# Patient Record
Sex: Male | Born: 1935 | Race: White | Hispanic: No | Marital: Married | Smoking: Never smoker
Health system: Southern US, Community
[De-identification: ages and names within clinical notes are randomized; demographics above are authoritative.]

## PROBLEM LIST (undated history)

## (undated) DIAGNOSIS — J929 Pleural plaque without asbestos: Secondary | ICD-10-CM

## (undated) DIAGNOSIS — E538 Deficiency of other specified B group vitamins: Secondary | ICD-10-CM

## (undated) DIAGNOSIS — M1990 Unspecified osteoarthritis, unspecified site: Secondary | ICD-10-CM

## (undated) DIAGNOSIS — G20A1 Parkinson's disease without dyskinesia, without mention of fluctuations: Secondary | ICD-10-CM

## (undated) DIAGNOSIS — G2581 Restless legs syndrome: Secondary | ICD-10-CM

## (undated) DIAGNOSIS — E785 Hyperlipidemia, unspecified: Secondary | ICD-10-CM

## (undated) DIAGNOSIS — J849 Interstitial pulmonary disease, unspecified: Secondary | ICD-10-CM

## (undated) DIAGNOSIS — K219 Gastro-esophageal reflux disease without esophagitis: Secondary | ICD-10-CM

## (undated) DIAGNOSIS — G20 Parkinson's disease: Secondary | ICD-10-CM

## (undated) DIAGNOSIS — R4189 Other symptoms and signs involving cognitive functions and awareness: Secondary | ICD-10-CM

## (undated) DIAGNOSIS — R413 Other amnesia: Secondary | ICD-10-CM

## (undated) DIAGNOSIS — F513 Sleepwalking [somnambulism]: Principal | ICD-10-CM

## (undated) HISTORY — DX: Parkinson's disease without dyskinesia, without mention of fluctuations: G20.A1

## (undated) HISTORY — PX: COLONOSCOPY: SHX174

## (undated) HISTORY — DX: Deficiency of other specified B group vitamins: E53.8

## (undated) HISTORY — DX: Pleural plaque without asbestos: J92.9

## (undated) HISTORY — DX: Unspecified osteoarthritis, unspecified site: M19.90

## (undated) HISTORY — DX: Hyperlipidemia, unspecified: E78.5

## (undated) HISTORY — DX: Gastro-esophageal reflux disease without esophagitis: K21.9

## (undated) HISTORY — DX: Sleepwalking (somnambulism): F51.3

## (undated) HISTORY — DX: Other amnesia: R41.3

## (undated) HISTORY — DX: Parkinson's disease: G20

## (undated) HISTORY — DX: Other symptoms and signs involving cognitive functions and awareness: R41.89

## (undated) HISTORY — DX: Interstitial pulmonary disease, unspecified: J84.9

## (undated) HISTORY — DX: Restless legs syndrome: G25.81

---

## 1960-10-02 HISTORY — PX: LUNG DECORTICATION: SHX454

## 1999-01-14 ENCOUNTER — Emergency Department (HOSPITAL_COMMUNITY): Admission: EM | Admit: 1999-01-14 | Discharge: 1999-01-14 | Payer: Self-pay | Admitting: Endocrinology

## 2001-10-16 ENCOUNTER — Encounter (INDEPENDENT_AMBULATORY_CARE_PROVIDER_SITE_OTHER): Payer: Self-pay

## 2001-10-16 ENCOUNTER — Ambulatory Visit (HOSPITAL_COMMUNITY): Admission: RE | Admit: 2001-10-16 | Discharge: 2001-10-16 | Payer: Self-pay | Admitting: Gastroenterology

## 2004-11-25 ENCOUNTER — Encounter (INDEPENDENT_AMBULATORY_CARE_PROVIDER_SITE_OTHER): Payer: Self-pay | Admitting: *Deleted

## 2004-11-25 ENCOUNTER — Ambulatory Visit (HOSPITAL_COMMUNITY): Admission: RE | Admit: 2004-11-25 | Discharge: 2004-11-25 | Payer: Self-pay | Admitting: Gastroenterology

## 2006-01-31 ENCOUNTER — Encounter: Payer: Self-pay | Admitting: Endocrinology

## 2006-01-31 ENCOUNTER — Encounter: Payer: Self-pay | Admitting: Emergency Medicine

## 2006-02-26 ENCOUNTER — Emergency Department (HOSPITAL_COMMUNITY): Admission: EM | Admit: 2006-02-26 | Discharge: 2006-02-26 | Payer: Self-pay | Admitting: *Deleted

## 2007-01-07 ENCOUNTER — Ambulatory Visit: Payer: Self-pay | Admitting: Vascular Surgery

## 2008-03-02 HISTORY — PX: CATARACT EXTRACTION: SUR2

## 2008-06-17 ENCOUNTER — Encounter: Payer: Self-pay | Admitting: Cardiovascular Disease

## 2009-05-13 ENCOUNTER — Inpatient Hospital Stay (HOSPITAL_COMMUNITY): Admission: AD | Admit: 2009-05-13 | Discharge: 2009-05-26 | Payer: Self-pay | Admitting: Internal Medicine

## 2009-05-13 ENCOUNTER — Ambulatory Visit: Payer: Self-pay | Admitting: Emergency Medicine

## 2009-05-18 ENCOUNTER — Encounter: Payer: Self-pay | Admitting: Cardiovascular Disease

## 2009-05-18 ENCOUNTER — Encounter: Payer: Self-pay | Admitting: Pulmonary Disease

## 2009-06-01 DIAGNOSIS — J309 Allergic rhinitis, unspecified: Secondary | ICD-10-CM | POA: Insufficient documentation

## 2009-06-01 DIAGNOSIS — K219 Gastro-esophageal reflux disease without esophagitis: Secondary | ICD-10-CM

## 2009-06-01 DIAGNOSIS — E785 Hyperlipidemia, unspecified: Secondary | ICD-10-CM

## 2009-06-01 DIAGNOSIS — M1990 Unspecified osteoarthritis, unspecified site: Secondary | ICD-10-CM | POA: Insufficient documentation

## 2009-06-01 DIAGNOSIS — J8410 Pulmonary fibrosis, unspecified: Secondary | ICD-10-CM

## 2009-06-01 DIAGNOSIS — G2581 Restless legs syndrome: Secondary | ICD-10-CM

## 2009-06-02 ENCOUNTER — Ambulatory Visit: Payer: Self-pay | Admitting: Pulmonary Disease

## 2009-06-02 DIAGNOSIS — J8409 Other alveolar and parieto-alveolar conditions: Secondary | ICD-10-CM

## 2009-06-18 ENCOUNTER — Telehealth: Payer: Self-pay | Admitting: Pulmonary Disease

## 2009-06-21 ENCOUNTER — Telehealth: Payer: Self-pay | Admitting: Pulmonary Disease

## 2009-06-30 ENCOUNTER — Ambulatory Visit: Payer: Self-pay | Admitting: Pulmonary Disease

## 2009-06-30 DIAGNOSIS — R059 Cough, unspecified: Secondary | ICD-10-CM | POA: Insufficient documentation

## 2009-06-30 DIAGNOSIS — R05 Cough: Secondary | ICD-10-CM

## 2009-07-02 ENCOUNTER — Encounter: Payer: Self-pay | Admitting: Pulmonary Disease

## 2009-07-21 ENCOUNTER — Telehealth: Payer: Self-pay | Admitting: Pulmonary Disease

## 2009-07-26 ENCOUNTER — Telehealth: Payer: Self-pay | Admitting: Pulmonary Disease

## 2009-08-30 ENCOUNTER — Ambulatory Visit: Payer: Self-pay | Admitting: Pulmonary Disease

## 2009-08-30 DIAGNOSIS — G4700 Insomnia, unspecified: Secondary | ICD-10-CM

## 2009-11-02 ENCOUNTER — Telehealth (INDEPENDENT_AMBULATORY_CARE_PROVIDER_SITE_OTHER): Payer: Self-pay | Admitting: *Deleted

## 2009-11-03 ENCOUNTER — Telehealth (INDEPENDENT_AMBULATORY_CARE_PROVIDER_SITE_OTHER): Payer: Self-pay | Admitting: *Deleted

## 2009-11-29 ENCOUNTER — Ambulatory Visit: Payer: Self-pay | Admitting: Pulmonary Disease

## 2010-05-30 ENCOUNTER — Ambulatory Visit: Payer: Self-pay | Admitting: Pulmonary Disease

## 2010-07-04 ENCOUNTER — Telehealth (INDEPENDENT_AMBULATORY_CARE_PROVIDER_SITE_OTHER): Payer: Self-pay | Admitting: *Deleted

## 2010-11-03 NOTE — Progress Notes (Signed)
Summary: congestion moving to chest  Phone Note Call from Patient Call back at Home Phone [phone]   Caller: Patient Call For: clance Reason for Call: Talk to Nurse Summary of Call: cold moving to chest, cough with light yellow plegm.  head congestion. Initial call taken by: Eugene Gavia,  November 03, 2009 11:32 AM  Follow-up for Phone Call        see phone note from 11/02/09. Pt states today his cough is productive with  phlegm that is yellow/green. He is using Chlorpheniramine and sinus rinses since yesterday. I advised pt to give thsi more time, but he wanted to get [facility] recs. Will forward to [facility]. Please advise. Carron Curie CMA  November 03, 2009 11:45 AM   Additional Follow-up for Phone Call Additional follow up Details #1::        If he feels that congestion is worsening, and mucus quantity increasing, would call in omnicef 300mg  2 tabs each am for 7 days.  no fills Additional Follow-up by: Barbaraann Share MD,  November 03, 2009 4:57 PM    Additional Follow-up for Phone Call Additional follow up Details #2::    called spoke with patient, who stated that he actually feels better than he did this morning: his congestion has lessened and mucus is now a lighter shade of yellow.  i advised patient of KC's recs and asked him if he would like me to call in the abx to have on hold in case he worsens.  pt stated that he would.  i reitterated to patient to take the abx if his congestion worsens or the amount of mucus increases.  pt verbalized his understanding.  gave verbal order to pharmacist for [facility] with a request to hold it for patient in case he needs it. Follow-up by: Boone Master CNA,  November 03, 2009 5:16 PM  New/Updated Medications: CEFDINIR 300 MG CAPS (CEFDINIR) 2 capsules by mouth  every morning x7 days Prescriptions: CEFDINIR 300 MG CAPS (CEFDINIR) 2 capsules by mouth  every morning x7 days  #14 x 0   Entered by:   Boone Master CNA   Authorized by:   Barbaraann Share MD   Signed by:   Boone Master CNA on 11/03/2009   Method used:   Telephoned to ...       [facility]. [phone]* (retail)       [phone]       [place], [place]  [address]       Ph: [phone]       Fax: [phone]   RxID:   [id number]

## 2010-11-03 NOTE — Progress Notes (Signed)
Summary: chest and head congestion  Phone Note Call from Patient   Caller: Patient Call For: clance Summary of Call: pt have chest and head congestion taking mucinex want to know if there is anythingelse he should be doing Initial call taken by: Rickard Patience,  November 02, 2009 9:00 AM  Follow-up for Phone Call        called and spoke with pt. pt states he started getting a "cold" on Sunday.  Pt states he is taking Mucinex and Alkaseltzer Plus but is coughing up large amounts of clear sputum.  Pt believes this sputum is coming from sinus drainage and not chest.  Pt denied fever or increased sob.  I offered pt an appt today with [facility] at noon.  Pt delined stating he wanted to get [facility] opinion first before making an appt.  Will forward to [facility] to address. Arman Filter LPN  November 02, 2009 9:09 AM   Additional Follow-up for Phone Call Additional follow up Details #1::        PER KC,  can do sinus rinses two times a day and can continue Alkaseltzer Plus because it has an antihistamine (chlorpheniramine) in it or can stop Alkaseltzer plus and just get Chlorpheniramine 8mg  tabs at bedtime OTC.  Pt states he will stop the Alkaseltzer plus and just get Chlorpheniramine 8mg  OTC and will also do sinus rinses.  Pt was instructed to call our office back if symptoms do not improve over the next few days,  symptoms worsen, or if sputum turns green/yellow.  Pt verbalized understanding and denied any questions. Marland KitchenArman Filter LPN  November 02, 2009 9:32 AM

## 2010-11-03 NOTE — Assessment & Plan Note (Signed)
Summary: rov for pulmonary infiltrates   Primary Provider/Referring Provider:  [name]  CC:  Pt is here for a 4 month f/u appt.  Pt states he tried increasing Requip to 1 1/2 tabs but states he went back to 1 tab daily and denied any restless leg symptoms.  Pt states he finished Prednisone taper.  Pt states occ sob with exertion.  Pt states coughing up clear sputum first thing in the am.   .  History of Present Illness: the pt comes in today for f/u of his aveolar pneumopathy superimposed over known chronic lung disease.  He has been tapered off prednisone, and feels that he is maintaining a stable baseline.  He has minimal cough, and although his exertional tolerance is not back to the level before acute event, it is getting there.  He denies any cough or congestion of signficance at this time.  Current Medications (verified): 1)  Aspirin 81 Mg Tbec (Aspirin) .Marland Kitchen.. 1 Once Daily 2)  Tylenol 325 Mg Tabs (Acetaminophen) .... As Directed As Needed 3)  Requip 1 Mg Tabs (Ropinirole Hcl) .Marland Kitchen.. 1 At Bedtime 4)  Vitamin D (Ergocalciferol) 50000 Unit Caps (Ergocalciferol) .Marland Kitchen.. 1 By Mouth Every Week 5)  Flonase 50 Mcg/act Susp (Fluticasone Propionate) .... 2 Sprays Each Nostril At Bedtime  Allergies (verified): 1)  ! Cipro 2)  ! Sulfa 3)  ! * Tussionex  Review of Systems      See HPI  Vital Signs:  Patient profile:   77 year old male Height:      66 inches Weight:      180.50 pounds BMI:     29.24 O2 Sat:      95 % on Room air Temp:     97.6 degrees F oral Pulse rate:   65 / minute BP sitting:   110 / 70  (left arm) Cuff size:   regular  Vitals Entered By: Arman Filter LPN (November 29, 2009 9:51 AM)  O2 Flow:  Room air CC: Pt is here for a 4 month f/u appt.  Pt states he tried increasing Requip to 1 1/2 tabs but states he went back to 1 tab daily and denied any restless leg symptoms.  Pt states he finished Prednisone taper.  Pt states occ sob with exertion.  Pt states coughing  up clear sputum first thing in the am.    Comments Medications reviewed with patient Arman Filter LPN  November 29, 2009 9:51 AM    Physical Exam  General:  ow male in nad Lungs:  left lung clear, right with scattered crackles (chronic) Heart:  rrr Extremities:  no significant edema.   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  His exertional tolerance is not quite back to baseline, but nearing.  He is not coughing, and is off prednisone.  At this point, I have asked him to work on weight loss, and some type of exercise program.  Will see him back in 6mos.  Other Orders: Est. Patient Level II (84132)  Patient Instructions: 1)  followup with me in 6mos.

## 2010-11-03 NOTE — Assessment & Plan Note (Signed)
Summary: rov for inflammatory lung disease   Primary Wesley Pierce/Referring Wesley Pierce:  Wesley Pierce  CC:  Pt is here for a 6 month f/u appt.  Pt states breathing has improved since last visit. Pt states he is able to "hold out a note longer when he is singing."  Pt states mostly non-productive cough but will occ cough up small amounts of clear sputum.  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  He has been treated with a slow prednisone taper, but has been off the medication for some time now.  He feels that he is getting stronger, and clearly has seen an improvement in his endurance.  He has minimal cough, and when it occurs he produces scant clear mucus.  He is happy with his recent progress.  Current Medications (verified): 1)  Aspirin 81 Mg Tbec (Aspirin) .Wesley Pierce.. 1 Once Daily 2)  Tylenol 325 Mg Tabs (Acetaminophen) .... As Directed As Needed 3)  Requip 1 Mg Tabs (Ropinirole Hcl) .Wesley Pierce.. 1 At Bedtime 4)  Vitamin D (Ergocalciferol) 50000 Unit Caps (Ergocalciferol) .Wesley Pierce.. 1 By Mouth Every Week 5)  Flonase 50 Mcg/act Susp (Fluticasone Propionate) .... 2 Sprays Each Nostril At Bedtime 6)  Otc Stool Softner .... Take 1 Tablet By Mouth Once A Day  Allergies (verified): 1)  ! Cipro 2)  ! Sulfa 3)  ! * Tussionex  Review of Systems       The patient complains of productive cough, non-productive cough, and nasal congestion/difficulty breathing through nose.  The patient denies shortness of breath with activity, shortness of breath at rest, coughing up blood, chest pain, irregular heartbeats, acid heartburn, indigestion, loss of appetite, weight change, abdominal pain, difficulty swallowing, sore throat, tooth/dental problems, headaches, sneezing, itching, ear ache, anxiety, depression, hand/feet swelling, joint stiffness or pain, rash, change in color of mucus, and fever.    Vital  Signs:  Patient profile:   77 year old male Height:      66 inches Weight:      178 pounds BMI:     28.83 O2 Sat:      97 % on Room air Temp:     97.5 degrees F oral Pulse rate:   68 / minute BP sitting:   120 / 80  (right arm) Cuff size:   regular  Vitals Entered By: Arman Filter LPN (May 30, 2010 9:31 AM)  O2 Flow:  Room air CC: Pt is here for a 6 month f/u appt.  Pt states breathing has improved since last visit. Pt states he is able to "hold out a note longer when he is singing."  Pt states mostly non-productive cough but will occ cough up small amounts of clear sputum.  Pt c/o nasal congestion. Comments Medications reviewed with patient Arman Filter LPN  May 30, 2010 9:31 AM    Physical Exam  General:  ow male in nad Lungs:  faint right basilar crackles, otherwise clear. Heart:  rrr, 2/6 sem Extremities:  no edema or cyanosis Neurologic:  alert and oriented, moves all 4.   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  Hopefully, this was a one time event, and will not recur.  I have asked him to continue to be active, and to work on some type of conditioning program as much as he can.  He is to followup with me one more time in a year to check on his progress, but understands he is to call if worsening symptoms.  Medications Added to Medication List This Visit: 1)  Otc Stool Softner  .... Take 1 tablet by mouth once a day  Other Orders: Est. Patient Level III ([address])  Patient Instructions: 1)  followup with me in one year. 2)  stay as active as possible.

## 2010-11-03 NOTE — Progress Notes (Signed)
Summary: increased SOB and cough---rx for [facility]  Phone Note Call from Patient Call back at Home Phone [phone]   Caller: Patient Call For: clance Summary of Call: pt is having a little bit of sob and wanted you to know he also got his pnemonia shot last week Initial call taken by: Lacinda Axon,  July 04, 2010 8:55 AM  Follow-up for Phone Call        Called and spoke with pt.  He c/o increased SOB x 1 wk- esp with singing and wih some heavy exertion.  He states that he has also had more cough over the past 2 wks- produces pale yellow sputum every am and then clear small amounts of sputum throughout the day.  He states that sometimes when he is talking alot he "gets two times a day globs of muceus that fills up my mouth".  No openings in [facility] sched until 07/13/10.  Pls advise thanks! Follow-up by: Vernie Murders,  July 04, 2010 9:17 AM  Additional Follow-up for Phone Call Additional follow up Details #1::        lets treat this as a developing bronchitis would call in omnicef 300mg   2 each am for 5 days Additional Follow-up by: Barbaraann Share MD,  July 04, 2010 5:42 PM    Additional Follow-up for Phone Call Additional follow up Details #2::    called and spoke with pt.  pt aware rx sent to pharmacy. Aundra Millet Reynolds LPN  July 04, 2010 5:46 PM   New/Updated Medications: CEFDINIR 300 MG CAPS (CEFDINIR) take 2 tabs by mouth each morning x 5 days Prescriptions: CEFDINIR 300 MG CAPS (CEFDINIR) take 2 tabs by mouth each morning x 5 days  #10 x 0   Entered by:   Arman Filter LPN   Authorized by:   Barbaraann Share MD   Signed by:   Arman Filter LPN on 09/81/1914   Method used:   Electronically to        [facility]. [phone]* (retail)       [phone]       [place], [place]  [address]       Ph: [phone]       Fax: [phone]   RxID:   [id number]

## 2010-12-18 IMAGING — CR DG CHEST 2V
2 series · 2 of 2 positions shown · non-contrast
Comparison: 05/14/2009 and 05/17/2009

CLINICAL DATA: Shortness of breath.

CHEST - 2 VIEW

[w chest pa]
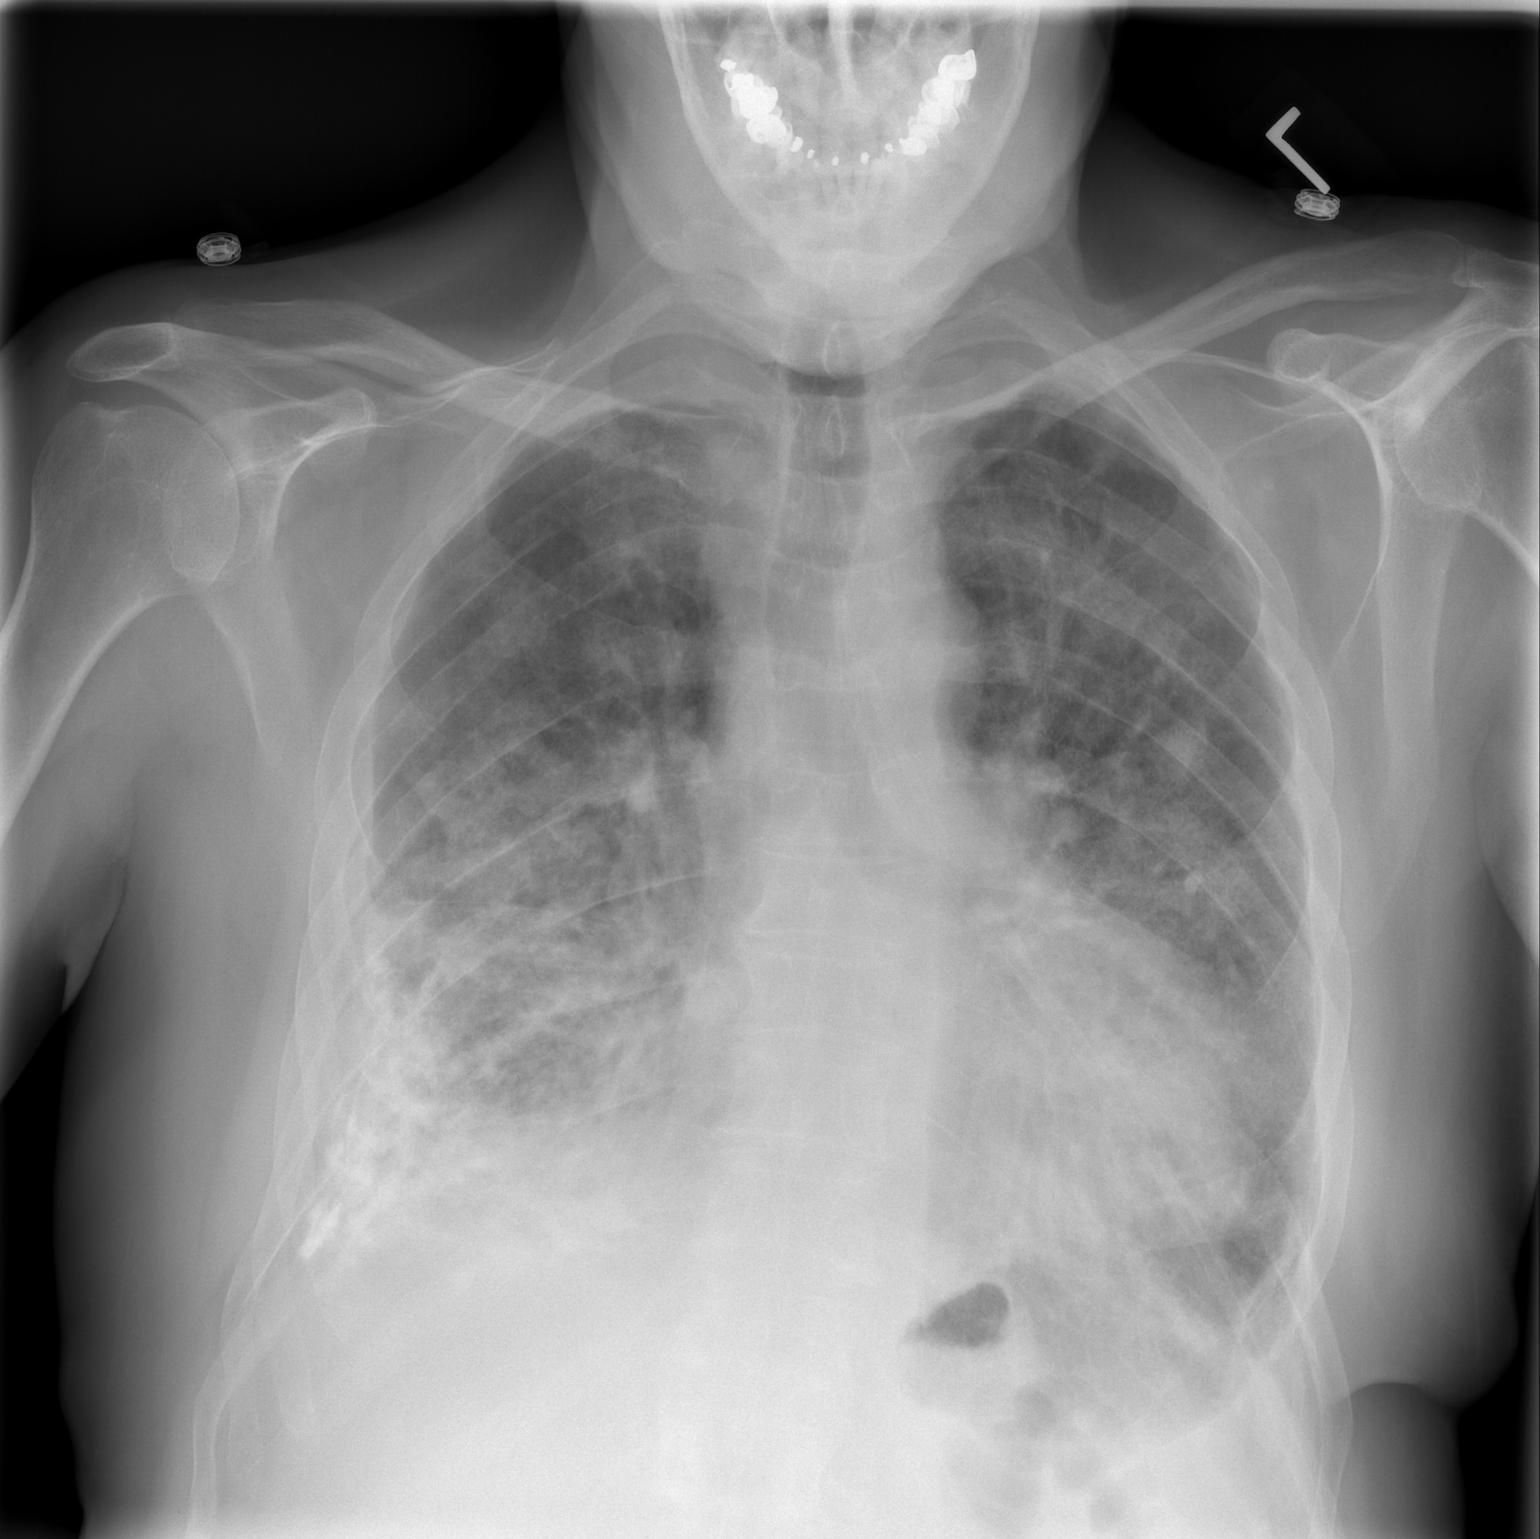

[w chest lat]
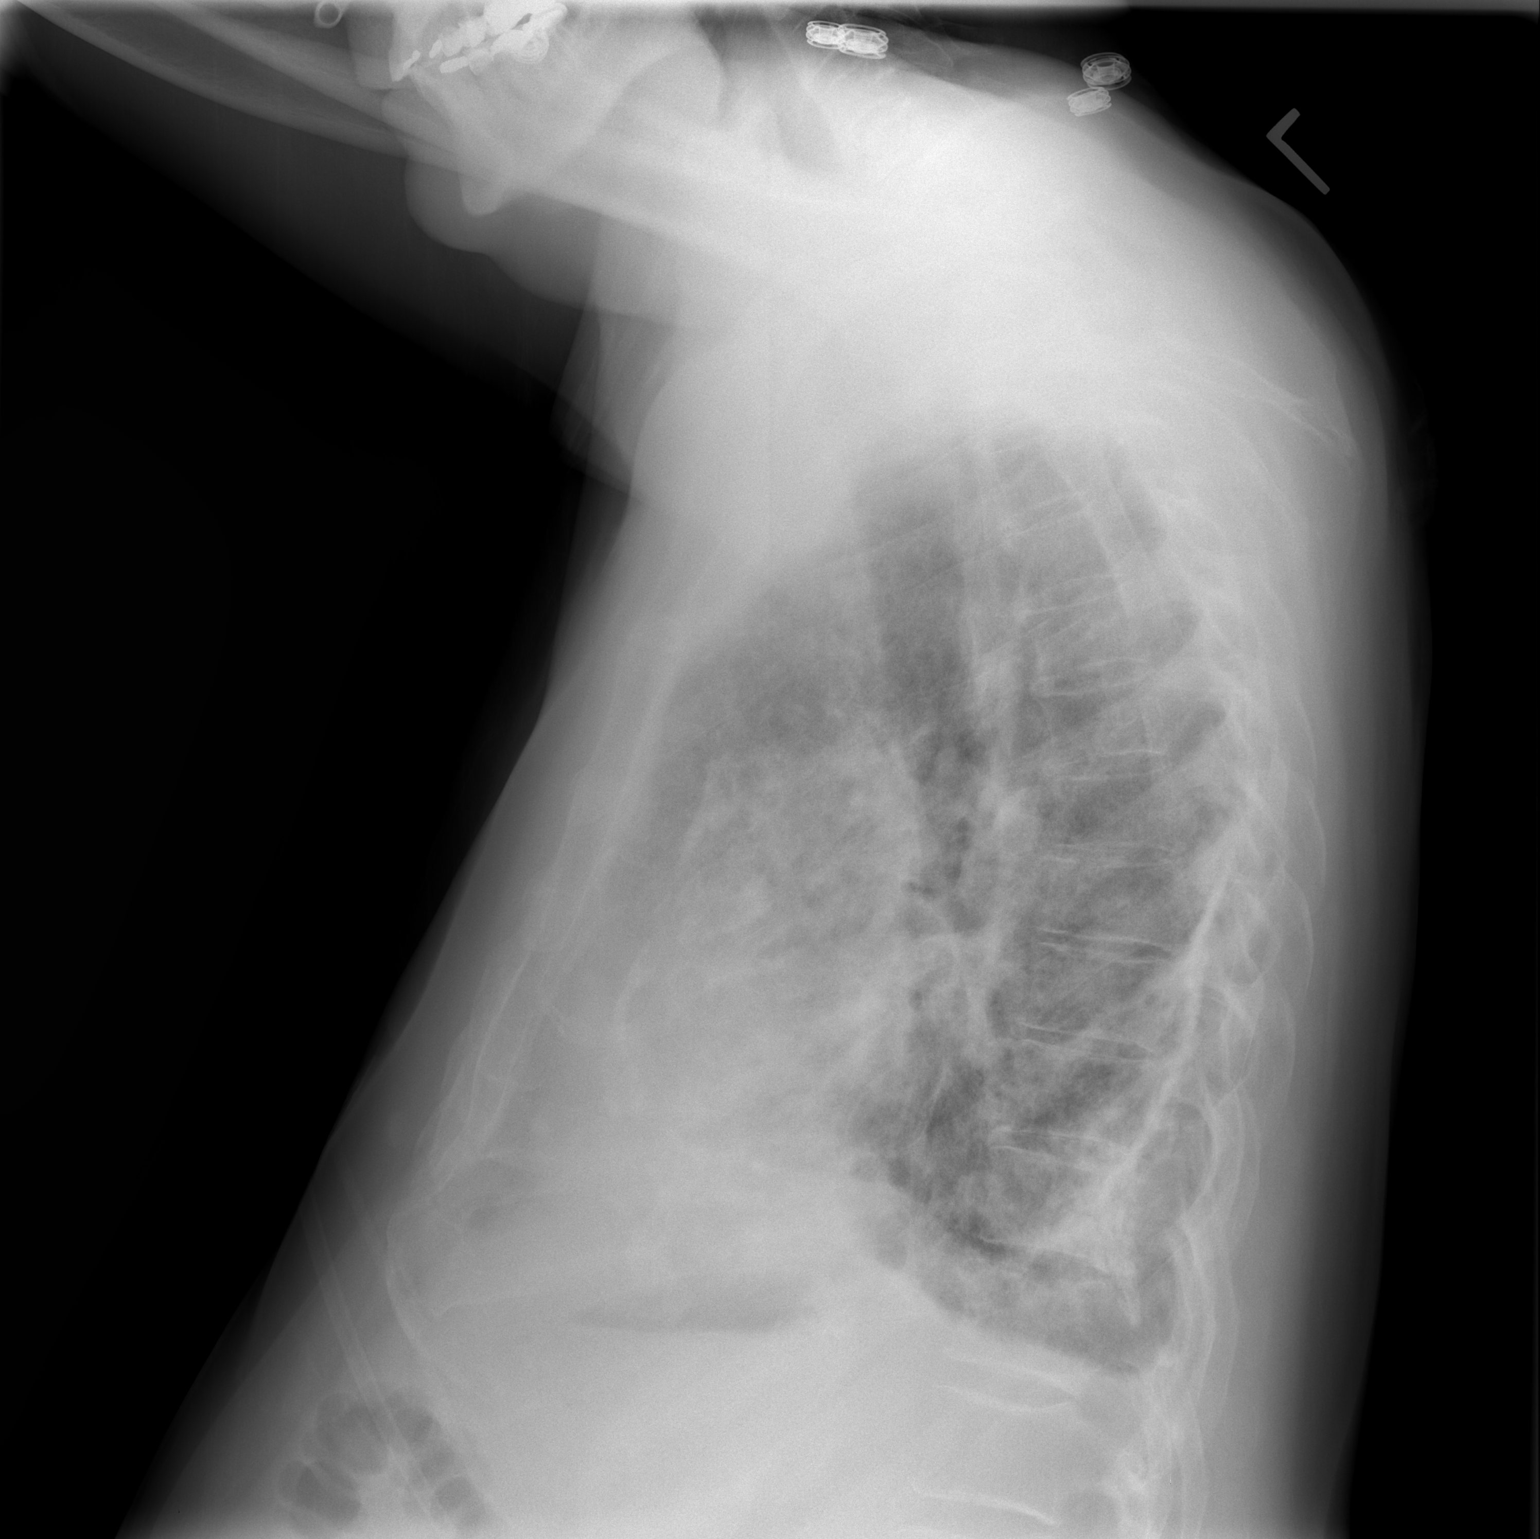

[2 of 2 positions shown; findings below may reference images not displayed]

FINDINGS: The heart is mildly enlarged but stable.  The mediastinal
and hilar contours are stable.  Persistent diffuse nodular airspace
process along with probable chronic underlying changes of pulmonary
fibrosis and dense pleural calcification and thickening on the
right side which may be the sequela of previous empyema or trauma.
Overall, slight improved upper lung zone aeration bilaterally.
IMPRESSION: 1.  Chronic underlying pulmonary fibrosis and extensive right-sided
pleural calcifications and thickening.
2.  Diffuse nodular airspace process which may be acute on chronic
change.  Chest CT may be helpful for further evaluation.

## 2010-12-20 IMAGING — CR DG CHEST 1V PORT
1 series · 1 of 1 positions shown · non-contrast
Comparison: Two-view chest x-ray [DATE]/[id number], 816, 4747, and
05/14/2009.

CLINICAL DATA: Follow up pulmonary edema superimposed upon chronic
lung changes.  Cough.  Shortness of breath.

PORTABLE CHEST - 1 VIEW [DATE]/[id number] hours:

[view not recorded]
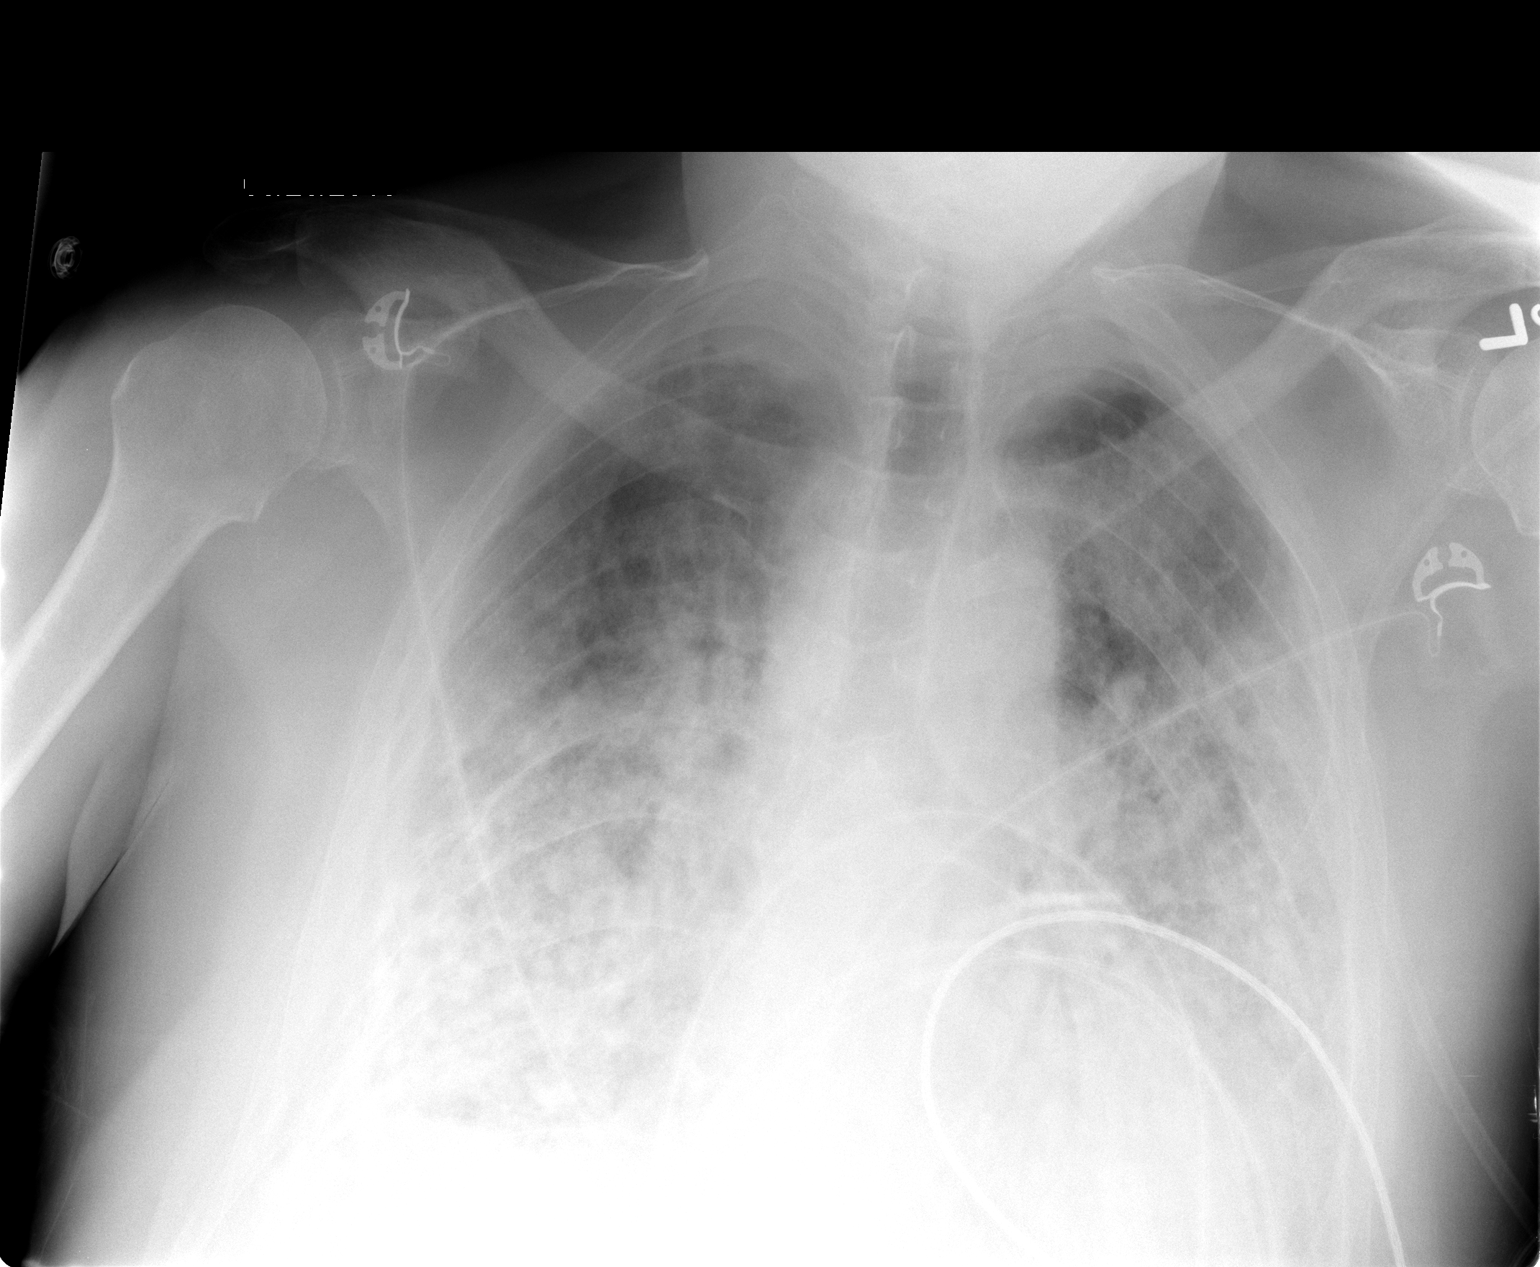

[1 of 1 positions shown; findings below may reference images not displayed]

FINDINGS: Heart enlarged but stable.  Worsening airspace opacities
throughout both lungs, superimposed upon the baseline chronic
interstitial lung disease.  Calcified right fibrothorax again
noted.
IMPRESSION: Worsening airspace pulmonary edema throughout both lungs.

## 2011-01-07 LAB — BASIC METABOLIC PANEL
BUN: 12 mg/dL (ref 6–23)
CO2: 29 mEq/L (ref 19–32)
Calcium: 8.3 mg/dL — ABNORMAL LOW (ref 8.4–10.5)
Calcium: 8.6 mg/dL (ref 8.4–10.5)
Chloride: 101 mEq/L (ref 96–112)
Chloride: 104 mEq/L (ref 96–112)
Creatinine, Ser: 0.6 mg/dL (ref 0.4–1.5)
Creatinine, Ser: 0.73 mg/dL (ref 0.4–1.5)
GFR calc Af Amer: >60 mL/min (ref >60)
GFR calc Af Amer: >60 mL/min (ref >60)
GFR calc Af Amer: >60 mL/min (ref >60)
GFR calc non Af Amer: >60 mL/min (ref >60)
GFR calc non Af Amer: >60 mL/min (ref >60)
Potassium: 3.8 mEq/L (ref 3.5–5.1)
Sodium: 133 mEq/L — ABNORMAL LOW (ref 135–145)
Sodium: 138 mEq/L (ref 135–145)

## 2011-01-07 LAB — SEDIMENTATION RATE
Sed Rate: 119 mm/hr — ABNORMAL HIGH (ref 0–16)
Sed Rate: 83 mm/hr — ABNORMAL HIGH (ref 0–16)

## 2011-01-07 LAB — BLOOD GAS, ARTERIAL
Acid-Base Excess: 5.5 mmol/L — ABNORMAL HIGH (ref 0.0–2.0)
Drawn by: 308601
Drawn by: 319961
O2 Content: 4 L/min
O2 Content: 6 L/min
Patient temperature: 98.6
pCO2 arterial: 35.8 mmHg (ref 35.0–45.0)
pCO2 arterial: 37.2 mmHg (ref 35.0–45.0)
pH, Arterial: 7.474 — ABNORMAL HIGH (ref 7.350–7.450)
pH, Arterial: 7.498 — ABNORMAL HIGH (ref 7.350–7.450)
pO2, Arterial: 50.3 mmHg — ABNORMAL LOW (ref 80.0–100.0)

## 2011-01-07 LAB — CBC
HCT: 35.3 % — ABNORMAL LOW (ref 39.0–52.0)
HCT: 36 % — ABNORMAL LOW (ref 39.0–52.0)
Hemoglobin: 10.9 g/dL — ABNORMAL LOW (ref 13.0–17.0)
MCHC: 33.6 g/dL (ref 30.0–36.0)
MCHC: 34.1 g/dL (ref 30.0–36.0)
MCHC: 34.2 g/dL (ref 30.0–36.0)
MCV: 89.6 fL (ref 78.0–100.0)
Platelets: 410 10*3/uL — ABNORMAL HIGH (ref 150–400)
RBC: 3.54 MIL/uL — ABNORMAL LOW (ref 4.22–5.81)
RBC: 3.94 MIL/uL — ABNORMAL LOW (ref 4.22–5.81)
RDW: 13.6 % (ref 11.5–15.5)
RDW: 13.7 % (ref 11.5–15.5)
WBC: 17.5 10*3/uL — ABNORMAL HIGH (ref 4.0–10.5)

## 2011-01-07 LAB — CLOSTRIDIUM DIFFICILE EIA: C difficile Toxins A+B, EIA: NEGATIVE

## 2011-01-07 LAB — CARDIAC PANEL(CRET KIN+CKTOT+MB+TROPI)
CK, MB: 1.9 ng/mL (ref 0.3–4.0)
Relative Index: INVALID (ref 0.0–2.5)
Total CK: 49 U/L (ref 7–232)

## 2011-01-07 LAB — MPO/PR-3 (ANCA) ANTIBODIES

## 2011-01-07 LAB — HYPERSENSITIVITY PNUEMONITIS PROFILE
A pullulans: NOT DETECTED
Thermoactinomyces vulgaris, IgG: NOT DETECTED

## 2011-01-07 LAB — DIFFERENTIAL
Basophils Absolute: 0 10*3/uL (ref 0.0–0.1)
Basophils Relative: 0 % (ref 0–1)
Eosinophils Absolute: 0.3 10*3/uL (ref 0.0–0.7)
Eosinophils Relative: 2 % (ref 0–5)
Lymphocytes Relative: 6 % — ABNORMAL LOW (ref 12–46)

## 2011-01-07 LAB — HIV ANTIBODY (ROUTINE TESTING W REFLEX): HIV: NONREACTIVE

## 2011-01-07 LAB — BRAIN NATRIURETIC PEPTIDE: Pro B Natriuretic peptide (BNP): 123 pg/mL — ABNORMAL HIGH (ref 0.0–100.0)

## 2011-01-07 LAB — IGE: IgE (Immunoglobulin E), Serum: 7.9 IU/mL (ref 0.0–180.0)

## 2011-01-07 LAB — ANTI-SCLERODERMA ANTIBODY: Scleroderma (Scl-70) (ENA) Antibody, IgG: <0.2 AI (ref <1.0)

## 2011-01-08 LAB — CLOSTRIDIUM DIFFICILE EIA

## 2011-01-08 LAB — CBC
Hemoglobin: 13.1 g/dL (ref 13.0–17.0)
RBC: 4.38 MIL/uL (ref 4.22–5.81)
WBC: 14 10*3/uL — ABNORMAL HIGH (ref 4.0–10.5)

## 2011-01-08 LAB — URINALYSIS, ROUTINE W REFLEX MICROSCOPIC
Hgb urine dipstick: NEGATIVE
Leukocytes, UA: NEGATIVE
Protein, ur: 30 mg/dL — AB
Specific Gravity, Urine: 1.017 (ref 1.005–1.030)
Urobilinogen, UA: 1 mg/dL (ref 0.0–1.0)

## 2011-01-08 LAB — COMPREHENSIVE METABOLIC PANEL
ALT: 20 U/L (ref 0–53)
AST: 34 U/L (ref 0–37)
Alkaline Phosphatase: 67 U/L (ref 39–117)
CO2: 25 mEq/L (ref 19–32)
GFR calc Af Amer: >60 mL/min (ref >60)
GFR calc non Af Amer: >60 mL/min (ref >60)
Glucose, Bld: 123 mg/dL — ABNORMAL HIGH (ref 70–99)
Potassium: 4.2 mEq/L (ref 3.5–5.1)
Sodium: 126 mEq/L — ABNORMAL LOW (ref 135–145)

## 2011-01-08 LAB — CULTURE, BLOOD (ROUTINE X 2)
Culture: NO GROWTH
Culture: NO GROWTH

## 2011-01-08 LAB — LEGIONELLA PNEUMOPHILA ANTIBODIES: Legionella pneumo, IgG Type 1-6: <1:128 {titer}

## 2011-01-08 LAB — CARDIAC PANEL(CRET KIN+CKTOT+MB+TROPI)
CK, MB: 2.2 ng/mL (ref 0.3–4.0)
Total CK: 120 U/L (ref 7–232)

## 2011-01-08 LAB — APTT: aPTT: 27 seconds (ref 24–37)

## 2011-01-08 LAB — URINE MICROSCOPIC-ADD ON

## 2011-02-14 NOTE — H&P (Signed)
[name], [name] NO.:  [id number]   MEDICAL RECORD NO.:  [id number]          PATIENT TYPE:  INP   LOCATION:  1342                         FACILITY[facility]  [facility]   PHYSICIAN:  [name], M.D.  DATE OF BIRTH:  1931-03-14   DATE OF ADMISSION:  05/13/2009  DATE OF DISCHARGE:                              HISTORY & PHYSICAL   CHIEF COMPLAINT:  Cough, shortness of breath and weakness.   HISTORY OF PRESENT ILLNESS:  [name] is a very pleasant 77 year old  gentleman well-known to myself with chronic pleural and pulmonary  parenchymal disease, hyperlipidemia, allergies, presenting at this time  with a 2- to 45-month picture of failure to thrive most notable, several  days of fever, cough and congestion.  He was seen earlier this year in  January 2010 doing quite well, his chronic pulmonary disease under  excellent control.  He has remained fairly active but noteworthy, he has  had chronic pulmonary disease as he had a lung decortication back in  1962.  He first presented with the current illness April 20, 2009  complaining of shortness of breath, tightness in the chest cough and  phlegm.  He was having no fevers at that time and his chest radiograph,  which is chronically abnormal, had not changed.  He was treated  empirically with Levaquin 500 mg daily x7 days and a steroid Dosepak for  12 days.  He did quite well from a pulmonary standpoint but on April 27, 2009 did have some difficulty with belching and reflux treated  empirically with Aciphex.  He also saw Dr. Elease Hashimoto that day to rule out  cardiac etiology and he was cleared.  He re-presents on May 10, 2009  with continued fatigue, what appears to be a reoccurrence of his  pulmonary symptoms, now with fever, chills and failure to thrive-type  symptoms.  The cough has returned as has the dyspnea upon exertion.  O2  saturation was 93%, CT scan was ordered and he was put empirically on  Levaquin 750 daily as well  as continue proton pump inhibitor.  Subsequent CT scan that has been sent to the hospital conducted at [facility]  Imaging did reveal extensive course interstitial opacities, right lung  base pleural-based nodular lesions, course peripheral calcifications and  multifocal nonspecific regions of ground glass opacities as well,  predominately in the upper lobes.  It certainly remains unclear, given  his clinical picture, as to whether this is an inflammatory interstitial  process, underlying malignancy or occult atypical pneumonia.  While  nontoxic, he does have continued fevers, weakness, poor p.o. intake and  has clearly failed yet another course of antibiotics.  He is admitted  for IV therapies, bronchodilators and further management.   ALLERGIES:  No known drug allergies.   MEDICATIONS:  1. Baby aspirin daily.  2. Tylenol p.r.n.  3. Requip 1 mg q.h.s.  4. Vitamin D 50,000 units 1 time weekly.  5. Flonase 2 sprays each naris q.h.s.  6. Ambien 5 p.o. q.h.s. p.r.n. insomnia.  7. Flovent inhaler 110 two inhalations b.i.d.  8. Aciphex 20 mg daily.  9.  Ventolin inhaler 2 puffs q.i.d.   MEDICAL ILLNESSES:  Include allergies/allergic rhinitis, osteoarthritis,  reactive airway disease, restless leg syndrome, hyperlipidemia.   SURGICAL ILLNESSES:  Include cataracts June 2009, lung decortication  1962, colonoscopy 2003 and 2006.   FAMILY HISTORY:  Noncontributory.   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  Does not smoke or drink.  He has 2 children and 3 grandchildren.   PHYSICAL EXAMINATION:  VITAL SIGNS:  Temperature is currently 98.5, per  wife he has been up to 100.5, blood pressure 140/70, pulse 90,  respiratory rate 18, O2 saturation is 90% resting.  GENERAL:  The patient is pale, weak no distress.  HEENT:  Good facial symmetry.  Anicteric, nontoxic.  LUNGS:  Clear with diminished breath sounds in the right.   No wheezing,  rales or rhonchi.  CARDIOVASCULAR:  Distant heart sounds, regular rate and rhythm, no  murmur, gallop, rub or heave.  BREASTS:  Negative.  ABDOMEN:  Soft, nontender.  No hepatosplenomegaly.  EXTREMITIES:  Without cyanosis, clubbing or edema.  Intact distal  pulses.  NEUROLOGICALLY:  Nonfocal.   ASSESSMENT:  1. Chronic parenchymal and pleural disease/reactive airway disease now      with superimposed failure to thrive and fevers.  Considerations      include resistant atypical pneumonia versus inflammatory      interstitial process versus malignancy, clearly not managed well as      an outpatient at this point.  2. Allergies.  3. Hyperlipidemia.  4. Osteoarthritis.   PLAN:  The patient will be admitted empirically for antibiotics to  include Zosyn and Zithromax.  He will be continued on bronchodilators,  hydrated, pulmonary consult for possible bronchoscopy.  PPD has been  placed as well.           ______________________________  [name], M.D.     RA/MEDQ  D:  05/13/2009  T:  05/13/2009  Job:  [id number]   cc:   [facility]

## 2011-02-14 NOTE — Discharge Summary (Signed)
Wesley Pierce, MACPHERSON NO.:  [id number]   MEDICAL RECORD NO.:  [id number]          PATIENT TYPE:  INP   LOCATION:  1311                         FACILITY:  [facility]   PHYSICIAN:  [name], M.D.  DATE OF BIRTH:  [birth year]   DATE OF ADMISSION:  05/13/2009  DATE OF DISCHARGE:  05/26/2009                               DISCHARGE SUMMARY   DIAGNOSES AT THE TIME OF DISCHARGE:  1. Acute respiratory failure.  2. ILD with left pleural decortication.  3. Restless legs syndrome.  4. Gastroesophageal reflux disease.  5. Allergies.  6. Hyperlipidemia.  7. Osteoarthritis.   HISTORY OF PRESENT ILLNESS:  Wesley Pierce is a 77 year old gentleman  with chronic pulmonary disease, largely resulting from decortication  years prior and elements of obstructive pulmonary disease,  hyperlipidemia, presenting at this time with progressive dyspnea, cough  and weakness.  He had had a fairly extensive several-week course leading  up to the ultimate hospitalization with interventions to include  antibiotics for empiric treatment for underlying pneumonia as well as a  trial of steroids.  While he initially improved, he once again declined  resulting in the admission on August 12.  Also in the interim he had  seen Dr. Elease Hashimoto from cardiology and had a cardiac evaluation with the  feeling that there was no underlying cardiac disease contributing.  In  fact, he had had a stress Cardiolite test performed less than 1 year  prior with normal left ventricular systolic function and no evidence of  ischemia.  He was admitted at this time for IV antibiotics and  ultimately decisions with regards to tissue versus empiric steroids.  For details, see the dictated summary on the chart   DATA:  Initial chest x-ray revealed severe parenchymal lung disease,  partially chronic, partially acute, suspicious for pneumonia or  pulmonary edema.  On August 23 he had a follow-up, which revealed  improved  interstitial disease but moderate airspace opacities  persisting.  Noteworthy, he had had a CT scan of the chest done as an  outpatient.  Echocardiogram was done and this revealed normal wall  thickness, systolic function vigorous, EF 65-70%, grade 1 diastolic  dysfunction but no concerns.  C. difficile during the hospitalization  was done x2, negative.  A sedimentation rate at one point was 110.  It  was 45 prior to discharge.  BNP was 285.  Ultimately following some  empiric doses of Lasix, BNP dropped to about 123.  Chemistry:  Sodium  133, potassium 3.2, chloride 101, CO2 27, glucose 110, creatinine is  0.6.  CBC:  Hemoglobin 10.9, hematocrit 31.5, platelet count 428,000,  white blood cell count 9.6.   HOSPITAL COURSE:  The patient was initially admitted to the floor,  placed empirically on Zosyn and IV Zithromax as well as continuation of  Flovent inhaler and albuterol nebulizers.  Pulmonary was consulted.  The  patient failed to improve and ultimately declined, resulting in transfer  to the step-down unit for monitoring and the initiation of increasing  oxygen up to rebreather at times.  In conjunction with Dr. Sherene Sires of  pulmonary, we continued  to weigh the benefit of possible VATS or  bronchoscopy versus empiric steroids, and it was felt that the patient  had a high risk of ventilatory support and poor outcome with  intervention and that empiric steroids were probably best attempted  given the potential for reversibility and finding anything with more  aggressive interventions.  As such, he was started empirically on Solu-  Medrol 80 q.12 h.  He did receive approximately 2 doses of IV Lasix.  Dr. Elease Hashimoto did see him in consultation and felt as though nothing with  regards to cardiac status was underlying this.  He did have a brief run  of atrial fibrillation treated with Cardizem and digoxin.  Ultimately he  was transitioned to Xopenex and oral steroids with excellent results.   Chest x-ray gradually cleared.  He is down to a 2-liter nasal cannula,  ambulating with minimal dyspnea.  I did have an extensive discussion  with the patient with regards to empiric steroids and lack of full  understanding of underlying pathophysiology.  Pulmonary did draw fairly  a extensive rheumatologic battery prior to discharge, which will return  as an outpatient.  The patient is ambulating, comfortable, no dyspnea,  on oxygen.  He is clinically much improved.  He did finish a full 14-day  course of antibiotics.  He is currently on steroids and doing well and  will follow up as an outpatient.   DISCHARGE MEDICATIONS:  1. Requip 1 mg q.h.s.  2. Protonix 40 mg p.o. b.i.d.  3. Aspirin 81 mg daily.  4. Xopenex inhaler 2 puffs q.i.d.  5. Prednisone 40 mg b.i.d.  6. Oxygen 2 liters nasal cannula.  7. Mucinex 600 twice daily.  8. Tylenol 650 q.4 h. p.r.n. pain.   He is due to follow up with Dr. Shelle Iron of pulmonary medicine in  approximately 5 days, September 1, 10:45 a.m., and he will follow up  with Dr. Jacky Kindle in 2-3 weeks.  He will advance diet and activity as  able.  I have cautioned him about strenuous activity, dust and  environmental challenges.           ______________________________  [name], M.D.     RA/MEDQ  D:  05/26/2009  T:  05/26/2009  Job:  [id number]   cc:   Barbaraann Share, MD,FCCP  [address]  [place]  [place] [address]   Charlaine Dalton. Sherene Sires, MD, FCCP  [address]  [place] [address]

## 2011-02-14 NOTE — Consult Note (Signed)
Wesley Pierce NO.:  [id number]   MEDICAL RECORD NO.:  [id number]          PATIENT TYPE:  INP   LOCATION:  1234                         FACILITY:  [facility]   PHYSICIAN:  Vesta Mixer, M.D. DATE OF BIRTH:  Aug 11, 1931   DATE OF CONSULTATION:  05/18/2009  DATE OF DISCHARGE:                                 CONSULTATION   Wesley Pierce is a 77 year old gentleman who is admitted to the  hospital with progressive dyspnea.   Wesley Pierce is a 77 year old gentleman with a history of shortness of  breath several months ago.  He initially noted that he was short of  breath while singing in the choir.  He used to be able to sing quite  well, but noticed he was running out of air before the end of the line.  This has been gradually worsening.  He has seen Dr. Jacky Kindle and has had  several courses of steroids and several courses of antibiotics.  This  really helped only temporarily.  He progressively worsened and was  admitted to the hospital on August 12.   Since that time, he has had progressive dyspnea and has also had some  hypoxemia during the hospitalization.  We were asked to see him today  for cardiac evaluation.   He has had a history of some chest pain and shortness of breath in the  past.  Stress Cardiolite study performed last year revealed normal left  ventricular systolic function.  He had no evidence of ischemia.   CURRENT MEDICATIONS:  Aspirin once a day.  Lovenox twice a day.  Flovent  once a day.  Zosyn.  Zithromax.   ALLERGIES:  He has no known drug allergies.   PAST MEDICAL HISTORY:  1. COPD.  2. Hyperlipidemia.   SOCIAL HISTORY:  The patient is a nonsmoker, nondrinker.  The patient is  a former Music therapist.   FAMILY HISTORY:  Negative for cardiac disease.   REVIEW OF SYSTEMS:  Consistent with failure to thrive.  He has had  dyspnea.  He denies any syncope.  Denies chest pain.  All other systems  were reviewed and are negative.   EXAM:   CONSTITUTIONAL:  He is an elderly gentleman in no acute distress.  VITAL SIGNS:  Temperature is 98.4, blood pressure 119/72 with a heart  rate of 78.  HEENT:  Reveals 2+ carotids.  He has no bruits, JVD, thyromegaly.  Sclerae anicteric.  Mucous membranes are moist.  Neck is supple.  LUNGS:  Exam reveals consolidation of the left upper lung field.  He has  a few basilar rales bilaterally.  HEART:  Regular rate.  S1, S2.  His PMI is nondisplaced.  Chest wall is  nontender.  ABDOMEN:  Good bowel sounds.  There is no hepatosplenomegaly.  He has no  guarding or rebound.  EXTREMITIES:  He has no cyanosis, clubbing, or edema.  There are no  palpable cords.  He has no rash or skin nodules.  NEURO:  Nonfocal.   LABORATORY DATA:  His BNP is 123.  His chest x-ray revealed left upper  lobe pneumonia.  He  has bilateral chronic scarring.  His potassium is  3.2.  Otherwise, his electrolytes are unremarkable.  His white blood  cell count is 9.6, hemoglobin 10.9, hematocrit 31.9.   ASSESSMENT AND PLAN:  Wesley Pierce presents with progressive dyspnea.  This seems to be primarily a pulmonary issue.  His main symptom is  running out of air when he is singing.  He does not have any episodes of  chest pain or syncope.   We performed an echocardiogram today.  This reveals vigorous left  ventricular systolic function.  He does have some impaired left  ventricular diastolic filling, although this is very minor.  He also has  trivial aortic insufficiency and trivial tricuspid regurgitation.   At this point, I would continue with aggressive pulmonary therapy.  There is some discussion about video-assisted lung biopsy.  Dr. Sherene Sires has  also suggested a possible course of steroids.  We will continue to  follow along as needed.      Vesta Mixer, M.D.  Electronically Signed     PJN/MEDQ  D:  05/18/2009  T:  05/18/2009  Job:  [id number]   cc:   [name], M.D.  Fax: [phone]   Charlaine Dalton. Sherene Sires,  MD, FCCP  [address]  [place] [address]

## 2011-02-17 NOTE — Op Note (Signed)
NAME:  Wesley Pierce, Wesley Pierce            ACCOUNT NO.:  [id number]   MEDICAL RECORD NO.:  [id number]          PATIENT TYPE:  AMB   LOCATION:  ENDO                         FACILITY:  [facility]   PHYSICIAN:  John C. Madilyn Fireman, M.D.    DATE OF BIRTH:  [birth year]   DATE OF PROCEDURE:  11/25/2004  DATE OF DISCHARGE:  11/25/2004                                 OPERATIVE REPORT   PROCEDURE:  Colonoscopy with polypectomy.   ENDOSCOPIST:  Everardo All. Madilyn Fireman, M.D.   INDICATIONS FOR PROCEDURE:  History of adenomatous colon polyps, due for  surveillance.   PROCEDURE:  The patient was placed in the left lateral decubitus position  and placed on the pulse monitor with continuous low-flow oxygen delivered by  nasal cannula.  He was sedated with 100 mcg IV fentanyl and 8 mg IV Versed.  The Olympus video colonoscope was inserted into the rectum and advanced to  cecum, confirmed by transillumination of McBurney's point and visualization  of ileocecal valve and appendiceal orifice.  The prep was good.  The cecum  appeared normal with no masses, polyps, diverticula or other mucosal  abnormalities.  In the ascending colon there were seen 2 small 6-mm polyps  that were fulgurated by hot biopsy.  The remainder of the ascending,  transverse, and descending colon appeared normal.  At the junction between  the descending and sigmoid colon, there was an 8-mm sessile polyp that was  removed by snare.  There were a few scattered diverticula, but no polyps  noted in the remainder of the sigmoid and rectum.  Scope was then withdrawn  and the patient returned to the recovery room in stable condition.  He  tolerated the procedure well and there were no immediate complications.   IMPRESSION:  1.  Ascending and transverse colon polyps.  2.  Diverticulosis.   PLAN:  Await histology to determine interval for next colonoscopy.      JCH/MEDQ  D:  12/21/2004  T:  12/22/2004  Job:  [id number]

## 2011-02-17 NOTE — Op Note (Signed)
NAME:  Wesley Pierce, Wesley Pierce            ACCOUNT NO.:  [id number]   MEDICAL RECORD NO.:  [id number]          PATIENT TYPE:  AMB   LOCATION:  ENDO                         FACILITY:  [facility]   PHYSICIAN:  John C. Madilyn Fireman, M.D.    DATE OF BIRTH:  01-26-1931   DATE OF PROCEDURE:  11/25/2004  DATE OF DISCHARGE:                                 OPERATIVE REPORT   PROCEDURE:  Colonoscopy with polypectomy.   INDICATIONS FOR PROCEDURE:  History of adenomatous colon polyps on  colonoscopy 3 years ago.   DESCRIPTION OF PROCEDURE:  The patient was placed in the left lateral  decubitus position then placed on the pulse monitor with continuous low-flow  oxygen delivered by nasal cannula. He was sedated with 100 mcg IV fentanyl  and  8 mg IV Versed. The Olympus video colonoscope was inserted into the  rectum and advanced to cecum, confirmed by transillumination at McBurney's  point visualization at the ileocecal valve and appendiceal orifice. Prep was  good. The cecum appeared normal with no masses, polyps, diverticula or other  mucosal abnormalities. Within the ascending colon, there were seen two small  8 mm sessile polyps that were fulgurated by hot biopsy. The remainder of the  ascending and transverse colon appeared normal. Within the descending colon,  there was a 1-cm sessile polyp that was removed by snare.  In the descending  and sigmoid colon, there were several scattered diverticula. The scope was  then withdrawn and the patient returned to the recovery room in stable  condition. He tolerated the procedure well and there were no immediate  complications.   IMPRESSION:  1.  Ascending and descending colon polyps.  2.  Sigmoid diverticulosis.   PLAN:  Await histology to determine interval for next colonoscopy.      JCH/MEDQ  D:  11/25/2004  T:  11/25/2004  Job:  [id number]   cc:   [name], M.D.  [address]  [place]  [place] [address]  Fax: [phone]

## 2011-02-17 NOTE — Op Note (Signed)
NAME:  BEAU, RAMSBURG            ACCOUNT NO.:  [id number]   MEDICAL RECORD NO.:  [id number]          PATIENT TYPE:  AMB   LOCATION:  ENDO                         FACILITY:  [facility]   PHYSICIAN:  John C. Madilyn Fireman, M.D.    DATE OF BIRTH:  07/08/1931   DATE OF PROCEDURE:  11/25/2004  DATE OF DISCHARGE:                                 OPERATIVE REPORT   PROCEDURE:  Colonoscopy with polypectomy.   INDICATIONS FOR PROCEDURE:  Dictation stopped at this point.      JCH/MEDQ  D:  11/25/2004  T:  11/25/2004  Job:  [id number]   cc:   [name], M.D.  [address]  [place]  [place] [address]  Fax: [phone]

## 2011-02-17 NOTE — Op Note (Signed)
[facility]  Patient:    PRESS, CASALE Visit Number: [id number] MRN: [id number]          Service Type: END Location: ENDO Attending Physician:  Louie Bun Dictated by:   Everardo All Madilyn Fireman, M.D. Admit Date:  10/16/2001   CC:         Richard A. Jacky Kindle, M.D.   Operative Report  PROCEDURE:  Colonoscopy with polypectomy.  INDICATION FOR PROCEDURE:  Screening colonoscopy in a 77 year old patient with no previous colon screening.  DESCRIPTION OF PROCEDURE:  The patient was placed in the left lateral decubitus position and placed on the pulse monitor with continuous low flow oxygen delivered via nasal cannula. He was sedated with 60 mg IV Demerol and 5 mg IV Versed. The Olympus video colonoscope was inserted into the rectum and advanced to the cecum, confirmed by transillumination at McBurneys point and visualization of the ileocecal valve and appendiceal orifice. The prep was excellent. The cecum and ascending colon appeared normal. Within the transverse colon there was seen a 6-mm polyp which was removed by hot biopsy. The remainder of the transverse, descending, and sigmoid appeared normal. Within the rectum, there was seen an 8-mm pedunculated polyp which was removed by snare. The colonoscope was then withdrawn and the patient returned to the recovery room in stable condition. He tolerated the procedure well and there were no immediate complications.  IMPRESSION:  Two colon polyps.  PLAN:  Await histology to determine interval and method for future colon screening. Dictated by:   Everardo All Madilyn Fireman, M.D. Attending Physician:  Louie Bun DD:  10/16/01 TD:  10/17/01 Job: [id number]

## 2011-02-24 ENCOUNTER — Ambulatory Visit (INDEPENDENT_AMBULATORY_CARE_PROVIDER_SITE_OTHER): Payer: Medicare Other | Admitting: Adult Health

## 2011-02-24 ENCOUNTER — Encounter: Payer: Self-pay | Admitting: *Deleted

## 2011-02-24 ENCOUNTER — Ambulatory Visit (INDEPENDENT_AMBULATORY_CARE_PROVIDER_SITE_OTHER)
Admission: RE | Admit: 2011-02-24 | Discharge: 2011-02-24 | Disposition: A | Discharge status: Home / Self Care | Payer: Medicare Other | Source: Ambulatory Visit | Attending: Adult Health | Admitting: Adult Health

## 2011-02-24 VITALS — BP 110/58 | HR 69 | Temp 98.0°F | Ht 66.0 in | Wt 175.6 lb

## 2011-02-24 DIAGNOSIS — J209 Acute bronchitis, unspecified: Secondary | ICD-10-CM

## 2011-02-24 DIAGNOSIS — J8410 Pulmonary fibrosis, unspecified: Secondary | ICD-10-CM

## 2011-02-24 MED ORDER — PREDNISONE 10 MG PO TABS: ORAL_TABLET | ORAL | Status: AC

## 2011-02-24 MED ORDER — CEFDINIR 300 MG PO CAPS: 300.0000 mg | ORAL_CAPSULE | Freq: Two times a day (BID) | ORAL | Status: AC

## 2011-02-24 NOTE — Patient Instructions (Signed)
Omnicef 300mg  Twice daily  For 7 days  Mucinex DM Twice daily  As needed  Cough/congestion Fluids and rest.  Prednisone taper over next week.  Please contact office for sooner follow up if symptoms do not improve or worsen or seek emergency care  follow up Dr. Shelle Iron in 3 months and As needed

## 2011-02-24 NOTE — Progress Notes (Signed)
  Subjective:    Patient ID: Wesley Pierce, male    DOB: [birth year], 77 y.o.   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    02/24/11 Acute OV  Pt presents for an acute OV. Complains of increased SOB w/ exertion, wheezing, dry cough that occasionally produces clear mucus x4-5days. OTC not helping. Cough is getting worse. He has been doing okay until last week. Last seen in office >1 year by Dr. Shelle Iron.  Cough does aggravate him at night.    Review of Systems Constitutional:   No  weight loss, night sweats,  Fevers, chills, fatigue, or  lassitude.  HEENT:   No headaches,  Difficulty swallowing,  Tooth/dental problems, or  Sore throat,                No sneezing, itching, ear ache, nasal congestion, post nasal drip,   CV:  No chest pain,  Orthopnea, PND, swelling in lower extremities, anasarca, dizziness, palpitations, syncope.   GI  No heartburn, indigestion, abdominal pain, nausea, vomiting, diarrhea, change in bowel habits, loss of appetite, bloody stools.   Resp:   No coughing up of blood.  No change in color of mucus.  No wheezing.  No chest wall deformity  Skin: no rash or lesions.  GU: no dysuria, change in color of urine, no urgency or frequency.  No flank pain, no hematuria   MS:  No joint pain or swelling.  No decreased range of motion.  No back pain.  Psych:  No change in mood or affect. No depression or anxiety.  No memory loss.          Objective:   Physical Exam GEN: A/Ox3; pleasant , NAD,  elderly  HEENT:  [place]/AT,  EACs-clear, TMs-wnl, NOSE-clear, THROAT-clear, no lesions, no postnasal drip or exudate noted.   NECK:  Supple w/ fair ROM; no JVD; normal carotid impulses w/o bruits; no thyromegaly or nodules palpated; no lymphadenopathy.  RESP  Coarse BS w/ no wheezingno accessory muscle use, no dullness to percussion  CARD:  RRR, no m/r/g  , no  peripheral edema, pulses intact, no cyanosis or clubbing.  GI:   Soft & nt; nml bowel sounds; no organomegaly or masses detected.  Musco: Warm bil, no deformities or joint swelling noted.   Neuro: alert, no focal deficits noted.    Skin: Warm, no lesions or rashes         Assessment & Plan:

## 2011-02-24 NOTE — Assessment & Plan Note (Signed)
fdf

## 2011-02-28 DIAGNOSIS — J209 Acute bronchitis, unspecified: Secondary | ICD-10-CM | POA: Insufficient documentation

## 2011-02-28 NOTE — Assessment & Plan Note (Signed)
Acute Bronchitis -xray with no acute changes.   Plan:  Omnicef 300mg  Twice daily  For 7 days  Mucinex DM Twice daily  As needed  Cough/congestion Fluids and rest.  Prednisone taper over next week.  Please contact office for sooner follow up if symptoms do not improve or worsen or seek emergency care  follow up Dr. Shelle Iron in 3 months and As needed

## 2011-05-29 ENCOUNTER — Ambulatory Visit (INDEPENDENT_AMBULATORY_CARE_PROVIDER_SITE_OTHER): Payer: Medicare Other | Admitting: Pulmonary Disease

## 2011-05-29 ENCOUNTER — Encounter: Payer: Self-pay | Admitting: Pulmonary Disease

## 2011-05-29 ENCOUNTER — Ambulatory Visit: Payer: Self-pay | Admitting: Internal Medicine

## 2011-05-29 VITALS — BP 108/62 | HR 65 | Temp 97.6°F | Ht 66.0 in | Wt 177.8 lb

## 2011-05-29 DIAGNOSIS — J8410 Pulmonary fibrosis, unspecified: Secondary | ICD-10-CM

## 2011-05-29 DIAGNOSIS — J449 Chronic obstructive pulmonary disease, unspecified: Secondary | ICD-10-CM | POA: Insufficient documentation

## 2011-05-29 DIAGNOSIS — J849 Interstitial pulmonary disease, unspecified: Secondary | ICD-10-CM

## 2011-05-29 DIAGNOSIS — J84115 Respiratory bronchiolitis interstitial lung disease: Secondary | ICD-10-CM

## 2011-05-29 DIAGNOSIS — J8409 Other alveolar and parieto-alveolar conditions: Secondary | ICD-10-CM

## 2011-05-29 NOTE — Patient Instructions (Signed)
Will try arcapta one inhalation each a.m. For the next 3-4 weeks.  Please give me some feedback with your progress. Continue to be as active as possible Followup with me in one year if doing well, or sooner if having issues.

## 2011-05-29 NOTE — Assessment & Plan Note (Addendum)
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  Since being off prednisone, he has not had radiographic progression.  I doubt his current symptoms represent a recurrence of his inflammatory process, but will re-evaluate if he worsens.

## 2011-05-29 NOTE — Assessment & Plan Note (Signed)
The patient has mild airflow obstruction on spirometry today, and therefore will give him a trial of a long-acting bronchodilator.  Hopefully this will improve his dyspnea on exertion.  I have also encouraged him to work aggressively on weight loss and conditioning.

## 2011-05-29 NOTE — Progress Notes (Signed)
  Subjective:    Patient ID: Wesley Pierce, male    DOB: 10-15-1930, 77 y.o.   MRN: [id number]  HPI The patient comes in today for followup of his history of inflammatory lung disease of unknown origin that is prednisone responsive.  Overall, he is doing well, but he did have an episode of acute bronchitis in May of this year.  He responded well to a course of antibiotics and prednisone.  He comes in today where he is remaining very functional, but does feel like his breathing is not as good as a year ago.  He denies any significant congestion, cough, or significant mucus.  His chest x-ray in May was totally stable.   Review of Systems  Constitutional: Negative for fever and unexpected weight change.  HENT: Positive for ear pain, congestion, rhinorrhea, sneezing and postnasal drip. Negative for nosebleeds, sore throat, trouble swallowing, dental problem and sinus pressure.   Eyes: Positive for itching. Negative for redness.  Respiratory: Positive for cough, chest tightness and shortness of breath. Negative for wheezing.   Cardiovascular: Negative for palpitations and leg swelling.  Gastrointestinal: Negative for nausea and vomiting.  Genitourinary: Positive for dysuria.  Musculoskeletal: Negative for joint swelling.  Skin: Negative for rash.  Neurological: Negative for headaches.  Hematological: Does not bruise/bleed easily.  Psychiatric/Behavioral: Negative for dysphoric mood. The patient is not nervous/anxious.        Objective:   Physical Exam Well-developed male in no acute distress Nose without purulence or discharge noted Chest with good air flow, crackles noted in the right base that is chronic.  No wheezes or rhonchi Cardiac same with regular rate and rhythm Lower extremities with no significant edema, no cyanosis noted Alert and oriented, moves all 4 extremities.       Assessment & Plan:

## 2011-06-26 ENCOUNTER — Telehealth: Payer: Self-pay | Admitting: Pulmonary Disease

## 2011-06-26 MED ORDER — INDACATEROL MALEATE 75 MCG IN CAPS: 75.0000 ug | ORAL_CAPSULE | Freq: Once | RESPIRATORY_TRACT | Status: DC

## 2011-06-26 NOTE — Telephone Encounter (Signed)
I spoke with Wesley Pierce and he states he was calling to give Dr. Shelle Iron an update the arcapta. Wesley Pierce states he feels like it is helping him. He states he feels like he can take a deeper breathe and does not get as winded. Wesley Pierce states he can tell it has improved his breathing. Wesley Pierce states if Dr. Shelle Iron wants him to continue this medication then he will need an rx sent to [facility]. Please advise Dr. Shelle Iron. Thanks  Carver Fila, CMA

## 2011-06-26 NOTE — Telephone Encounter (Signed)
The drug rep has left me a lot of samples.  Have him come by to get some more, but also call in a script for his pharmacist to hold.  He can check and seen if this is more expensive than foradil bid, and let him make the choice.

## 2011-06-26 NOTE — Telephone Encounter (Signed)
I spoke with pt and is aware samples where left upfront for p/u. And I have called the pharmacy to make them aware to keep on hold until pt ready for p/u.

## 2011-08-14 ENCOUNTER — Telehealth: Payer: Self-pay | Admitting: Pulmonary Disease

## 2011-08-14 MED ORDER — INDACATEROL MALEATE 75 MCG IN CAPS: 75.0000 ug | ORAL_CAPSULE | Freq: Once | RESPIRATORY_TRACT | Status: DC

## 2011-08-14 NOTE — Telephone Encounter (Signed)
Spoke with pt and he states calling to let [facility] know that the arcapta seems to be helping his breathing a lot and states only has 3 doses left, is it okay to call in rx? Please advise, thanks!

## 2011-08-14 NOTE — Telephone Encounter (Signed)
Pt states he would like a 90 day supply called into [facility] on [address]. i advise pt rx will be sent into pharmacy adn nothing further was needed

## 2011-08-14 NOTE — Telephone Encounter (Signed)
Yes.  Either monthly or 90 day with refills.

## 2011-08-14 NOTE — Telephone Encounter (Signed)
ATC pt na. Unable to leave vm due to mailbox was full [facility]

## 2011-08-14 NOTE — Telephone Encounter (Signed)
See other note in string

## 2011-08-17 ENCOUNTER — Telehealth: Payer: Self-pay | Admitting: Pulmonary Disease

## 2011-08-17 NOTE — Telephone Encounter (Signed)
Called [facility] at [phone]. Member ID # is [id number]. Questions answered via phone call and the case has been sent for clinical review. We should have a response within 48 to 72 hours. Pt has been notified and aware we will leave a sample of Arcapta for him to pick up so he does not run out of medicine. Will forward msg to Aundra Millet so she can watch for the approval or denial.

## 2011-08-18 NOTE — Telephone Encounter (Signed)
I spoke with pt and is aware the arcapta was denied. The denial form states pt must try and fail foradil aerolizer and serevent diskus. Pt is aware KC is not in the office currently and was fine with a call back next week. Please advise Dr. Shelle Iron, thanks

## 2011-08-18 NOTE — Telephone Encounter (Signed)
Pt stated he was called & that his arcapta claim was denied.  Pt asked to be reached at [phone]  Antionette Fairy

## 2011-08-21 ENCOUNTER — Telehealth: Payer: Self-pay | Admitting: Pulmonary Disease

## 2011-08-21 MED ORDER — FORMOTEROL FUMARATE 12 MCG IN CAPS: ORAL_CAPSULE | RESPIRATORY_TRACT | Status: DC

## 2011-08-21 NOTE — Telephone Encounter (Signed)
Let him know we started with arcapta because it was convenient at once a day.  Would try foradil one inhalation am and pm. Let him know also a good medication, but is twice a day.

## 2011-08-21 NOTE — Telephone Encounter (Signed)
I spoke with pt and he states his foradil requires a prior aurthrization. I advised pt will call in the AM to do PA since they are closed. WC pt back and let him know if approved or not. Pt states if not then he is going to bring his formulary list in.

## 2011-08-21 NOTE — Telephone Encounter (Signed)
I spoke with pt and states he was fine with trying the foradil. Pt is aware of the instructions of foradil and rx has been sent

## 2011-08-22 NOTE — Telephone Encounter (Signed)
I called [facility] and have made them aware of the approval. Nothing further was needed

## 2011-08-22 NOTE — Telephone Encounter (Signed)
I called and initiated PA through [employer] and pt's Foradil was approved through 10/01/2012. I have attempted to call pt pharmacy to make aware but was closed. Will call pharmacy back. Pt is aware Foradil was approved

## 2011-09-04 ENCOUNTER — Telehealth: Payer: Self-pay | Admitting: Pulmonary Disease

## 2011-09-04 MED ORDER — AZITHROMYCIN 250 MG PO TABS
ORAL_TABLET | ORAL | Status: AC
Start: 2011-09-04 — End: 2011-09-09

## 2011-09-04 NOTE — Telephone Encounter (Signed)
z-pak ok OV if no better in 4 days

## 2011-09-04 NOTE — Telephone Encounter (Signed)
Spoke with pt. He is c/o prod cough with clear to light yellow sputum and "throat congestion" x 4 days. Had fever 2 days ago and also has noticed some increased SOB with exertion. Would like something called in. No appt available to offer. Please advise, thanks!

## 2011-09-04 NOTE — Telephone Encounter (Signed)
Encounter closed in error.

## 2011-09-04 NOTE — Telephone Encounter (Signed)
Addended by: Christen Butter on: 09/04/2011 10:54 AM   Modules accepted: Orders

## 2011-09-04 NOTE — Telephone Encounter (Signed)
Rx sent to pharm and spoke with pt and notified that this was done. He is aware to call for appt in 4 days if not improving.

## 2011-09-15 ENCOUNTER — Telehealth: Payer: Self-pay | Admitting: Pulmonary Disease

## 2011-09-15 MED ORDER — HYDROCODONE-HOMATROPINE 5-1.5 MG/5ML PO SYRP: 5.0000 mL | ORAL_SOLUTION | Freq: Four times a day (QID) | ORAL | Status: AC | PRN

## 2011-09-15 NOTE — Telephone Encounter (Signed)
Ok to call in hycodan 5 cc po q6h prn cough.  If not improving or getting worse, needs to see me next week.  6 ounces, no fills.

## 2011-09-15 NOTE — Telephone Encounter (Signed)
LMOM informing pt rx sent to pharmacy to help with the symptoms he called here about.  And instructed pt to schedule ov next week if symptoms do not improve or worsen.

## 2011-09-15 NOTE — Telephone Encounter (Signed)
Called and spoke with pt and he stated that he woke up at 3am and had tightness in his chest then started coughing.  Then all this started again this afternoon with the same symptoms.  Pt cough with clear sputum.  Pt is requesting KC recs for this.  Pt did state that he is leaving soon and ok to call back and leave message on machine for him.Marland Kitchen KC please advise. thanks

## 2011-12-20 ENCOUNTER — Other Ambulatory Visit: Payer: Self-pay | Admitting: Pulmonary Disease

## 2011-12-23 ENCOUNTER — Ambulatory Visit: Payer: Medicare Other

## 2011-12-23 ENCOUNTER — Ambulatory Visit (INDEPENDENT_AMBULATORY_CARE_PROVIDER_SITE_OTHER): Payer: Medicare Other | Admitting: Family Medicine

## 2011-12-23 VITALS — BP 146/85 | HR 71 | Temp 98.4°F | Resp 16 | Ht 66.5 in | Wt 178.6 lb

## 2011-12-23 DIAGNOSIS — S61419A Laceration without foreign body of unspecified hand, initial encounter: Secondary | ICD-10-CM

## 2011-12-23 DIAGNOSIS — S61409A Unspecified open wound of unspecified hand, initial encounter: Secondary | ICD-10-CM

## 2011-12-23 DIAGNOSIS — M25549 Pain in joints of unspecified hand: Secondary | ICD-10-CM

## 2011-12-23 NOTE — Progress Notes (Signed)
[facility]:  Office Visit  Chief Complaint:  Chief Complaint  Patient presents with  . Injury    cut left forefinger with piece of tin-tetanus is current per patient    HPI: Wesley Pierce is a 78 y.o. male who complains of  Left index finger laceration at 1 pm today while helping friend put up tin roof. Able to move finger, denies umbness, weakness or tingling. Cleaned with water and peroxide. Bleeding is less but continues, patient is on baby ASA  He claims to be UTD of tetanus.   Past Medical History  Diagnosis Date  . Osteoarthritis   . Hyperlipidemia   . GERD (gastroesophageal reflux disease)   . Restless legs syndrome (RLS)   . Interstitial lung disease   . Pleural thickening    Past Surgical History  Procedure Date  . Cataract extraction 03/2008  . Lung decortication 1962   History   Social History  . Marital Status: Single    Spouse Name: N/A    Number of Children: N/A  . Years of Education: N/A   Occupational History  . retired from [employer]    Social History Main Topics  . Smoking status: Never Smoker   . Smokeless tobacco: None  . Alcohol Use: None  . Drug Use: None  . Sexually Active: None   Other Topics Concern  . None   Social History Narrative  . None   Family History  Problem Relation Age of Onset  . Allergies Brother   . Heart disease Sister   . Stroke Sister   . Clotting disorder Sister    Allergies  Allergen Reactions  . Sulfonamide Derivatives     Pt unable to remember  . Tussionex Pennkinetic Er     Pt unable to remember  . Ciprofloxacin Rash   Prior to Admission medications   Medication Sig Start Date End Date Taking? Authorizing Provider  acetaminophen (TYLENOL) 325 MG tablet Take 650 mg by mouth as needed.     Yes Historical Provider, MD  aspirin 81 MG tablet Take 81 mg by mouth daily.     Yes Historical Provider, MD  docusate sodium (COLACE) 100 MG capsule Take 100 mg by mouth daily.    Yes Historical  Provider, MD  ergocalciferol (VITAMIN D2) 50000 UNITS capsule Take 50,000 Units by mouth once a week.     Yes Historical Provider, MD  fluticasone (FLONASE) 50 MCG/ACT nasal spray 2 sprays by Nasal route daily.     Yes Historical Provider, MD  FORADIL AEROLIZER 12 MCG capsule for inhaler use 1 inhalation every morning and every evening 12/20/11  Yes Barbaraann Share, MD  Indacaterol Maleate (ARCAPTA NEOHALER) 75 MCG CAPS Place 75 mcg into inhaler and inhale once. 1 puff once a day 08/14/11 08/13/12 Yes Barbaraann Share, MD  rOPINIRole (REQUIP) 1 MG tablet Take 1 mg by mouth at bedtime. Takes 1 1/2 tab QHS   Yes Historical Provider, MD     ROS: The patient denies fevers, chills, night sweats, unintentional weight loss, chest pain, palpitations, wheezing, dyspnea on exertion, nausea, vomiting, abdominal pain, dysuria, hematuria, melena, numbness, weakness, or tingling. + cut finger  All other systems have been reviewed and were otherwise negative with the exception of those mentioned in the HPI and as above.    PHYSICAL EXAM: Filed Vitals:   12/23/11 1417  BP: 146/85  Pulse: 71  Temp: 98.4 F (36.9 C)  Resp: 16   Filed Vitals:  12/23/11 1417  Height: 5' 6.5" (1.689 m)  Weight: 178 lb 9.6 oz (81.012 kg)   Body mass index is 28.39 kg/(m^2).  General: Alert, no acute distress HEENT:  Normocephalic, atraumatic, oropharynx patent.  Cardiovascular:  Regular rate and rhythm, no rubs murmurs or gallops.  No Carotid bruits, radial pulse intact. No pedal edema.  Respiratory: Clear to auscultation bilaterally.  No wheezes, rales, or rhonchi.  No cyanosis, no use of accessory musculature GI: No organomegaly, abdomen is soft and non-tender, positive bowel sounds.  No masses. Skin: No rashes. Neurologic: Facial musculature symmetric. Psychiatric: Patient is appropriate throughout our interaction. Lymphatic: No cervical lymphadenopathy Musculoskeletal: Gait intact. Left finger: + radial pulse,  index finger, no tendon exposure; + 55 strength at DIP, PIP, sensation intact. Neurovascualrly intact   LABS:    EKG/XRAY:   Primary read interpreted by Dr. Conley Rolls at [facility].   ASSESSMENT/PLAN: Encounter Diagnosis  Name Primary?  . Hand laceration Yes       Lynze Reddy PHUONG, DO 12/23/2011 3:05 PM

## 2011-12-23 NOTE — Patient Instructions (Signed)

## 2011-12-23 NOTE — Progress Notes (Signed)
Verbal consent obtained from the patient.  Local anesthesia with 3cc Lidocaine 2% without epinephrine.  Wound scrubbed with soap and water and rinsed.  Sterile drape. Wound closed with 6 5-0 Ethilon HM and SI sutures.  Wound cleansed and dressed.

## 2011-12-23 NOTE — Progress Notes (Signed)
[facility]:  Office Visit  Chief Complaint:  Chief Complaint  Patient presents with  . Injury    cut left forefinger with piece of tin-tetanus is current per patient    HPI: Wesley Pierce is a 78 y.o. male who complains of  Left index finger laceration at 1 pm today while helping friend put up tin roof. Able to move finger, denies umbness, weakness or tingling. Cleaned with water and peroxide. Bleeding is less but continues, patient is on baby ASA  He claims to be UTD of tetanus.   Past Medical History  Diagnosis Date  . Osteoarthritis   . Hyperlipidemia   . GERD (gastroesophageal reflux disease)   . Restless legs syndrome (RLS)   . Interstitial lung disease   . Pleural thickening    Past Surgical History  Procedure Date  . Cataract extraction 03/2008  . Lung decortication 1962   History   Social History  . Marital Status: Single    Spouse Name: N/A    Number of Children: N/A  . Years of Education: N/A   Occupational History  . retired from [employer]    Social History Main Topics  . Smoking status: Never Smoker   . Smokeless tobacco: None  . Alcohol Use: None  . Drug Use: None  . Sexually Active: None   Other Topics Concern  . None   Social History Narrative  . None   Family History  Problem Relation Age of Onset  . Allergies Brother   . Heart disease Sister   . Stroke Sister   . Clotting disorder Sister    Allergies  Allergen Reactions  . Sulfonamide Derivatives     Pt unable to remember  . Tussionex Pennkinetic Er     Pt unable to remember  . Ciprofloxacin Rash   Prior to Admission medications   Medication Sig Start Date End Date Taking? Authorizing Provider  acetaminophen (TYLENOL) 325 MG tablet Take 650 mg by mouth as needed.     Yes Historical Provider, MD  aspirin 81 MG tablet Take 81 mg by mouth daily.     Yes Historical Provider, MD  docusate sodium (COLACE) 100 MG capsule Take 100 mg by mouth daily.    Yes Historical  Provider, MD  ergocalciferol (VITAMIN D2) 50000 UNITS capsule Take 50,000 Units by mouth once a week.     Yes Historical Provider, MD  fluticasone (FLONASE) 50 MCG/ACT nasal spray 2 sprays by Nasal route daily.     Yes Historical Provider, MD  FORADIL AEROLIZER 12 MCG capsule for inhaler use 1 inhalation every morning and every evening 12/20/11  Yes Barbaraann Share, MD  Indacaterol Maleate (ARCAPTA NEOHALER) 75 MCG CAPS Place 75 mcg into inhaler and inhale once. 1 puff once a day 08/14/11 08/13/12 Yes Barbaraann Share, MD  rOPINIRole (REQUIP) 1 MG tablet Take 1 mg by mouth at bedtime. Takes 1 1/2 tab QHS   Yes Historical Provider, MD     ROS: The patient denies fevers, chills, night sweats, unintentional weight loss, chest pain, palpitations, wheezing, dyspnea on exertion, nausea, vomiting, abdominal pain, dysuria, hematuria, melena, numbness, weakness, or tingling. + cut finger  All other systems have been reviewed and were otherwise negative with the exception of those mentioned in the HPI and as above.    PHYSICAL EXAM: Filed Vitals:   12/23/11 1417  BP: 146/85  Pulse: 71  Temp: 98.4 F (36.9 C)  Resp: 16   Filed Vitals:  12/23/11 1417  Height: 5' 6.5" (1.689 m)  Weight: 178 lb 9.6 oz (81.012 kg)   Body mass index is 28.39 kg/(m^2).  General: Alert, no acute distress HEENT:  Normocephalic, atraumatic, oropharynx patent.  Cardiovascular:  Regular rate and rhythm, no rubs murmurs or gallops.  No Carotid bruits, radial pulse intact. No pedal edema.  Respiratory: Clear to auscultation bilaterally.  No wheezes, rales, or rhonchi.  No cyanosis, no use of accessory musculature GI: No organomegaly, abdomen is soft and non-tender, positive bowel sounds.  No masses. Skin: No rashes. Neurologic: Facial musculature symmetric. Psychiatric: Patient is appropriate throughout our interaction. Lymphatic: No cervical lymphadenopathy Musculoskeletal: Gait intact. Left finger: + radial pulse,  index finger, no tendon exposure; + 5/5 strength at DIP, PIP, sensation intact. Neurovascualrly intact   LABS:    EKG/XRAY:   Primary read interpreted by Dr. Conley Rolls at [facility]. No fractures, dislocation + minimal soft tissue injury/swelling   ASSESSMENT/PLAN: Encounter Diagnosis  Name Primary?  . Hand laceration Yes   No tendon injury, patient UTD of tetanus. Wound care f/u as directed.   [name], DO 12/23/2011 3:05 PM

## 2011-12-26 ENCOUNTER — Other Ambulatory Visit: Payer: Self-pay | Admitting: Pulmonary Disease

## 2011-12-26 MED ORDER — FORMOTEROL FUMARATE 12 MCG IN CAPS: 12.0000 ug | ORAL_CAPSULE | Freq: Two times a day (BID) | RESPIRATORY_TRACT | Status: DC

## 2011-12-27 ENCOUNTER — Telehealth: Payer: Self-pay | Admitting: Family Medicine

## 2011-12-27 NOTE — Telephone Encounter (Signed)
Spoke to patient about official xray report, need to get repeat xray to see if foreign is still present on f/u viist. He has no s/sx of infection.

## 2011-12-29 ENCOUNTER — Ambulatory Visit (INDEPENDENT_AMBULATORY_CARE_PROVIDER_SITE_OTHER): Payer: Medicare Other | Admitting: Emergency Medicine

## 2011-12-29 ENCOUNTER — Ambulatory Visit: Payer: Medicare Other

## 2011-12-29 VITALS — BP 117/74 | HR 54 | Temp 98.0°F | Resp 16 | Ht 65.25 in | Wt 175.2 lb

## 2011-12-29 DIAGNOSIS — S61209A Unspecified open wound of unspecified finger without damage to nail, initial encounter: Secondary | ICD-10-CM

## 2011-12-29 NOTE — Patient Instructions (Signed)
Continue with washing daily with soap and water.  Apply an antibiotic ointment daily.  If the swelling increases, or if you develop increasing pain or redness, or develop a fever above 101, please return promptly.

## 2011-12-29 NOTE — Progress Notes (Signed)
  Subjective:    Patient ID: Wesley Pierce, male    DOB: 01-14-1931, 78 y.o.   MRN: [id number]  HPI Patient presents for suture removal and wound re-evaluation after sustaining a wound to the left index finger 7 days ago.  Dr. Conley Rolls called him to advise a repeat xray of his finger because the overread revealed a tiny radiopacity that could be a FB.  He's feeling well.  A little bit of tenderness this morning has been the only discomfort he has experienced.  He also notes some mild swelling of the finger this morning.   Review of Systems As above.    Objective:   Physical Exam VS noted.  Awake and oriented x 3.   Wound is well healed.  Mild swelling.  Non-tender.  No erythema.  Sutures removed without incident.  [facility] reading (PRIMARY) by  Dr. Cleta Alberts. No FB seen.  Soft tissue swelling noted at the proximal phalanx.Tiny radiopacity seen on initial film not seen today..       Assessment & Plan:   1. Wound, open, finger  DG Finger Index Left   Local wound care.  Re-evaluate as needed.  Monitor for signs of infection.

## 2012-02-05 ENCOUNTER — Ambulatory Visit (INDEPENDENT_AMBULATORY_CARE_PROVIDER_SITE_OTHER): Payer: Medicare Other | Admitting: Surgery

## 2012-02-05 ENCOUNTER — Encounter (INDEPENDENT_AMBULATORY_CARE_PROVIDER_SITE_OTHER): Payer: Self-pay | Admitting: Surgery

## 2012-02-05 DIAGNOSIS — K649 Unspecified hemorrhoids: Secondary | ICD-10-CM | POA: Insufficient documentation

## 2012-02-05 MED ORDER — HYDROCORTISONE ACE-PRAMOXINE 1-1 % RE FOAM: 1.0000 | Freq: Two times a day (BID) | RECTAL | Status: DC

## 2012-02-05 NOTE — Progress Notes (Signed)
Patient ID: Wesley Pierce, male   DOB: [birth year], 78 y.o.   MRN: [id number]  Chief Complaint  Patient presents with  . Hemorrhoids    HPI Wesley Pierce is a 78 y.o. male.   HPIPatient sent at the request of Dr. Vivianne Pierce due to hemorrhoids. He has had hemorrhoids for 10 years. Prolapse after bowel movements and is able to reduce. He denies any significant bleeding. Denies significant pain, burning or itching. He states his stool soft and denies constipation. He does not strain.   Past Medical History  Diagnosis Date  . Osteoarthritis   . Hyperlipidemia   . GERD (gastroesophageal reflux disease)   . Restless legs syndrome (RLS)   . Interstitial lung disease   . Pleural thickening   . Hemorrhoids     Past Surgical History  Procedure Date  . Cataract extraction 03/2008  . Lung decortication 1962    Family History  Problem Relation Age of Onset  . Allergies Brother   . Heart disease Sister   . Stroke Sister   . Clotting disorder Sister     Social History History  Substance Use Topics  . Smoking status: Never Smoker   . Smokeless tobacco: Not on file  . Alcohol Use: No    Allergies  Allergen Reactions  . Hydrocod Polst-Cpm Polst Er     Pt unable to remember  . Sulfonamide Derivatives     Pt unable to remember  . Ciprofloxacin Rash    Current Outpatient Prescriptions  Medication Sig Dispense Refill  . acetaminophen (TYLENOL) 325 MG tablet Take 650 mg by mouth as needed.        Marland Kitchen aspirin 81 MG tablet Take 81 mg by mouth daily.        Marland Kitchen docusate sodium (COLACE) 100 MG capsule Take 100 mg by mouth daily.       . ergocalciferol (VITAMIN D2) 50000 UNITS capsule Take 50,000 Units by mouth once a week.        . fluticasone (FLONASE) 50 MCG/ACT nasal spray 2 sprays by Nasal route daily.        . formoterol (FORADIL AEROLIZER) 12 MCG capsule for inhaler Place 1 capsule (12 mcg total) into inhaler and inhale 2 (two) times daily.  60 capsule  5  . rOPINIRole  (REQUIP) 1 MG tablet Take 1 mg by mouth at bedtime. Takes 1 1/2 tab QHS      . hydrocortisone-pramoxine (PROCTOFOAM HC) rectal foam Place 1 applicator rectally 2 (two) times daily.  10 g  0    Review of Systems Review of Systems  Constitutional: Negative.   HENT: Negative.   Eyes: Negative.   Respiratory: Negative.   Cardiovascular: Negative.   Gastrointestinal: Negative.   Genitourinary: Negative.   Musculoskeletal: Negative.   Neurological: Negative.   Hematological: Negative.   Psychiatric/Behavioral: Negative.     Blood pressure 138/86, pulse 60, temperature 97.2 F (36.2 C), temperature source Temporal, resp. rate 18, height 5\' 6"  (1.676 m), weight 174 lb 9.6 oz (79.198 kg).  Physical Exam Physical Exam  Constitutional: He appears well-developed and well-nourished.  HENT:  Head: Normocephalic and atraumatic.  Eyes: EOM are normal. Pupils are equal, round, and reactive to light.  Neck: Normal range of motion. Neck supple.  Genitourinary:       Rectal examination performed. Tone normal. No significant external hemorrhoids. No anal fissure anoscopy performed. Large 3 column grade 3 internal hemorrhoids prostate mildly enlarged  Skin: Skin is warm and dry.  Psychiatric: He has a normal mood and affect. His behavior is normal. Thought content normal.    Data Reviewed   Assessment    Grade 3 internal hemorrhoids with history of prolapse and reduction    Plan    Discussed options in office today. These are too large to band. I do not think sclerotherapy would be effective either. I discussed medical management ProctoFoam and high fiber diet versus formal hemorrhoidectomy. The patient would like to try medical management first. He has been on diltiazem but this is not effective for hemorrhoid disease I told to stop. He is taken Preparation H in the past and had some itching and burning this was felt to be an allergy. He had no other problems from that medication. We will try  ProctoFoam for one month and I gave him information about hemorrhoid disease and dietary modification. Return in one month for recheck.       Wesley Pierce A. 02/05/2012, 11:24 AM

## 2012-02-05 NOTE — Patient Instructions (Signed)

## 2012-02-15 ENCOUNTER — Other Ambulatory Visit (INDEPENDENT_AMBULATORY_CARE_PROVIDER_SITE_OTHER): Payer: Self-pay

## 2012-02-15 MED ORDER — HYDROCORTISONE ACE-PRAMOXINE 1-1 % RE FOAM: 1.0000 | Freq: Two times a day (BID) | RECTAL | Status: AC

## 2012-03-08 ENCOUNTER — Ambulatory Visit (INDEPENDENT_AMBULATORY_CARE_PROVIDER_SITE_OTHER): Payer: Medicare Other | Admitting: Surgery

## 2012-03-08 ENCOUNTER — Encounter (INDEPENDENT_AMBULATORY_CARE_PROVIDER_SITE_OTHER): Payer: Self-pay | Admitting: Surgery

## 2012-03-08 VITALS — BP 127/78 | HR 62 | Temp 97.4°F | Ht 66.0 in | Wt 176.4 lb

## 2012-03-08 DIAGNOSIS — K649 Unspecified hemorrhoids: Secondary | ICD-10-CM

## 2012-03-08 NOTE — Progress Notes (Signed)
Subjective:     Patient ID: Wesley Pierce, male   DOB: 1931-01-02, 78 y.o.   MRN: [id number]  HPI  Pt returns.  Feels about the same.  Reduces hemorrhoids after BM.  Not much pain or heavy bleeding.    Review of Systems  Constitutional: Negative.   HENT: Negative.   Eyes: Negative.   Respiratory: Negative.   Cardiovascular: Negative.   Gastrointestinal: Negative.   Musculoskeletal: Negative.        Objective:   Physical Exam  Constitutional: He appears well-developed and well-nourished.  HENT:  Head: Normocephalic and atraumatic.  Eyes: EOM are normal. Pupils are equal, round, and reactive to light.  Neck: Normal range of motion. Neck supple.  Genitourinary:          Assessment:     Grade 3 3 column internal hemorrhoids.    Plan:     Discuss surgical management medical management the patient again. Pros and cons of each discussed. He would like to postpone surgical intervention for now will reconsider if  His symptoms worsen.

## 2012-03-08 NOTE — Patient Instructions (Signed)
Return if symptoms worsen

## 2012-04-03 ENCOUNTER — Other Ambulatory Visit: Payer: Self-pay | Admitting: Cardiovascular Disease

## 2012-04-19 ENCOUNTER — Encounter (INDEPENDENT_AMBULATORY_CARE_PROVIDER_SITE_OTHER): Payer: Self-pay

## 2012-05-01 ENCOUNTER — Encounter: Payer: Self-pay | Admitting: Pulmonary Disease

## 2012-05-01 ENCOUNTER — Ambulatory Visit (INDEPENDENT_AMBULATORY_CARE_PROVIDER_SITE_OTHER): Payer: Medicare Other | Admitting: Pulmonary Disease

## 2012-05-01 VITALS — BP 106/64 | HR 61 | Temp 98.0°F | Ht 66.0 in | Wt 174.6 lb

## 2012-05-01 DIAGNOSIS — J209 Acute bronchitis, unspecified: Secondary | ICD-10-CM

## 2012-05-01 DIAGNOSIS — J8409 Other alveolar and parieto-alveolar conditions: Secondary | ICD-10-CM

## 2012-05-01 DIAGNOSIS — J449 Chronic obstructive pulmonary disease, unspecified: Secondary | ICD-10-CM

## 2012-05-01 MED ORDER — CEFDINIR 300 MG PO CAPS: 600.0000 mg | ORAL_CAPSULE | Freq: Every day | ORAL | Status: AC

## 2012-05-01 NOTE — Patient Instructions (Addendum)
Take mucinex one in am and pm for next week or so.  Drink plenty of water. Will give you a prescription for omnicef 300mg   Take 2 each am for 5 days.  Do not fill unless you feel things are getting worse.  If doing well, followup with me in one year.

## 2012-05-01 NOTE — Assessment & Plan Note (Signed)
The patient has been stable on his Foradil, and feels that his breathing was doing well until his worsening symptoms the last 2 weeks.  He has adequate air movement on exam, and no bronchospasm currently.

## 2012-05-01 NOTE — Assessment & Plan Note (Signed)
The patient has increased cough over the last 2 weeks with scant mucous production.  It is unclear whether this is coming from postnasal drip, or whether he is developing an early bronchitis.  He has seen some improvement over the last few days.  I will give him a prescription for an antibiotic to hold, but he is to take this if his symptoms worsen.

## 2012-05-01 NOTE — Progress Notes (Signed)
  Subjective:    Patient ID: Wesley Pierce, male    DOB: 1931/05/31, 78 y.o.   MRN: [id number]  HPI The patient comes in today for his yearly followup.  He has known interstitial lung disease of unknown origin that was steroid responsive, and has not worsened since being off the medication.  He also has COPD, and is being treated with a LABA.  The patient overall feels that he has done very well since the last visit, with no significant worsening of his shortness of breath.  However, the last 2 weeks he has noticed increasing cough with a small quantity of discolored mucus, but is not having significant chest congestion or sinus symptoms.  He is unsure if this is collecting in his throat from postnasal drip, or whether it is actually coming from his chest.  He felt very poorly 2 days ago with increased shortness of breath, but now feels that he maybe returning to his baseline.  He is not having any fevers, chills, or sweats.   Review of Systems  Constitutional: Positive for fatigue. Negative for fever and unexpected weight change.  HENT: Negative for ear pain, nosebleeds, congestion, sore throat, rhinorrhea, sneezing, trouble swallowing, dental problem, postnasal drip and sinus pressure.   Eyes: Negative for redness and itching.  Respiratory: Positive for cough and shortness of breath. Negative for chest tightness and wheezing.   Cardiovascular: Negative for palpitations and leg swelling.  Gastrointestinal: Negative for nausea and vomiting.  Genitourinary: Positive for dysuria.  Musculoskeletal: Negative for joint swelling.  Skin: Negative for rash.  Neurological: Negative for headaches.  Hematological: Does not bruise/bleed easily.  Psychiatric/Behavioral: Negative for dysphoric mood. The patient is nervous/anxious.   All other systems reviewed and are negative.       Objective:   Physical Exam Well-developed male in no acute distress Nose without purulence or discharge noted Chest  with decreased breath sounds in the right base and a few crackles, otherwise clear.  No wheezing noted Cardiac exam with regular rate and rhythm Lower extremities without edema, no cyanosis Alert and oriented, moves all 4 extremities.       Assessment & Plan:

## 2012-05-16 ENCOUNTER — Telehealth (INDEPENDENT_AMBULATORY_CARE_PROVIDER_SITE_OTHER): Payer: Self-pay | Admitting: General Surgery

## 2012-05-16 ENCOUNTER — Other Ambulatory Visit (INDEPENDENT_AMBULATORY_CARE_PROVIDER_SITE_OTHER): Payer: Self-pay | Admitting: Surgery

## 2012-05-16 ENCOUNTER — Ambulatory Visit (INDEPENDENT_AMBULATORY_CARE_PROVIDER_SITE_OTHER): Payer: Medicare Other | Admitting: Surgery

## 2012-05-16 ENCOUNTER — Encounter (INDEPENDENT_AMBULATORY_CARE_PROVIDER_SITE_OTHER): Payer: Self-pay | Admitting: Surgery

## 2012-05-16 VITALS — BP 132/74 | HR 56 | Temp 97.2°F | Resp 16 | Ht 66.0 in | Wt 174.0 lb

## 2012-05-16 DIAGNOSIS — K649 Unspecified hemorrhoids: Secondary | ICD-10-CM

## 2012-05-16 NOTE — Telephone Encounter (Signed)
Message copied by Littie Deeds on Thu May 16, 2012  3:10 PM ------      Message from: Marnette Burgess      Created: Thu May 16, 2012  2:12 PM      Contact: [phone]       Pt called and stated that you were expecting his call, please call (I did page you but the call was not picked up)

## 2012-05-16 NOTE — Patient Instructions (Signed)
Hemorrhoidectomy Hemorrhoidectomy is surgery to remove hemorrhoids. Hemorrhoids are veins that have become swollen in the rectum. The rectum is the area from the bottom end of the intestines to the opening where bowel movements leave the body. Hemorrhoids can be uncomfortable. They can cause itching, bleeding and pain if a blood clot forms in them (thrombose). If hemorrhoids are small, surgery may not be needed. But if they cover a larger area, surgery is usually suggested.  LET YOUR CAREGIVER KNOW ABOUT:   Any allergies.   All medications you are taking, including:   Herbs, eyedrops, over-the-counter medications and creams.   Blood thinners (anticoagulants), aspirin or other drugs that could affect blood clotting.   Use of steroids (by mouth or as creams).   Previous problems with anesthetics, including local anesthetics.   Possibility of pregnancy, if this applies.   Any history of blood clots.   Any history of bleeding or other blood problems.   Previous surgery.   Smoking history.   Other health problems.  RISKS AND COMPLICATIONS All surgery carries some risk. However, hemorrhoid surgery usually goes smoothly. Possible complications could include:  Urinary retention.   Bleeding.   Infection.   A painful incision.   A reaction to the anesthesia (this is not common).  BEFORE THE PROCEDURE   Stop using aspirin and non-steroidal anti-inflammatory drugs (NSAIDs) for pain relief. This includes prescription drugs and over-the-counter drugs such as ibuprofen and naproxen. Also stop taking vitamin E. If possible, do this two weeks before your surgery.   If you take blood-thinners, ask your healthcare provider when you should stop taking them.   You will probably have blood and urine tests done several days before your surgery.   Do not eat or drink for about 8 hours before the surgery.   Arrive at least an hour before the surgery, or whenever your surgeon recommends. This  will give you time to check in and fill out any needed paperwork.   Hemorrhoidectomy is often an outpatient procedure. This means you will be able to go home the same day. Sometimes, though, people stay overnight in the hospital after the procedure. Ask your surgeon what to expect. Either way, make arrangements in advance for someone to drive you home.  PROCEDURE   The preparation:   You will change into a hospital gown.   You will be given an IV. A needle will be inserted in your arm. Medication can flow directly into your body through this needle.   You might be given an enema to clear your rectum.   Once in the operating room, you will probably lie on your side or be repositioned later to lying on your stomach.   You will be given anesthesia (medication) so you will not feel anything during the surgery. The surgery often is done with local anesthesia (the area near the hemorrhoids will be numb and you will be drowsy but awake). Sometimes, general anesthesia is used (you will be asleep during the procedure).   The procedure:   There are a few different procedures for hemorrhoids. Be sure to ask you surgeon about the procedure, the risks and benefits.   Be sure to ask about what you need to do to take care of the wound, if there is one.  AFTER THE PROCEDURE  You will stay in a recovery area until the anesthesia has worn off. Your blood pressure and pulse will be checked every so often.   You may feel a lot of  pain in the area of the rectum.   Take all pain medication prescribed by your surgeon. Ask before taking any over-the-counter pain medicines.   Sometimes sitting in a warm bath can help relieve your pain.   To make sure you have bowel movements without straining:   You will probably need to take stool softeners (usually a pill) for a few days.   You should drink 8 to 10 glasses of water each day.   Your activity will be restricted for awhile. Ask your caregiver for a list  of what you should and should not do while you recover.  Document Released: 07/16/2009 Document Revised: 09/07/2011 Document Reviewed: 07/16/2009 [facility] Patient Information 2012 [place], [place].    Hemorrhoidectomy Care After Hemorrhoidectomy is the removal of enlarged (dilated) veins around the rectum. Until the surgical areas are healed, control of pain and avoiding constipation are the greatest challenges for patients.  For as long as 24 hours after receiving an anesthetic (the medication that made you sleep), and while taking narcotic pain relievers, you may feel dizzy, weak and drowsy. For that reason, the following information applies to the first 24-hour period following surgery, and continues for as long as you are taking narcotic pain medications.  Do not drive a car, ride a bicycle, participate in activities in which you could be hurt. Do not take public transportation until you are off narcotic pain medications and until your caregiver says it is okay.   Do not drink alcohol, take tranquilizers, or medications not prescribed or allowed by your surgical caregiver.   Do not sign important papers or contracts for at least 24 hours or while taking narcotic medications.   Have a responsible person with you for 24 hours.  RISKS AND COMPLICATIONS Some problems that may occur following this procedure include:  Infection. A germ starts growing in the tissue surrounding the site operated on. This can usually be treated with antibiotics.   Damage to the rectal sphincter could occur. This is the muscle that opens in your anus to allow a bowel movement. This could cause incontinence. This is uncommon.   Bleeding following surgery can be a complication of almost any surgery. Your surgeon takes every precaution to keep this from happening.   Complications of anesthesia.  HOME CARE INSTRUCTIONS  Avoid straining when having bowel movements.   Avoid heavy lifting (more than 10 pounds (4.5  kilograms)).   Only take over-the-counter or prescription medicines for pain, discomfort, or fever as directed by your caregiver.   Take hot sitz baths for 20 to 30 minutes, 3 to 4 times per day.   To keep swelling down, apply an ice pack for twenty minutes three to four times per day between sitz baths. Use a towel between your skin and the ice pack. Do not do this if it causes too much discomfort.   Keep anal area clean and dry. Following a bowel movement, you can gently wash the area with tucks (available for purchase at a drugstore) or cotton swabs. Gently pat the area dry. Do not rub the area.   Eat a well balanced diet and drink 6 to 8 glasses of water every day to avoid constipation. A bulk laxative may be also be helpful.  SEEK MEDICAL CARE IF:   You have increasing pain or tenderness near or in the surgical site.   You are unable to eat or drink.   You develop nausea or vomiting.   You develop uncontrolled bleeding such as  soaking two to three pads in one hour.   You have constipation, not helped by changing your diet or increasing your fluid intake. Pain medications are a common cause of constipation.   You have pain and redness (inflammation) extending outside the area of your surgery.   You develop an unexplained oral temperature above 102 F (38.9 C), or any other signs of infection.   You have any other questions or concerns following surgery.  Document Released: 12/09/2003 Document Revised: 09/07/2011 Document Reviewed: 03/08/2009 [facility] Patient Information 2012 [place], [place].

## 2012-05-16 NOTE — Telephone Encounter (Signed)
Returned pt phone call and he explained to me that he would like to go ahead and proceed with the surgery that Dr. Luisa Hart talked with him about today.  Advised the pt to stop any aspirin or herbal supplements five days before the surgery date he gets.  Informed him that I will have Dr. Luisa Hart fill our the surgical orders and we will send them around to the surgery schedulers for them to call him in the next couple of days.  He was fine with this.

## 2012-05-16 NOTE — Progress Notes (Signed)
Patient returns for followup of his grade 3 hemorrhoid disease. He is no better or no worse.  Exam: Not repeated today  Impression: History of grade 3 internal hemorrhoids requiring manual prolapse reduction  Plan: I have discussed options with him today to include surgical and nonsurgical. I do not think any her injection will be helpful for the size disease. Hemorrhoidectomy is probably the best procedure. He will think about it and contact me if he wished to proceed. Risk of bleeding, infection, organ injury, incontinence, sensation change of anal canal, urinary retention, organ injury, and the need for further surgery discussed

## 2012-05-28 ENCOUNTER — Ambulatory Visit: Payer: Medicare Other | Admitting: Pulmonary Disease

## 2012-06-18 ENCOUNTER — Encounter (HOSPITAL_BASED_OUTPATIENT_CLINIC_OR_DEPARTMENT_OTHER): Payer: Self-pay | Admitting: *Deleted

## 2012-06-18 ENCOUNTER — Ambulatory Visit
Admission: RE | Admit: 2012-06-18 | Discharge: 2012-06-18 | Disposition: A | Discharge status: Home / Self Care | Payer: Medicare Other | Source: Ambulatory Visit | Attending: Surgery | Admitting: Surgery

## 2012-06-18 ENCOUNTER — Encounter (HOSPITAL_BASED_OUTPATIENT_CLINIC_OR_DEPARTMENT_OTHER)
Admission: RE | Admit: 2012-06-18 | Discharge: 2012-06-18 | Disposition: A | Discharge status: Home / Self Care | Payer: Medicare Other | Source: Ambulatory Visit | Attending: Surgery | Admitting: Surgery

## 2012-06-18 LAB — CBC WITH DIFFERENTIAL/PLATELET
Basophils Absolute: 0 10*3/uL (ref 0.0–0.1)
Basophils Relative: 0 % (ref 0–1)
Eosinophils Absolute: 0.2 10*3/uL (ref 0.0–0.7)
MCH: 29.8 pg (ref 26.0–34.0)
MCHC: 34.6 g/dL (ref 30.0–36.0)
Neutrophils Relative %: 65 % (ref 43–77)
Platelets: 200 10*3/uL (ref 150–400)
RBC: 4.76 MIL/uL (ref 4.22–5.81)
RDW: 13 % (ref 11.5–15.5)

## 2012-06-18 LAB — COMPREHENSIVE METABOLIC PANEL
ALT: 18 U/L (ref 0–53)
Albumin: 4.1 g/dL (ref 3.5–5.2)
Alkaline Phosphatase: 78 U/L (ref 39–117)
Potassium: 4.7 mEq/L (ref 3.5–5.1)
Sodium: 135 mEq/L (ref 135–145)
Total Protein: 6.7 g/dL (ref 6.0–8.3)

## 2012-06-18 NOTE — Progress Notes (Signed)
In for labs and cxr All preop teaching done with wife and pt-

## 2012-06-21 ENCOUNTER — Encounter (HOSPITAL_BASED_OUTPATIENT_CLINIC_OR_DEPARTMENT_OTHER): Payer: Self-pay | Admitting: Anesthesiology

## 2012-06-21 ENCOUNTER — Ambulatory Visit (HOSPITAL_BASED_OUTPATIENT_CLINIC_OR_DEPARTMENT_OTHER): Payer: Medicare Other | Admitting: Anesthesiology

## 2012-06-21 ENCOUNTER — Encounter (HOSPITAL_BASED_OUTPATIENT_CLINIC_OR_DEPARTMENT_OTHER)
Admission: RE | Disposition: A | Discharge status: Home / Self Care | Payer: Self-pay | Source: Ambulatory Visit | Attending: Surgery

## 2012-06-21 ENCOUNTER — Ambulatory Visit (HOSPITAL_BASED_OUTPATIENT_CLINIC_OR_DEPARTMENT_OTHER)
Admission: RE | Admit: 2012-06-21 | Discharge: 2012-06-21 | Disposition: A | Discharge status: Home / Self Care | Payer: Medicare Other | Source: Ambulatory Visit | Attending: Surgery | Admitting: Surgery

## 2012-06-21 DIAGNOSIS — K219 Gastro-esophageal reflux disease without esophagitis: Secondary | ICD-10-CM | POA: Insufficient documentation

## 2012-06-21 DIAGNOSIS — K649 Unspecified hemorrhoids: Secondary | ICD-10-CM

## 2012-06-21 DIAGNOSIS — J4489 Other specified chronic obstructive pulmonary disease: Secondary | ICD-10-CM | POA: Insufficient documentation

## 2012-06-21 DIAGNOSIS — K644 Residual hemorrhoidal skin tags: Secondary | ICD-10-CM | POA: Insufficient documentation

## 2012-06-21 DIAGNOSIS — K648 Other hemorrhoids: Secondary | ICD-10-CM | POA: Insufficient documentation

## 2012-06-21 DIAGNOSIS — Z01812 Encounter for preprocedural laboratory examination: Secondary | ICD-10-CM | POA: Insufficient documentation

## 2012-06-21 DIAGNOSIS — J449 Chronic obstructive pulmonary disease, unspecified: Secondary | ICD-10-CM | POA: Insufficient documentation

## 2012-06-21 HISTORY — PX: HEMORRHOID SURGERY: SHX153

## 2012-06-21 SURGERY — HEMORRHOIDECTOMY
Anesthesia: General | Site: Rectum | Wound class: Dirty or Infected

## 2012-06-21 MED ORDER — DEXAMETHASONE SODIUM PHOSPHATE 4 MG/ML IJ SOLN
INTRAMUSCULAR | Status: DC | PRN
  Administered 2012-06-21: 10 mg via INTRAVENOUS

## 2012-06-21 MED ORDER — FLEET ENEMA 7-19 GM/118ML RE ENEM: 1.0000 | ENEMA | Freq: Once | RECTAL | Status: DC

## 2012-06-21 MED ORDER — DEXTROSE 5 % IV SOLN
3.0000 g | INTRAVENOUS | Status: AC
  Administered 2012-06-21: 3 g via INTRAVENOUS

## 2012-06-21 MED ORDER — SODIUM CHLORIDE 0.9 % IJ SOLN
INTRAMUSCULAR | Status: DC | PRN
  Administered 2012-06-21: 20 mL

## 2012-06-21 MED ORDER — OXYCODONE-ACETAMINOPHEN 5-325 MG PO TABS: 1.0000 | ORAL_TABLET | ORAL | Status: DC | PRN

## 2012-06-21 MED ORDER — BUPIVACAINE LIPOSOME 1.3 % IJ SUSP
INTRAMUSCULAR | Status: DC | PRN
  Administered 2012-06-21: 20 mL

## 2012-06-21 MED ORDER — MEPERIDINE HCL 25 MG/ML IJ SOLN: 6.2500 mg | INTRAMUSCULAR | Status: DC | PRN

## 2012-06-21 MED ORDER — HYDROMORPHONE HCL PF 1 MG/ML IJ SOLN
0.2500 mg | INTRAMUSCULAR | Status: DC | PRN
  Administered 2012-06-21: 0.25 mg via INTRAVENOUS

## 2012-06-21 MED ORDER — PROPOFOL 10 MG/ML IV BOLUS
INTRAVENOUS | Status: DC | PRN
  Administered 2012-06-21: 200 mg via INTRAVENOUS

## 2012-06-21 MED ORDER — POLYETHYLENE GLYCOL 3350 17 GM/SCOOP PO POWD: 17.0000 g | Freq: Every day | ORAL | Status: DC

## 2012-06-21 MED ORDER — OXYCODONE HCL 5 MG PO TABS: 5.0000 mg | ORAL_TABLET | Freq: Once | ORAL | Status: DC | PRN

## 2012-06-21 MED ORDER — OXYCODONE HCL 5 MG/5ML PO SOLN: 5.0000 mg | Freq: Once | ORAL | Status: DC | PRN

## 2012-06-21 MED ORDER — LACTATED RINGERS IV SOLN
INTRAVENOUS | Status: DC
  Administered 2012-06-21 (×2): via INTRAVENOUS

## 2012-06-21 MED ORDER — ONDANSETRON HCL 4 MG/2ML IJ SOLN
INTRAMUSCULAR | Status: DC | PRN
  Administered 2012-06-21: 4 mg via INTRAVENOUS

## 2012-06-21 MED ORDER — FENTANYL CITRATE 0.05 MG/ML IJ SOLN
INTRAMUSCULAR | Status: DC | PRN
  Administered 2012-06-21: 50 ug via INTRAVENOUS
  Administered 2012-06-21: 25 ug via INTRAVENOUS
  Administered 2012-06-21: 50 ug via INTRAVENOUS

## 2012-06-21 MED ORDER — PROMETHAZINE HCL 25 MG/ML IJ SOLN: 6.2500 mg | INTRAMUSCULAR | Status: DC | PRN

## 2012-06-21 SURGICAL SUPPLY — 49 items
APL SKNCLS STERI-STRIP NONHPOA (GAUZE/BANDAGES/DRESSINGS) ×1
BENZOIN TINCTURE PRP APPL 2/3 (GAUZE/BANDAGES/DRESSINGS) ×2 IMPLANT
BLADE SURG 11 STRL SS (BLADE) IMPLANT
BLADE SURG 15 STRL LF DISP TIS (BLADE) ×1 IMPLANT
BLADE SURG 15 STRL SS (BLADE) ×2
BRIEF STRETCH FOR OB PAD LRG (UNDERPADS AND DIAPERS) ×1 IMPLANT
CANISTER SUCTION 1200CC (MISCELLANEOUS) ×2 IMPLANT
CLOTH BEACON ORANGE TIMEOUT ST (SAFETY) ×2 IMPLANT
COVER MAYO STAND STRL (DRAPES) IMPLANT
COVER TABLE BACK 60X90 (DRAPES) IMPLANT
DECANTER SPIKE VIAL GLASS SM (MISCELLANEOUS) IMPLANT
DRAPE PED LAPAROTOMY (DRAPES) IMPLANT
DRAPE UTILITY XL STRL (DRAPES) ×2 IMPLANT
DRSG PAD ABDOMINAL 8X10 ST (GAUZE/BANDAGES/DRESSINGS) ×2 IMPLANT
ELECT COATED BLADE 2.86 ST (ELECTRODE) ×2 IMPLANT
ELECT REM PT RETURN 9FT ADLT (ELECTROSURGICAL) ×2
ELECTRODE REM PT RTRN 9FT ADLT (ELECTROSURGICAL) ×1 IMPLANT
GAUZE SPONGE 4X4 12PLY STRL LF (GAUZE/BANDAGES/DRESSINGS) IMPLANT
GLOVE BIOGEL PI IND STRL 8 (GLOVE) ×1 IMPLANT
GLOVE BIOGEL PI INDICATOR 8 (GLOVE) ×1
GLOVE ECLIPSE 8.0 STRL XLNG CF (GLOVE) ×2 IMPLANT
GLOVE SKINSENSE NS SZ7.0 (GLOVE) ×1
GLOVE SKINSENSE STRL SZ7.0 (GLOVE) IMPLANT
GOWN PREVENTION PLUS XLARGE (GOWN DISPOSABLE) ×3 IMPLANT
HEMOSTAT SURGICEL 2X14 (HEMOSTASIS) ×1 IMPLANT
NDL HYPO 25X1 1.5 SAFETY (NEEDLE) ×1 IMPLANT
NDL SAFETY ECLIPSE 18X1.5 (NEEDLE) IMPLANT
NEEDLE HYPO 18GX1.5 SHARP (NEEDLE) ×4
NEEDLE HYPO 25X1 1.5 SAFETY (NEEDLE) ×2 IMPLANT
PACK BASIN DAY SURGERY FS (CUSTOM PROCEDURE TRAY) ×2 IMPLANT
PACK LITHOTOMY IV (CUSTOM PROCEDURE TRAY) ×1 IMPLANT
PENCIL BUTTON HOLSTER BLD 10FT (ELECTRODE) ×2 IMPLANT
SHEARS HARMONIC 9CM CVD (BLADE) ×1 IMPLANT
SLEEVE SCD COMPRESS KNEE MED (MISCELLANEOUS) ×2 IMPLANT
SPONGE SURGIFOAM ABS GEL 100 (HEMOSTASIS) IMPLANT
SURGILUBE 2OZ TUBE FLIPTOP (MISCELLANEOUS) ×2 IMPLANT
SUT MON AB 3-0 SH 27 (SUTURE) ×6
SUT MON AB 3-0 SH27 (SUTURE) ×2 IMPLANT
SYR 20CC LL (SYRINGE) ×1 IMPLANT
SYR CONTROL 10ML LL (SYRINGE) ×2 IMPLANT
TAPE CLOTH 3X10 TAN LF (GAUZE/BANDAGES/DRESSINGS) ×2 IMPLANT
TAPE CLOTH SURG 6X10 WHT LF (GAUZE/BANDAGES/DRESSINGS) IMPLANT
TOWEL OR 17X24 6PK STRL BLUE (TOWEL DISPOSABLE) ×2 IMPLANT
TOWEL OR NON WOVEN STRL DISP B (DISPOSABLE) ×2 IMPLANT
TRAY DSU PREP LF (CUSTOM PROCEDURE TRAY) ×2 IMPLANT
TUBE CONNECTING 20X1/4 (TUBING) ×2 IMPLANT
UNDERPAD 30X30 INCONTINENT (UNDERPADS AND DIAPERS) ×2 IMPLANT
WATER STERILE IRR 1000ML POUR (IV SOLUTION) ×1 IMPLANT
YANKAUER SUCT BULB TIP NO VENT (SUCTIONS) ×2 IMPLANT

## 2012-06-21 NOTE — Anesthesia Procedure Notes (Signed)
Procedure Name: LMA Insertion Date/Time: 06/21/2012 2:37 PM Performed by: Zenia Resides D Pre-anesthesia Checklist: Patient identified, Emergency Drugs available, Suction available and Patient being monitored Patient Re-evaluated:Patient Re-evaluated prior to inductionOxygen Delivery Method: Circle System Utilized Preoxygenation: Pre-oxygenation with 100% oxygen Intubation Type: IV induction Ventilation: Mask ventilation without difficulty LMA: LMA inserted LMA Size: 5.0 Number of attempts: 1 Airway Equipment and Method: bite block Placement Confirmation: positive ETCO2 and breath sounds checked- equal and bilateral Tube secured with: Tape Dental Injury: Teeth and Oropharynx as per pre-operative assessment

## 2012-06-21 NOTE — Interval H&P Note (Signed)
History and Physical Interval Note:  06/21/2012 2:09 PM  Wesley Pierce  has presented today for surgery, with the diagnosis of hemorrhoids  The various methods of treatment have been discussed with the patient and family. After consideration of risks, benefits and other options for treatment, the patient has consented to  Procedure(s) (LRB) with comments: HEMORRHOIDECTOMY (N/A) as a surgical intervention .  The patient's history has been reviewed, patient examined, no change in status, stable for surgery.  I have reviewed the patient's chart and labs.  Questions were answered to the patient's satisfaction.     [name] A.

## 2012-06-21 NOTE — Transfer of Care (Signed)
Immediate Anesthesia Transfer of Care Note  Patient: Wesley Pierce  Procedure(s) Performed: Procedure(s) (LRB) with comments: HEMORRHOIDECTOMY (N/A)  Patient Location: PACU  Anesthesia Type: General  Level of Consciousness: awake  Airway & Oxygen Therapy: Patient Spontanous Breathing and Patient connected to face mask oxygen  Post-op Assessment: Report given to PACU RN and Post -op Vital signs reviewed and stable  Post vital signs: Reviewed and stable  Complications: No apparent anesthesia complications

## 2012-06-21 NOTE — H&P (Signed)
Wesley Pierce   02/05/2012 11:00 AM Office Visit  MRN: [id number]   Description: 78 year old male  Provider: Dortha Schwalbe., MD  Department: [facility]        Diagnoses     Hemorrhoids   - Primary    455      Reason for Visit     Hemorrhoids        Vitals - Last Recorded       BP Pulse Temp Resp Ht Wt    138/86 60 97.2 F (36.2 C) (Temporal) 18 5\' 6"  (1.676 m) 174 lb 9.6 oz (79.198 kg)         BMI              28.18 kg/m2                 Progress Notes     Wesley Pierce A., MD  02/05/2012 11:30 AM  Signed Patient ID: Wesley Pierce, male   DOB: [birth year], 78 y.o.   MRN: [id number]    Chief Complaint   Patient presents with   .  Hemorrhoids      HPI [name] is a 78 y.o. male.   HPIPatient sent at the request of Dr. Vivianne Master due to hemorrhoids. He has had hemorrhoids for 10 years. Prolapse after bowel movements and is able to reduce. He denies any significant bleeding. Denies significant pain, burning or itching. He states his stool soft and denies constipation. He does not strain.      Past Medical History   Diagnosis  Date   .  Osteoarthritis     .  Hyperlipidemia     .  GERD (gastroesophageal reflux disease)     .  Restless legs syndrome (RLS)     .  Interstitial lung disease     .  Pleural thickening     .  Hemorrhoids         Past Surgical History   Procedure  Date   .  Cataract extraction  03/2008   .  Lung decortication  1962       Family History   Problem  Relation  Age of Onset   .  Allergies  Brother     .  Heart disease  Sister     .  Stroke  Sister     .  Clotting disorder  Sister        Social History History   Substance Use Topics   .  Smoking status:  Never Smoker    .  Smokeless tobacco:  Not on file   .  Alcohol Use:  No       Allergies   Allergen  Reactions   .  Hydrocod Polst-Cpm Polst Er         Pt unable to remember   .  Sulfonamide Derivatives         Pt unable to remember   .   Ciprofloxacin  Rash       Current Outpatient Prescriptions   Medication  Sig  Dispense  Refill   .  acetaminophen (TYLENOL) 325 MG tablet  Take 650 mg by mouth as needed.           Marland Kitchen  aspirin 81 MG tablet  Take 81 mg by mouth daily.           Marland Kitchen  docusate sodium (COLACE) 100 MG capsule  Take 100 mg by mouth daily.          Marland Kitchen  ergocalciferol (VITAMIN D2) 50000 UNITS capsule  Take 50,000 Units by mouth once a week.           .  fluticasone (FLONASE) 50 MCG/ACT nasal spray  2 sprays by Nasal route daily.           .  formoterol (FORADIL AEROLIZER) 12 MCG capsule for inhaler  Place 1 capsule (12 mcg total) into inhaler and inhale 2 (two) times daily.   60 capsule   5   .  rOPINIRole (REQUIP) 1 MG tablet  Take 1 mg by mouth at bedtime. Takes 1 1/2 tab QHS         .  hydrocortisone-pramoxine (PROCTOFOAM HC) rectal foam  Place 1 applicator rectally 2 (two) times daily.   10 g   0      Review of Systems Review of Systems  Constitutional: Negative.   HENT: Negative.   Eyes: Negative.   Respiratory: Negative.   Cardiovascular: Negative.   Gastrointestinal: Negative.   Genitourinary: Negative.   Musculoskeletal: Negative.   Neurological: Negative.   Hematological: Negative.   Psychiatric/Behavioral: Negative.     Blood pressure 138/86, pulse 60, temperature 97.2 F (36.2 C), temperature source Temporal, resp. rate 18, height 5\' 6"  (1.676 m), weight 174 lb 9.6 oz (79.198 kg).   Physical Exam Physical Exam  Constitutional: He appears well-developed and well-nourished.  HENT:   Head: Normocephalic and atraumatic.  Eyes: EOM are normal. Pupils are equal, round, and reactive to light.  Neck: Normal range of motion. Neck supple.  Genitourinary:       Rectal examination performed. Tone normal. No significant external hemorrhoids. No anal fissure anoscopy performed. Large 3 column grade 3 internal hemorrhoids prostate mildly enlarged  Skin: Skin is warm and dry.  Psychiatric: He has a normal  mood and affect. His behavior is normal. Thought content normal.    Data Reviewed     Assessment Grade 3 internal hemorrhoids with history of prolapse and reduction   Plan   Discussed options in office today. These are too large to band. I do not think sclerotherapy would be effective either.Recommend hemorrhoidectomy after failure of medical management.Alternatives discussed include continue medical management,  Banding,  Injection or excision.  He wishes to proceed with excision.  Risk of bleeding,  Infection,  Stenosis,  Urinary retension,  Organ injury,  Cardiovascular issue,  Pulmonary issues death.  He agrees to hemorrhoidectomy      Kathlynn Swofford A. 06/21/2012

## 2012-06-21 NOTE — Anesthesia Preprocedure Evaluation (Signed)
Anesthesia Evaluation  Patient identified by MRN, date of birth, ID band Patient awake    Reviewed: Allergy & Precautions, H&P , NPO status , Patient's Chart, lab work & pertinent test results  Airway Mallampati: II  Neck ROM: Full  Mouth opening: Limited Mouth Opening  Dental  (+) Teeth Intact   Pulmonary pneumonia -, COPD Hx of Lung disease, uses inhalers qd breath sounds clear to auscultation        Cardiovascular negative cardio ROS  Rhythm:Regular     Neuro/Psych negative neurological ROS     GI/Hepatic Neg liver ROS, GERD-  ,  Endo/Other  negative endocrine ROS  Renal/GU negative Renal ROS     Musculoskeletal   Abdominal (+) + obese,   Peds  Hematology negative hematology ROS (+)   Anesthesia Other Findings   Reproductive/Obstetrics                           Anesthesia Physical Anesthesia Plan  ASA: II  Anesthesia Plan: General   Post-op Pain Management:    Induction: Intravenous  Airway Management Planned: LMA  Additional Equipment:   Intra-op Plan:   Post-operative Plan: Extubation in OR  Informed Consent: I have reviewed the patients History and Physical, chart, labs and discussed the procedure including the risks, benefits and alternatives for the proposed anesthesia with the patient or authorized representative who has indicated his/her understanding and acceptance.   Dental advisory given  Plan Discussed with: CRNA and Surgeon  Anesthesia Plan Comments:         Anesthesia Quick Evaluation

## 2012-06-21 NOTE — Anesthesia Postprocedure Evaluation (Signed)
  Anesthesia Post-op Note  Patient: Wesley Pierce  Procedure(s) Performed: Procedure(s) (LRB) with comments: HEMORRHOIDECTOMY (N/A)  Patient Location: PACU  Anesthesia Type: General  Level of Consciousness: awake  Airway and Oxygen Therapy: Patient Spontanous Breathing  Post-op Pain: mild  Post-op Assessment: Post-op Vital signs reviewed  Post-op Vital Signs: stable  Complications: No apparent anesthesia complications

## 2012-06-21 NOTE — Op Note (Signed)
Preoperative diagnosis: 3 column internal/external hemorrhoids  Postoperative diagnosis: Grade 3         3 column internal/external hemorrhoids with prolapse  Procedure: Internal/external hemorrhoidectomy 3 columns  Surgeon: Harriette Bouillon M.D.  Anesthesia: LMA with Exparel 20 cc/  20 cc saline  Specimens: Hemorrhoid tissue  EBL: Less than 50 cc  Drains: None  IV fluids: 800 cc crystalloid  Indications for procedure: The patient presents with grade 3 prolapse hemorrhoids. He has been managed for a number of months medically. His symptoms continued to escalate with bleeding, itching and difficulty with hygiene. These are too large to band or inject. I discussed continued medical management versus formal hemorrhoidectomy. He is not a candidate for PPH do to large external component. After discussion he was to proceed with surgery. Risk of bleeding, infection, anal stenosis, incontinence, recurrence, and the need for other procedures discussed. He agreed to proceed.  Description of procedure: The patient was met in the holding area and questions are answered. He is taken back to the operating room and placed upon the operating room table where LMA anesthesia was initiated. Timeout was done. He was then placed lithotomy and appropriately padded. Anal canal was then prepped and draped in sterile fashion and timeout was done. Digital examination was done which revealed normal time. He had large 3 column prolapse grade 3 hemorrhoids noted. Endoscope was placed. The left lateral column was largest was excised using the harmonic scalpel. 3-0 Monocryl was used to close the mucosa leaving the bottom open for drainage. The right posterior and right anterior columns were addressed in a similar fashion. After removal there is enough room for 2 fingers to traverse his anal canal. Hemostasis was excellent. Exparel used as perianal block. Gelfoam packing was used. All final counts sponge, needle and this was  found to be correct. The patient was taken lithotomy extubated taken recovery in satisfactory condition.

## 2012-06-24 ENCOUNTER — Telehealth (INDEPENDENT_AMBULATORY_CARE_PROVIDER_SITE_OTHER): Payer: Self-pay | Admitting: General Surgery

## 2012-06-24 MED ORDER — LIDOCAINE HCL 2 % EX GEL: CUTANEOUS | Status: DC

## 2012-06-24 NOTE — Telephone Encounter (Signed)
Per Dr Derrell Lolling okay to call in lidocaine 2% jelly #30 ml. RX called to [facility]. Patient aware.

## 2012-06-24 NOTE — Telephone Encounter (Signed)
Patient is status post hemorrhoidectomy by Dr Luisa Hart on Friday. He is having a lot of rectal pain. He is taking his pain medicine and doing warm tub soaks which does help. He read in his discharge summary about a rectal cream he could use to help with pain but he was not given an RX and he does not have one that was called to his pharmacy. He is asking if he can have something called in to [facility] on [address]. Dr Luisa Hart is off after call. Please advise.

## 2012-06-25 ENCOUNTER — Encounter (HOSPITAL_BASED_OUTPATIENT_CLINIC_OR_DEPARTMENT_OTHER): Payer: Self-pay | Admitting: Surgery

## 2012-06-26 ENCOUNTER — Telehealth (INDEPENDENT_AMBULATORY_CARE_PROVIDER_SITE_OTHER): Payer: Self-pay | Admitting: General Surgery

## 2012-06-26 NOTE — Telephone Encounter (Signed)
Pt called to report he is still having a lot of pain.  Reassured and empathsized with him.  Verified he is using the warm tub soaks, stool softener, Lidocaine cream, pain meds and Miralax.  Will call in Vicodin per protocol:  Hydrocodone 5/325 mg, # 30, 1-2 po Q4-6H prn pain, no refill.  Called in to [facility]:  [phone].

## 2012-07-22 ENCOUNTER — Encounter (INDEPENDENT_AMBULATORY_CARE_PROVIDER_SITE_OTHER): Payer: Self-pay | Admitting: Surgery

## 2012-07-22 ENCOUNTER — Ambulatory Visit (INDEPENDENT_AMBULATORY_CARE_PROVIDER_SITE_OTHER): Payer: Medicare Other | Admitting: Surgery

## 2012-07-22 VITALS — BP 122/72 | HR 52 | Temp 98.1°F | Resp 12 | Ht 66.0 in | Wt 172.0 lb

## 2012-07-22 DIAGNOSIS — Z9889 Other specified postprocedural states: Secondary | ICD-10-CM

## 2012-07-22 NOTE — Patient Instructions (Signed)
Continue present care.  Return 3 weeks.  Can use neosporin as lubricant.

## 2012-07-22 NOTE — Progress Notes (Signed)
Patient returns 3 and half weeks after hemorrhoidectomy. He's doing relatively well. Still has some swelling around the anal canal discomfort but he is able to move his lawn without difficulty.  Exam: Anal canal was some edema and open granulation tissue. No signs of infection.  Impression: Status post hemorrhoidectomy doing well   Plan: Return 3 weeks.

## 2012-08-12 ENCOUNTER — Encounter (INDEPENDENT_AMBULATORY_CARE_PROVIDER_SITE_OTHER): Payer: Self-pay | Admitting: Surgery

## 2012-08-12 ENCOUNTER — Ambulatory Visit (INDEPENDENT_AMBULATORY_CARE_PROVIDER_SITE_OTHER): Payer: Medicare Other | Admitting: Surgery

## 2012-08-12 VITALS — BP 130/78 | HR 72 | Temp 97.3°F | Resp 16 | Ht 66.0 in | Wt 173.2 lb

## 2012-08-12 DIAGNOSIS — K624 Stenosis of anus and rectum: Secondary | ICD-10-CM

## 2012-08-12 NOTE — Patient Instructions (Signed)
Will schedule for exam under anesthesia to dilate your anus.apply diltiazem for symptomatic relief.

## 2012-08-12 NOTE — Progress Notes (Signed)
Patient ID: Wesley Pierce, male   DOB: [birth year], 78 y.o.   MRN: [id number]  No chief complaint on file.   HPI Wesley Pierce is a 78 y.o. male.  Patient returns after hemorrhoidectomy 2 months ago. Complains of pencil caliber stool and discomfort with bowel movements. HPI  Past Medical History  Diagnosis Date  . Osteoarthritis   . Hyperlipidemia   . GERD (gastroesophageal reflux disease)   . Restless legs syndrome (RLS)   . Interstitial lung disease   . Pleural thickening   . Hemorrhoids     Past Surgical History  Procedure Date  . Cataract extraction 03/2008  . Lung decortication 1962  . Colonoscopy   . Hemorrhoid surgery 06/21/2012    Procedure: HEMORRHOIDECTOMY;  Surgeon: Clovis Pu. Rodolph Hagemann, MD;  Location: [facility];  Service: General;  Laterality: N/A;    Family History  Problem Relation Age of Onset  . Allergies Brother   . Heart disease Sister   . Stroke Sister   . Clotting disorder Sister     Social History History  Substance Use Topics  . Smoking status: Never Smoker   . Smokeless tobacco: Not on file  . Alcohol Use: No    Allergies  Allergen Reactions  . Advair Diskus (Fluticasone-Salmeterol)     anxiety  . Aloe Swelling  . Ambien (Zolpidem Tartrate) Other (See Comments)    Sleep walking  . Lyrica (Pregabalin) Other (See Comments)    Sleep walking   . Sulfonamide Derivatives     Pt unable to remember  . Ciprofloxacin Rash    Current Outpatient Prescriptions  Medication Sig Dispense Refill  . acetaminophen (TYLENOL) 325 MG tablet Take 650 mg by mouth as needed.        Marland Kitchen aspirin 81 MG tablet Take 81 mg by mouth daily.        . ergocalciferol (VITAMIN D2) 50000 UNITS capsule Take 50,000 Units by mouth once a week.        . fluticasone (FLONASE) 50 MCG/ACT nasal spray 2 sprays by Nasal route daily.        . formoterol (FORADIL AEROLIZER) 12 MCG capsule for inhaler Place 1 capsule (12 mcg total) into inhaler and inhale 2  (two) times daily.  60 capsule  5  . gabapentin (NEURONTIN) 300 MG capsule Take 300 mg by mouth daily.       Marland Kitchen oxyCODONE-acetaminophen (ROXICET) 5-325 MG per tablet Take 1 tablet by mouth every 4 (four) hours as needed for pain.  30 tablet  0  . polyethylene glycol powder (GLYCOLAX) powder Take 17 g by mouth daily.  255 g  0  . rOPINIRole (REQUIP) 1 MG tablet Take 1 mg by mouth at bedtime. Takes 1 1/2 tab QHS      . triamcinolone cream (KENALOG) 0.1 % Apply 1 application topically as directed.         Review of Systems Review of Systems  Constitutional: Negative.   HENT: Negative.   Respiratory: Negative.   Cardiovascular: Negative.   Gastrointestinal: Positive for rectal pain.    Blood pressure 130/78, pulse 72, temperature 97.3 F (36.3 C), resp. rate 16, height 5\' 6"  (1.676 m), weight 173 lb 3.2 oz (78.563 kg).  Physical Exam Physical Exam  Constitutional: He appears well-developed and well-nourished.  Neck: Normal range of motion. Neck supple.  Cardiovascular: Normal rate and regular rhythm.   Pulmonary/Chest: Effort normal and breath sounds normal.  Genitourinary:       anal  stenosis on rectal exam.  Can barely pass finger without significant resistance and patient discomfort.   Skin: Skin is warm and dry.      Assessment    Anal stenosis secondary to scaring after  hemorrhoidectomy    Plan    Discussed doing self dilations at home versus dilation operating room. Patient like to proceed the operating room for dilation since he did not feel like he can do it at home due to discomfort. Risk of bleeding, infection, incontinence, and the need for other operations discussed.. We will schedule for exam under anesthesia and dilation if necessary.The procedure has been discussed with the patient.  Alternative therapies have been discussed with the patient.  Operative risks include bleeding,  Infection,  Organ injury,  Nerve injury,  Blood vessel injury,  DVT,  Pulmonary embolism,   Death,  And possible reoperation.  Medical management risks include worsening of present situation.  The success of the procedure is 50 -90 % at treating patients symptoms.  The patient understands and agrees to proceed.       Jeanita Carneiro A. 08/12/2012, 11:05 AM

## 2012-08-16 ENCOUNTER — Encounter: Payer: Self-pay | Admitting: Pulmonary Disease

## 2012-08-16 ENCOUNTER — Ambulatory Visit (INDEPENDENT_AMBULATORY_CARE_PROVIDER_SITE_OTHER): Payer: Medicare Other | Admitting: Pulmonary Disease

## 2012-08-16 VITALS — BP 120/70 | HR 60 | Temp 98.1°F | Ht 66.0 in | Wt 176.4 lb

## 2012-08-16 DIAGNOSIS — J019 Acute sinusitis, unspecified: Secondary | ICD-10-CM

## 2012-08-16 MED ORDER — HYDROCODONE-HOMATROPINE 5-1.5 MG/5ML PO SYRP: 5.0000 mL | ORAL_SOLUTION | Freq: Four times a day (QID) | ORAL | Status: DC | PRN

## 2012-08-16 MED ORDER — CEFDINIR 300 MG PO CAPS: 300.0000 mg | ORAL_CAPSULE | Freq: Two times a day (BID) | ORAL | Status: DC

## 2012-08-16 NOTE — Assessment & Plan Note (Signed)
The patient is having increasing cough with mildly discolored mucus, as well as significant sinus symptoms.  He also has an abnormal nasal examination today.  I suspect this represents an acute sinusitis, and feel he will need treatment with an antibiotic.  I have also reviewed good nasal hygiene with him as well.

## 2012-08-16 NOTE — Progress Notes (Signed)
  Subjective:    Patient ID: Wesley Pierce, male    DOB: 09/15/1931, 78 y.o.   MRN: [id number]  HPI Patient comes in today for an acute sick visit.  He's had a one-week history of increasing nasal congestion and pressure, sore throat, and no significant cough with mildly discolored mucus.  The cough has been very significant, and has been bothering him at times.  He also has some general malaise.  He does not have a lot of chest congestion, and doesn't feel that he is bringing mucus up from his chest.   Review of Systems  Constitutional: Negative for fever and unexpected weight change.  HENT: Positive for congestion, sore throat and postnasal drip. Negative for ear pain, nosebleeds, rhinorrhea, sneezing, trouble swallowing, dental problem and sinus pressure.   Eyes: Negative for redness and itching.  Respiratory: Positive for cough and chest tightness. Negative for shortness of breath and wheezing.   Cardiovascular: Negative for palpitations and leg swelling.  Gastrointestinal: Negative for nausea and vomiting.  Genitourinary: Negative for dysuria.  Musculoskeletal: Negative for joint swelling.  Skin: Negative for rash.  Neurological: Negative for headaches.  Hematological: Does not bruise/bleed easily.  Psychiatric/Behavioral: Negative for dysphoric mood. The patient is not nervous/anxious.        Objective:   Physical Exam Well-developed male in no acute distress Nose with swollen and edematous mucosa, and a few purulent secretions Oropharynx clear, no exudates Neck without lymphadenopathy or thyromegaly Chest with a few rhonchi, and chronic crackles on the right which is his baseline. Cardiac exam with regular rate and rhythm Lower extremities without significant edema, no cyanosis Alert and oriented, moves all 4 extremities.       Assessment & Plan:

## 2012-08-16 NOTE — Patient Instructions (Addendum)
Will treat with omnicef 300mg   Take 2 each am for 7 days. hydromet cough syrup as directed to help with the cough if bothering you a lot.  Ok for you to take OTC cold preparations but no more than one week. Can use nasal saline spray to keep nose moist and clear as needed. Keep followup apptm with me, but call if you are not getting better.

## 2012-08-22 NOTE — Progress Notes (Signed)
Had a sinus cold last week-saw dr clance-antibiotics-much better

## 2012-08-27 ENCOUNTER — Encounter (HOSPITAL_BASED_OUTPATIENT_CLINIC_OR_DEPARTMENT_OTHER): Payer: Self-pay | Admitting: Certified Registered"

## 2012-08-27 ENCOUNTER — Ambulatory Visit (HOSPITAL_BASED_OUTPATIENT_CLINIC_OR_DEPARTMENT_OTHER): Payer: Medicare Other | Admitting: Certified Registered"

## 2012-08-27 ENCOUNTER — Encounter (HOSPITAL_BASED_OUTPATIENT_CLINIC_OR_DEPARTMENT_OTHER)
Admission: RE | Disposition: A | Discharge status: Home / Self Care | Payer: Self-pay | Source: Ambulatory Visit | Attending: Surgery

## 2012-08-27 ENCOUNTER — Encounter (HOSPITAL_BASED_OUTPATIENT_CLINIC_OR_DEPARTMENT_OTHER): Payer: Self-pay | Admitting: *Deleted

## 2012-08-27 ENCOUNTER — Ambulatory Visit (HOSPITAL_BASED_OUTPATIENT_CLINIC_OR_DEPARTMENT_OTHER)
Admission: RE | Admit: 2012-08-27 | Discharge: 2012-08-27 | Disposition: A | Discharge status: Home / Self Care | Payer: Medicare Other | Source: Ambulatory Visit | Attending: Surgery | Admitting: Surgery

## 2012-08-27 DIAGNOSIS — Z823 Family history of stroke: Secondary | ICD-10-CM | POA: Insufficient documentation

## 2012-08-27 DIAGNOSIS — J209 Acute bronchitis, unspecified: Secondary | ICD-10-CM

## 2012-08-27 DIAGNOSIS — E785 Hyperlipidemia, unspecified: Secondary | ICD-10-CM | POA: Insufficient documentation

## 2012-08-27 DIAGNOSIS — J019 Acute sinusitis, unspecified: Secondary | ICD-10-CM

## 2012-08-27 DIAGNOSIS — G2581 Restless legs syndrome: Secondary | ICD-10-CM | POA: Insufficient documentation

## 2012-08-27 DIAGNOSIS — Z8249 Family history of ischemic heart disease and other diseases of the circulatory system: Secondary | ICD-10-CM | POA: Insufficient documentation

## 2012-08-27 DIAGNOSIS — G4700 Insomnia, unspecified: Secondary | ICD-10-CM

## 2012-08-27 DIAGNOSIS — K219 Gastro-esophageal reflux disease without esophagitis: Secondary | ICD-10-CM | POA: Insufficient documentation

## 2012-08-27 DIAGNOSIS — Z9849 Cataract extraction status, unspecified eye: Secondary | ICD-10-CM | POA: Insufficient documentation

## 2012-08-27 DIAGNOSIS — J449 Chronic obstructive pulmonary disease, unspecified: Secondary | ICD-10-CM

## 2012-08-27 DIAGNOSIS — M1990 Unspecified osteoarthritis, unspecified site: Secondary | ICD-10-CM | POA: Insufficient documentation

## 2012-08-27 DIAGNOSIS — K624 Stenosis of anus and rectum: Secondary | ICD-10-CM | POA: Insufficient documentation

## 2012-08-27 DIAGNOSIS — K649 Unspecified hemorrhoids: Secondary | ICD-10-CM

## 2012-08-27 DIAGNOSIS — J8409 Other alveolar and parieto-alveolar conditions: Secondary | ICD-10-CM

## 2012-08-27 HISTORY — PX: EXAMINATION UNDER ANESTHESIA: SHX1540

## 2012-08-27 LAB — POCT HEMOGLOBIN-HEMACUE: Hemoglobin: 13.9 g/dL (ref 13.0–17.0)

## 2012-08-27 SURGERY — EXAM UNDER ANESTHESIA
Anesthesia: General | Site: Anus | Wound class: Clean Contaminated

## 2012-08-27 MED ORDER — BUPIVACAINE LIPOSOME 1.3 % IJ SUSP
INTRAMUSCULAR | Status: DC | PRN
  Administered 2012-08-27: 20 mL

## 2012-08-27 MED ORDER — OXYCODONE-ACETAMINOPHEN 5-325 MG PO TABS: 2.0000 | ORAL_TABLET | ORAL | Status: DC | PRN

## 2012-08-27 MED ORDER — FENTANYL CITRATE 0.05 MG/ML IJ SOLN
INTRAMUSCULAR | Status: DC | PRN
  Administered 2012-08-27: 100 ug via INTRAVENOUS

## 2012-08-27 MED ORDER — CHLORHEXIDINE GLUCONATE 4 % EX LIQD: 1.0000 "application " | Freq: Once | CUTANEOUS | Status: DC

## 2012-08-27 MED ORDER — LACTATED RINGERS IV SOLN
INTRAVENOUS | Status: DC
  Administered 2012-08-27: 09:00:00 via INTRAVENOUS

## 2012-08-27 MED ORDER — ONDANSETRON HCL 4 MG/2ML IJ SOLN
INTRAMUSCULAR | Status: DC | PRN
  Administered 2012-08-27: 4 mg via INTRAVENOUS

## 2012-08-27 MED ORDER — DEXTROSE 5 % IV SOLN
3.0000 g | INTRAVENOUS | Status: AC
  Administered 2012-08-27: 2 g via INTRAVENOUS

## 2012-08-27 MED ORDER — DEXAMETHASONE SODIUM PHOSPHATE 4 MG/ML IJ SOLN
INTRAMUSCULAR | Status: DC | PRN
  Administered 2012-08-27: 8 mg via INTRAVENOUS

## 2012-08-27 MED ORDER — LIDOCAINE HCL (CARDIAC) 20 MG/ML IV SOLN
INTRAVENOUS | Status: DC | PRN
  Administered 2012-08-27: 20 mg via INTRAVENOUS

## 2012-08-27 MED ORDER — ATROPINE SULFATE 0.4 MG/ML IJ SOLN
INTRAMUSCULAR | Status: DC | PRN
  Administered 2012-08-27 (×2): .1 mg via INTRAVENOUS

## 2012-08-27 MED ORDER — PROPOFOL 10 MG/ML IV BOLUS
INTRAVENOUS | Status: DC | PRN
Start: 2012-08-27 — End: 2012-08-27
  Administered 2012-08-27: 120 mg via INTRAVENOUS

## 2012-08-27 SURGICAL SUPPLY — 39 items
BLADE SURG 15 STRL LF DISP TIS (BLADE) ×1 IMPLANT
BLADE SURG 15 STRL SS (BLADE) ×2
BRIEF STRETCH FOR OB PAD LRG (UNDERPADS AND DIAPERS) ×1 IMPLANT
CANISTER SUCTION 1200CC (MISCELLANEOUS) ×2 IMPLANT
CLOTH BEACON ORANGE TIMEOUT ST (SAFETY) ×2 IMPLANT
COVER MAYO STAND STRL (DRAPES) ×1 IMPLANT
DECANTER SPIKE VIAL GLASS SM (MISCELLANEOUS) ×2 IMPLANT
DRAPE UTILITY XL STRL (DRAPES) ×1 IMPLANT
DRSG PAD ABDOMINAL 8X10 ST (GAUZE/BANDAGES/DRESSINGS) ×2 IMPLANT
ELECT REM PT RETURN 9FT ADLT (ELECTROSURGICAL)
ELECTRODE REM PT RTRN 9FT ADLT (ELECTROSURGICAL) IMPLANT
GAUZE SPONGE 4X4 12PLY STRL LF (GAUZE/BANDAGES/DRESSINGS) ×2 IMPLANT
GLOVE BIO SURGEON STRL SZ7.5 (GLOVE) ×1 IMPLANT
GLOVE BIOGEL PI IND STRL 8 (GLOVE) ×1 IMPLANT
GLOVE BIOGEL PI INDICATOR 8 (GLOVE) ×2
GLOVE ECLIPSE 8.0 STRL XLNG CF (GLOVE) ×2 IMPLANT
GOWN PREVENTION PLUS XLARGE (GOWN DISPOSABLE) ×2 IMPLANT
GOWN STRL REIN 2XL LVL4 (GOWN DISPOSABLE) ×1 IMPLANT
HEMOSTAT SURGICEL 2X14 (HEMOSTASIS) ×2 IMPLANT
NDL HYPO 25X1 1.5 SAFETY (NEEDLE) ×1 IMPLANT
NDL SAFETY ECLIPSE 18X1.5 (NEEDLE) IMPLANT
NEEDLE HYPO 18GX1.5 SHARP (NEEDLE) ×2
NEEDLE HYPO 25X1 1.5 SAFETY (NEEDLE) ×2 IMPLANT
PACK BASIN DAY SURGERY FS (CUSTOM PROCEDURE TRAY) ×2 IMPLANT
PACK LITHOTOMY IV (CUSTOM PROCEDURE TRAY) ×2 IMPLANT
PENCIL BUTTON HOLSTER BLD 10FT (ELECTRODE) IMPLANT
SHEET MEDIUM DRAPE 40X70 STRL (DRAPES) IMPLANT
SPONGE SURGIFOAM ABS GEL 12-7 (HEMOSTASIS) ×2 IMPLANT
SURGILUBE 2OZ TUBE FLIPTOP (MISCELLANEOUS) ×2 IMPLANT
SUT CHROMIC 3 0 SH 27 (SUTURE) IMPLANT
SYR CONTROL 10ML LL (SYRINGE) ×2 IMPLANT
TOWEL OR 17X24 6PK STRL BLUE (TOWEL DISPOSABLE) ×4 IMPLANT
TOWEL OR NON WOVEN STRL DISP B (DISPOSABLE) ×2 IMPLANT
TRAY DSU PREP LF (CUSTOM PROCEDURE TRAY) ×2 IMPLANT
TRAY PROCTOSCOPIC FIBER OPTIC (SET/KITS/TRAYS/PACK) IMPLANT
TUBE CONNECTING 20X1/4 (TUBING) ×2 IMPLANT
UNDERPAD 30X30 INCONTINENT (UNDERPADS AND DIAPERS) ×2 IMPLANT
WATER STERILE IRR 1000ML POUR (IV SOLUTION) IMPLANT
YANKAUER SUCT BULB TIP NO VENT (SUCTIONS) ×2 IMPLANT

## 2012-08-27 NOTE — Anesthesia Preprocedure Evaluation (Signed)
Anesthesia Evaluation  Patient identified by MRN, date of birth, ID band Patient awake    Reviewed: Allergy & Precautions, H&P , NPO status , Patient's Chart, lab work & pertinent test results  Airway Mallampati: II TM Distance: >3 FB Neck ROM: Full    Dental No notable dental hx. (+) Teeth Intact and Dental Advisory Given   Pulmonary neg pulmonary ROS, neg pneumonia -, neg COPD breath sounds clear to auscultation  Pulmonary exam normal       Cardiovascular negative cardio ROS  Rhythm:Regular Rate:Normal     Neuro/Psych negative neurological ROS  negative psych ROS   GI/Hepatic Neg liver ROS, GERD-  Medicated and Controlled,  Endo/Other  negative endocrine ROS  Renal/GU negative Renal ROS  negative genitourinary   Musculoskeletal   Abdominal   Peds  Hematology negative hematology ROS (+)   Anesthesia Other Findings   Reproductive/Obstetrics negative OB ROS                           Anesthesia Physical Anesthesia Plan  ASA: II  Anesthesia Plan: General   Post-op Pain Management:    Induction: Intravenous  Airway Management Planned: LMA  Additional Equipment:   Intra-op Plan:   Post-operative Plan: Extubation in OR  Informed Consent: I have reviewed the patients History and Physical, chart, labs and discussed the procedure including the risks, benefits and alternatives for the proposed anesthesia with the patient or authorized representative who has indicated his/her understanding and acceptance.   Dental advisory given  Plan Discussed with: CRNA  Anesthesia Plan Comments:         Anesthesia Quick Evaluation

## 2012-08-27 NOTE — Transfer of Care (Signed)
Immediate Anesthesia Transfer of Care Note  Patient: Wesley Pierce  Procedure(s) Performed: Procedure(s) (LRB) with comments: EXAM UNDER ANESTHESIA (N/A)  Patient Location: PACU  Anesthesia Type:General  Level of Consciousness: sedated and unresponsive  Airway & Oxygen Therapy: Patient Spontanous Breathing and Patient connected to face mask oxygen  Post-op Assessment: Report given to PACU RN and Post -op Vital signs reviewed and stable  Post vital signs: Reviewed and stable  Complications: No apparent anesthesia complications

## 2012-08-27 NOTE — Op Note (Signed)
Preoperative diagnosis: Anal stenosis  Postoperative diagnosis: Same  Procedure: Exam under anesthesia with anal dilation  Surgeon: Harriette Bouillon M.D.  Anesthesia: LMA with EXPAREL   EBL: Minimal  Specimen: None  Indications for procedure: The patient is a 78 year old male who underwent 3 column hemorrhoidectomy 2 months ago for large prolapse grade 3 to grade 4 internal hemorrhoids. He developed anal stenosis and did not tolerate dilation the office. He presents for exam under anesthesia and anal dilation. Risks include bleeding, infection, incontinence, recurrence of condition require further therapy.  Description of procedure: Patient had pulmonary and questions answered. Patient taken to operating room and placed supine. LMA anesthesia was initiated the patient was placed in lithotomy. Anal canal was prepped and draped in a sterile fashion. Timeout was done. He received 2 g of Ancef.Exparel was used for perianal block. I was able to advance 1 fingerbreadth of some scar tissue then advanced 2 fingers to light dilated to 2 fingers. Anoscope was placed and this was narrowed but the endoscope the easily. There is some oozing from where the anal skin tore and the oozing was controlled with cautery and Gelfoam and Surgicel. There is ample room for 2 finger slight ooze anal canal. Pressure was held for 15 minutes. Hemostasis achieved. All counts correct. Dry pack placed with mesh panties. The patient was taken out of lithotomy and taken to recovery in satisfactory condition.

## 2012-08-27 NOTE — Interval H&P Note (Signed)
History and Physical Interval Note:  08/27/2012 8:17 AM  Wesley Pierce  has presented today for surgery, with the diagnosis of anal stenosis  The various methods of treatment have been discussed with the patient and family. After consideration of risks, benefits and other options for treatment, the patient has consented to  Procedure(s) (LRB) with comments: EXAM UNDER ANESTHESIA (N/A) as a surgical intervention .  The patient's history has been reviewed, patient examined, no change in status, stable for surgery.  I have reviewed the patient's chart and labs.  Questions were answered to the patient's satisfaction.     [name] A.

## 2012-08-27 NOTE — H&P (View-Only) (Signed)
Patient ID: Wesley Pierce, male   DOB: 10/05/1930, 78 y.o.   MRN: [id number]  No chief complaint on file.   HPI Wesley Pierce is a 78 y.o. male.  Patient returns after hemorrhoidectomy 2 months ago. Complains of pencil caliber stool and discomfort with bowel movements. HPI  Past Medical History  Diagnosis Date  . Osteoarthritis   . Hyperlipidemia   . GERD (gastroesophageal reflux disease)   . Restless legs syndrome (RLS)   . Interstitial lung disease   . Pleural thickening   . Hemorrhoids     Past Surgical History  Procedure Date  . Cataract extraction 03/2008  . Lung decortication 1962  . Colonoscopy   . Hemorrhoid surgery 06/21/2012    Procedure: HEMORRHOIDECTOMY;  Surgeon: Malyiah Fellows A. Latif Nazareno, MD;  Location: [facility];  Service: General;  Laterality: N/A;    Family History  Problem Relation Age of Onset  . Allergies Brother   . Heart disease Sister   . Stroke Sister   . Clotting disorder Sister     Social History History  Substance Use Topics  . Smoking status: Never Smoker   . Smokeless tobacco: Not on file  . Alcohol Use: No    Allergies  Allergen Reactions  . Advair Diskus (Fluticasone-Salmeterol)     anxiety  . Aloe Swelling  . Ambien (Zolpidem Tartrate) Other (See Comments)    Sleep walking  . Lyrica (Pregabalin) Other (See Comments)    Sleep walking   . Sulfonamide Derivatives     Pt unable to remember  . Ciprofloxacin Rash    Current Outpatient Prescriptions  Medication Sig Dispense Refill  . acetaminophen (TYLENOL) 325 MG tablet Take 650 mg by mouth as needed.        . aspirin 81 MG tablet Take 81 mg by mouth daily.        . ergocalciferol (VITAMIN D2) 50000 UNITS capsule Take 50,000 Units by mouth once a week.        . fluticasone (FLONASE) 50 MCG/ACT nasal spray 2 sprays by Nasal route daily.        . formoterol (FORADIL AEROLIZER) 12 MCG capsule for inhaler Place 1 capsule (12 mcg total) into inhaler and inhale 2  (two) times daily.  60 capsule  5  . gabapentin (NEURONTIN) 300 MG capsule Take 300 mg by mouth daily.       . oxyCODONE-acetaminophen (ROXICET) 5-325 MG per tablet Take 1 tablet by mouth every 4 (four) hours as needed for pain.  30 tablet  0  . polyethylene glycol powder (GLYCOLAX) powder Take 17 g by mouth daily.  255 g  0  . rOPINIRole (REQUIP) 1 MG tablet Take 1 mg by mouth at bedtime. Takes 1 1/2 tab QHS      . triamcinolone cream (KENALOG) 0.1 % Apply 1 application topically as directed.         Review of Systems Review of Systems  Constitutional: Negative.   HENT: Negative.   Respiratory: Negative.   Cardiovascular: Negative.   Gastrointestinal: Positive for rectal pain.    Blood pressure 130/78, pulse 72, temperature 97.3 F (36.3 C), resp. rate 16, height 5' 6" (1.676 m), weight 173 lb 3.2 oz (78.563 kg).  Physical Exam Physical Exam  Constitutional: He appears well-developed and well-nourished.  Neck: Normal range of motion. Neck supple.  Cardiovascular: Normal rate and regular rhythm.   Pulmonary/Chest: Effort normal and breath sounds normal.  Genitourinary:       anal   stenosis on rectal exam.  Can barely pass finger without significant resistance and patient discomfort.   Skin: Skin is warm and dry.      Assessment    Anal stenosis secondary to scaring after  hemorrhoidectomy    Plan    Discussed doing self dilations at home versus dilation operating room. Patient like to proceed the operating room for dilation since he did not feel like he can do it at home due to discomfort. Risk of bleeding, infection, incontinence, and the need for other operations discussed.. We will schedule for exam under anesthesia and dilation if necessary.The procedure has been discussed with the patient.  Alternative therapies have been discussed with the patient.  Operative risks include bleeding,  Infection,  Organ injury,  Nerve injury,  Blood vessel injury,  DVT,  Pulmonary embolism,   Death,  And possible reoperation.  Medical management risks include worsening of present situation.  The success of the procedure is 50 -90 % at treating patients symptoms.  The patient understands and agrees to proceed.       Ticara Waner A. 08/12/2012, 11:05 AM    

## 2012-08-27 NOTE — Anesthesia Procedure Notes (Signed)
Procedure Name: LMA Insertion Date/Time: 08/27/2012 9:01 AM Performed by: Verlan Friends Pre-anesthesia Checklist: Patient identified, Emergency Drugs available, Suction available, Patient being monitored and Timeout performed Patient Re-evaluated:Patient Re-evaluated prior to inductionOxygen Delivery Method: Circle System Utilized Preoxygenation: Pre-oxygenation with 100% oxygen Intubation Type: IV induction Ventilation: Mask ventilation without difficulty LMA: LMA with gastric port inserted LMA Size: 4.0 Number of attempts: 1 Placement Confirmation: positive ETCO2 Tube secured with: Tape Dental Injury: Teeth and Oropharynx as per pre-operative assessment

## 2012-08-27 NOTE — Anesthesia Postprocedure Evaluation (Signed)
  Anesthesia Post-op Note  Patient: Wesley Pierce  Procedure(s) Performed: Procedure(s) (LRB) with comments: EXAM UNDER ANESTHESIA (N/A)  Patient Location: PACU  Anesthesia Type:General  Level of Consciousness: awake, alert  and oriented  Airway and Oxygen Therapy: Patient Spontanous Breathing  Post-op Pain: none  Post-op Assessment: Post-op Vital signs reviewed, Patient's Cardiovascular Status Stable, Respiratory Function Stable, Patent Airway and No signs of Nausea or vomiting  Post-op Vital Signs: Reviewed and stable  Complications: No apparent anesthesia complications

## 2012-09-02 ENCOUNTER — Encounter (HOSPITAL_BASED_OUTPATIENT_CLINIC_OR_DEPARTMENT_OTHER): Payer: Self-pay | Admitting: Surgery

## 2012-09-02 ENCOUNTER — Telehealth (INDEPENDENT_AMBULATORY_CARE_PROVIDER_SITE_OTHER): Payer: Self-pay

## 2012-09-02 NOTE — Telephone Encounter (Signed)
Patient called in requesting refill oxycodone 5-325. Pt had EUA/ anal stenosis 11/26. [place] off after call. Patient aware this will get addressed with urge doctor.

## 2012-09-03 ENCOUNTER — Telehealth (INDEPENDENT_AMBULATORY_CARE_PROVIDER_SITE_OTHER): Payer: Self-pay

## 2012-09-03 ENCOUNTER — Other Ambulatory Visit (INDEPENDENT_AMBULATORY_CARE_PROVIDER_SITE_OTHER): Payer: Self-pay | Admitting: Surgery

## 2012-09-03 MED ORDER — OXYCODONE-ACETAMINOPHEN 5-325 MG PO TABS: 2.0000 | ORAL_TABLET | ORAL | Status: DC | PRN

## 2012-09-03 NOTE — Telephone Encounter (Signed)
Called patient to let him know prescription is ready for pick up.

## 2012-09-16 ENCOUNTER — Ambulatory Visit (INDEPENDENT_AMBULATORY_CARE_PROVIDER_SITE_OTHER): Payer: Medicare Other | Admitting: Surgery

## 2012-09-16 ENCOUNTER — Encounter (INDEPENDENT_AMBULATORY_CARE_PROVIDER_SITE_OTHER): Payer: Self-pay | Admitting: Surgery

## 2012-09-16 VITALS — BP 128/80 | HR 60 | Temp 97.6°F | Resp 16 | Ht 66.0 in | Wt 173.0 lb

## 2012-09-16 DIAGNOSIS — Z9889 Other specified postprocedural states: Secondary | ICD-10-CM | POA: Insufficient documentation

## 2012-09-16 NOTE — Patient Instructions (Signed)
Return 3 weeks/

## 2012-09-16 NOTE — Progress Notes (Signed)
Patient returns after anal dilation secondary to stenosis from previous hemorrhoidectomy. He is moving his bowels but does have some small amount of leakage.  Exam: Anal canal healing well. Widely patent.  Impression: Anal stenosis status post dilation  Plan: Return in 3 weeks. We'll begin monthly dilation to prevent recurrence. Questions were answered today. No changing care otherwise.

## 2012-09-23 ENCOUNTER — Other Ambulatory Visit (INDEPENDENT_AMBULATORY_CARE_PROVIDER_SITE_OTHER): Payer: Self-pay

## 2012-09-23 DIAGNOSIS — Z8719 Personal history of other diseases of the digestive system: Secondary | ICD-10-CM

## 2012-09-23 MED ORDER — DILTIAZEM GEL 2 %: 1.0000 "application " | Freq: Two times a day (BID) | CUTANEOUS | Status: DC | PRN

## 2012-10-07 ENCOUNTER — Encounter (INDEPENDENT_AMBULATORY_CARE_PROVIDER_SITE_OTHER): Payer: Self-pay | Admitting: Surgery

## 2012-10-07 ENCOUNTER — Ambulatory Visit (INDEPENDENT_AMBULATORY_CARE_PROVIDER_SITE_OTHER): Payer: Medicare Other | Admitting: Surgery

## 2012-10-07 VITALS — BP 126/76 | HR 68 | Resp 16 | Ht 66.0 in | Wt 176.4 lb

## 2012-10-07 DIAGNOSIS — Z9889 Other specified postprocedural states: Secondary | ICD-10-CM

## 2012-10-07 NOTE — Patient Instructions (Signed)
Return 1 month

## 2012-10-07 NOTE — Progress Notes (Signed)
Patient returns after anal dilation secondary to stenosis from previous hemorrhoidectomy. He is moving his bowels leakage is better.  Exam: Anal canal healing well. Widely patent. Can place index finger through canal with minimal resistance  Impression: Anal stenosis status post dilation  Plan: Return in 4 weeks. Continue  monthly dilation to prevent recurrence. Questions were answered today. No changing care otherwise.

## 2012-10-24 ENCOUNTER — Telehealth: Payer: Self-pay | Admitting: Pulmonary Disease

## 2012-10-24 NOTE — Telephone Encounter (Signed)
I spoke with pt and he stated he is wanting to go back on the arcapta. He knows last year the insurance denied it but would like to re submit it again this year. He feels like it helps more than the foradil does. Pt aware KC is out of the office this afternoon. Please advise KC thanks

## 2012-10-25 ENCOUNTER — Telehealth: Payer: Self-pay | Admitting: Pulmonary Disease

## 2012-10-25 MED ORDER — INDACATEROL MALEATE 75 MCG IN CAPS: 1.0000 | ORAL_CAPSULE | Freq: Every day | RESPIRATORY_TRACT | Status: DC

## 2012-10-25 NOTE — Telephone Encounter (Signed)
Pt to stay on foradil .

## 2012-10-25 NOTE — Telephone Encounter (Signed)
Ok with me.  I think we also have a voucher in closet for a free one month. It may have a 09/2012 expiration date, but drug rep told me it will go thru for another year.

## 2012-10-25 NOTE — Telephone Encounter (Signed)
ATC pt, no answer. LMOMTCB 

## 2012-10-25 NOTE — Telephone Encounter (Signed)
KC, pt wanted aracapta and you said this was fine so we called in Med is too expensive for him and therefore wants to stay on foradil Should he just be on that alone? Or do we need to call in something else?  I am not going to give him samples of arcapta, no point if he cant pay for it  Please advise thanks!

## 2012-10-25 NOTE — Telephone Encounter (Signed)
Spoke with pt and notified of recs per [facility] He verbalized understanding and states nothing further needed Voucher mailed to him per his request and rx sent to pharm

## 2012-10-28 NOTE — Telephone Encounter (Signed)
Spoke with patient, he is aware to stay on the Foradil.  Verbalized understanding and nothing further needed.

## 2012-10-28 NOTE — Telephone Encounter (Signed)
Pt returned call.  Call him back @ [phone]. Wesley Pierce

## 2012-11-04 ENCOUNTER — Encounter (INDEPENDENT_AMBULATORY_CARE_PROVIDER_SITE_OTHER): Payer: Self-pay | Admitting: Surgery

## 2012-11-04 ENCOUNTER — Ambulatory Visit (INDEPENDENT_AMBULATORY_CARE_PROVIDER_SITE_OTHER): Payer: Medicare Other | Admitting: Surgery

## 2012-11-04 VITALS — BP 114/76 | HR 64 | Temp 98.0°F | Resp 14 | Ht 66.0 in | Wt 172.4 lb

## 2012-11-04 DIAGNOSIS — Z9889 Other specified postprocedural states: Secondary | ICD-10-CM

## 2012-11-04 NOTE — Progress Notes (Signed)
Patient returns after anal dilation secondary to stenosis from previous hemorrhoidectomy. He is moving his bowels leakage is better.  Exam: Anal canal healing well. Widely patent. Can place index finger through canal with minimal resistance  Impression: Anal stenosis status post dilation  Plan: Return in 4 weeks. Continue  monthly dilation to prevent recurrence. Questions were answered today. No changing care otherwise. 

## 2012-11-04 NOTE — Patient Instructions (Signed)
Return 1 month

## 2012-11-23 ENCOUNTER — Ambulatory Visit (INDEPENDENT_AMBULATORY_CARE_PROVIDER_SITE_OTHER): Payer: Medicare Other | Admitting: Emergency Medicine

## 2012-11-23 VITALS — BP 168/81 | HR 70 | Temp 97.9°F | Resp 16

## 2012-11-23 DIAGNOSIS — M25549 Pain in joints of unspecified hand: Secondary | ICD-10-CM

## 2012-11-23 DIAGNOSIS — S61219A Laceration without foreign body of unspecified finger without damage to nail, initial encounter: Secondary | ICD-10-CM

## 2012-11-23 DIAGNOSIS — S61209A Unspecified open wound of unspecified finger without damage to nail, initial encounter: Secondary | ICD-10-CM

## 2012-11-23 DIAGNOSIS — Z23 Encounter for immunization: Secondary | ICD-10-CM

## 2012-11-23 NOTE — Progress Notes (Signed)
[facility] [address] [place] [address] [id number]  Date:  11/23/2012   Name:  Wesley Pierce   DOB:  November 22, 1930   MRN:  [id number]  PCP:  Minda Meo, MD    Chief Complaint: Finger Injury   History of Present Illness:  Wesley Pierce is a 79 y.o. very pleasant male patient who presents with the following:  Injured a couple hours ago on a joiner.  He turned the machine off and hit the still spinning blade with his right index finger. Current, he believes, on tetanus.  Patient Active Problem List  Diagnosis  . HYPERLIPIDEMIA  . PERSISTENT DISORDER INITIATING/MAINTAINING SLEEP  . RESTLESS LEG SYNDROME  . UNSPEC ALVEOLAR&PARIETOALVEOLAR PNEUMONOPATHY  . GERD  . OSTEOARTHRITIS  . COPD (chronic obstructive pulmonary disease)  . Hemorrhoids  . Hemorrhoids  . Acute bronchitis  . Anal stenosis  . Acute sinusitis  . Post-operative state    Past Medical History  Diagnosis Date  . Osteoarthritis   . Hyperlipidemia   . GERD (gastroesophageal reflux disease)   . Restless legs syndrome (RLS)   . Interstitial lung disease   . Pleural thickening   . Hemorrhoids     Past Surgical History  Procedure Laterality Date  . Cataract extraction  03/2008  . Lung decortication  1962  . Colonoscopy    . Hemorrhoid surgery  06/21/2012    Procedure: HEMORRHOIDECTOMY;  Surgeon: Clovis Pu. Cornett, MD;  Location: [facility];  Service: General;  Laterality: N/A;  . Examination under anesthesia  08/27/2012    Procedure: EXAM UNDER ANESTHESIA;  Surgeon: Maisie Fus A. Cornett, MD;  Location: [facility];  Service: General;  Laterality: N/A;    History  Substance Use Topics  . Smoking status: Never Smoker   . Smokeless tobacco: Never Used  . Alcohol Use: No    Family History  Problem Relation Age of Onset  . Allergies Brother   . Heart disease Sister   . Stroke Sister   . Clotting disorder Sister     Allergies   Allergen Reactions  . Advair Diskus (Fluticasone-Salmeterol)     anxiety  . Aloe Swelling  . Ambien (Zolpidem Tartrate) Other (See Comments)    Sleep walking  . Lyrica (Pregabalin) Other (See Comments)    Sleep walking   . Sulfonamide Derivatives     Pt unable to remember  . Ciprofloxacin Rash    Medication list has been reviewed and updated.  Current Outpatient Prescriptions on File Prior to Visit  Medication Sig Dispense Refill  . aspirin 81 MG tablet Take 81 mg by mouth daily.        . fluticasone (FLONASE) 50 MCG/ACT nasal spray 2 sprays by Nasal route daily.        . formoterol (FORADIL AEROLIZER) 12 MCG capsule for inhaler Place 1 capsule (12 mcg total) into inhaler and inhale 2 (two) times daily.  60 capsule  5  . gabapentin (NEURONTIN) 300 MG capsule Take 300 mg by mouth daily.       Marland Kitchen rOPINIRole (REQUIP) 1 MG tablet Take 1 mg by mouth at bedtime. Takes 1 1/2 tab QHS      . acetaminophen (TYLENOL) 325 MG tablet Take 650 mg by mouth as needed.        . diltiazem 2 % GEL Apply 1 application topically 2 (two) times daily as needed.  1 g  0  . ergocalciferol (VITAMIN D2) 50000 UNITS  capsule Take 50,000 Units by mouth once a week.        Marland Kitchen guaiFENesin (MUCINEX) 600 MG 12 hr tablet Take 600 mg by mouth as needed.       . Indacaterol Maleate (ARCAPTA NEOHALER) 75 MCG CAPS Place 1 capsule into inhaler and inhale daily.  30 capsule  5  . lidocaine (LMX) 4 % cream Apply topically as needed.      . polyethylene glycol powder (GLYCOLAX) powder Take 17 g by mouth daily.  255 g  0  . triamcinolone cream (KENALOG) 0.1 % Apply 1 application topically as needed.        No current facility-administered medications on file prior to visit.    Review of Systems:  As per HPI, otherwise negative.    Physical Examination: Filed Vitals:   11/23/12 1610  BP: 168/81  Pulse: 70  Temp: 97.9 F (36.6 C)  Resp: 16   There were no vitals filed for this visit. There is no weight on file to  calculate BMI. Ideal Body Weight:     GEN: WDWN, NAD, Non-toxic, Alert & Oriented x 3 HEENT: Atraumatic, Normocephalic.  Ears and Nose: No external deformity. EXTR: No clubbing/cyanosis/edema NEURO: Normal gait.  PSYCH: Normally interactive. Conversant. Not depressed or anxious appearing.  Calm demeanor.  Right index finger:  Avulsion of tip of finger.  No bone exposed. No FB. NATI.  Assessment and Plan: Finger tip 'meat slicer injury". Daily dressing changes TDAP Follow up as needed  Wesley Dane, MD

## 2012-11-23 NOTE — Patient Instructions (Signed)

## 2012-12-03 ENCOUNTER — Encounter (INDEPENDENT_AMBULATORY_CARE_PROVIDER_SITE_OTHER): Payer: Self-pay | Admitting: Surgery

## 2012-12-03 ENCOUNTER — Ambulatory Visit (INDEPENDENT_AMBULATORY_CARE_PROVIDER_SITE_OTHER): Payer: Medicare Other | Admitting: Surgery

## 2012-12-03 VITALS — BP 128/76 | HR 72 | Temp 97.9°F | Resp 14 | Ht 66.0 in | Wt 172.6 lb

## 2012-12-03 DIAGNOSIS — Z9889 Other specified postprocedural states: Secondary | ICD-10-CM

## 2012-12-03 NOTE — Progress Notes (Signed)
Patient returns after anal dilation secondary to stenosis from previous hemorrhoidectomy. He is moving his bowels leakage is better.  Exam: Anal canal healing well. Widely patent. Can place index finger through canal with minimal resistance  Impression: Anal stenosis status post dilation  Plan: Return in 6 weeks. Continue  monthly dilation to prevent recurrence. Questions were answered today. No changing care otherwise.

## 2012-12-03 NOTE — Patient Instructions (Signed)
Return 6 weeks

## 2013-01-20 ENCOUNTER — Other Ambulatory Visit: Payer: Self-pay | Admitting: Pulmonary Disease

## 2013-02-03 ENCOUNTER — Encounter (INDEPENDENT_AMBULATORY_CARE_PROVIDER_SITE_OTHER): Payer: Self-pay | Admitting: Surgery

## 2013-02-03 ENCOUNTER — Ambulatory Visit (INDEPENDENT_AMBULATORY_CARE_PROVIDER_SITE_OTHER): Payer: Medicare Other | Admitting: Surgery

## 2013-02-03 VITALS — BP 160/88 | HR 66 | Temp 97.8°F | Ht 66.0 in | Wt 170.0 lb

## 2013-02-03 DIAGNOSIS — K624 Stenosis of anus and rectum: Secondary | ICD-10-CM

## 2013-02-03 NOTE — Progress Notes (Signed)
Subjective:     Patient ID: Wesley Pierce, male   DOB: 1931-04-29, 79 y.o.   MRN: [id number]  HPI Pt returns for anal stenosis.  Underwent EUA in 11/13.  Doing well.  Review of Systems  Constitutional: Negative.   Gastrointestinal: Negative for constipation and blood in stool.  Musculoskeletal: Positive for arthralgias.       Objective:   Physical Exam  Constitutional: He appears well-developed and well-nourished.  Pulmonary/Chest: Effort normal and breath sounds normal.  Genitourinary:          Assessment:     Anal stenosis resolved    Plan:     Return as needed.  High fiber diet.

## 2013-02-03 NOTE — Patient Instructions (Signed)
Return as needed

## 2013-02-04 ENCOUNTER — Encounter (INDEPENDENT_AMBULATORY_CARE_PROVIDER_SITE_OTHER): Payer: Medicare Other | Admitting: General Surgery

## 2013-05-01 ENCOUNTER — Ambulatory Visit: Payer: Medicare Other | Admitting: Pulmonary Disease

## 2013-05-02 ENCOUNTER — Ambulatory Visit: Payer: Self-pay | Admitting: Pulmonary Disease

## 2013-05-12 ENCOUNTER — Encounter: Payer: Self-pay | Admitting: Pulmonary Disease

## 2013-05-12 ENCOUNTER — Ambulatory Visit (INDEPENDENT_AMBULATORY_CARE_PROVIDER_SITE_OTHER): Payer: Medicare Other | Admitting: Pulmonary Disease

## 2013-05-12 VITALS — BP 140/76 | HR 52 | Temp 97.3°F | Ht 66.25 in | Wt 168.0 lb

## 2013-05-12 DIAGNOSIS — J449 Chronic obstructive pulmonary disease, unspecified: Secondary | ICD-10-CM

## 2013-05-12 DIAGNOSIS — G2581 Restless legs syndrome: Secondary | ICD-10-CM

## 2013-05-12 NOTE — Patient Instructions (Addendum)
Continue foradil for your breathing. Take requip after dinner rather than at bedtime. In the place of gabapentin, take horizant 600mg  one at 5pm each day.  Give me some feedback in a few weeks with how things are going. If doing well, followup with me in one year.

## 2013-05-12 NOTE — Progress Notes (Signed)
  Subjective:    Patient ID: Wesley Pierce, male    DOB: 08/03/1931, 79 y.o.   MRN: [id number]  HPI The patient comes in today for followup of his mild COPD.  He is doing well on Foradil, and feels that his exertional tolerance has been stable since last visit.  He denies any recent acute exacerbation.  He does complain today about his sleep, both initiating and maintaining.  He has a history of restless leg syndrome, and he feels this is a significant contributor to his symptoms.  His current medical regimen does not appear to be controlling his movements adequately.   Review of Systems  Constitutional: Negative for fever and unexpected weight change.  HENT: Negative for ear pain, nosebleeds, congestion, sore throat, rhinorrhea, sneezing, trouble swallowing, dental problem, postnasal drip and sinus pressure.   Eyes: Negative for redness and itching.  Respiratory: Negative for cough, chest tightness, shortness of breath and wheezing.   Cardiovascular: Negative for palpitations and leg swelling.  Gastrointestinal: Negative for nausea and vomiting.  Genitourinary: Negative for dysuria.  Musculoskeletal: Negative for joint swelling.  Skin: Negative for rash.  Neurological: Negative for headaches.  Hematological: Does not bruise/bleed easily.  Psychiatric/Behavioral: Positive for sleep disturbance ( difficulty getting to sleep//staying asleep x 1-2 years). Negative for dysphoric mood. The patient is not nervous/anxious.        Objective:   Physical Exam Well-developed male in no acute distress Nose without purulence or discharge noted Neck without lymphadenopathy or thyromegaly Chest with good air flow except in the right base, with associated crackles Heart exam with regular rate and rhythm Lower extremities with mild edema, no cyanosis Alert and oriented, moves all 4 extremities.       Assessment & Plan:

## 2013-05-13 NOTE — Assessment & Plan Note (Signed)
The patient is doing well from a pulmonary standpoint on his current inhaler regimen.  I have asked him to continue on this, and to try and get as much exercise as possible.

## 2013-05-13 NOTE — Assessment & Plan Note (Signed)
The patient is having persistent RLS symptoms despite being on gabapentin and Requip.  He is having symptoms at that time, but is taking his Requip at bedtime rather than after dinner.  I will have him move his dose earlier.  We'll also try him on horizant in the place of gabapentin since it is a delayed release form.  He is to give me some feedback with the medication changes.

## 2013-05-16 ENCOUNTER — Other Ambulatory Visit: Payer: Self-pay | Admitting: Pulmonary Disease

## 2013-05-28 ENCOUNTER — Encounter: Payer: Self-pay | Admitting: Cardiovascular Disease

## 2013-05-30 ENCOUNTER — Telehealth: Payer: Self-pay | Admitting: Pulmonary Disease

## 2013-05-30 MED ORDER — GABAPENTIN ENACARBIL ER 600 MG PO TBCR: 1.0000 | EXTENDED_RELEASE_TABLET | Freq: Every day | ORAL | Status: DC

## 2013-05-30 NOTE — Telephone Encounter (Signed)
Pt called back and stated he is sleep walking @ night, not hallucinating.  Leanora Ivanoff

## 2013-05-30 NOTE — Telephone Encounter (Signed)
Pt aware of recs and RX for horizant has been sent. Nothing further needed

## 2013-05-30 NOTE — Telephone Encounter (Signed)
I spoke with pt. He stated the horizant has helped. His legs do not bounce around as bad. Per wife his hallucinations are better. He had 1 last night but usually its 3-4 a night. They do notice it is after he takes the requip per spouse. Please advise Dr. Shelle Iron thanks

## 2013-05-30 NOTE — Telephone Encounter (Signed)
Let pt know that I would like to get him off requip slowly, and use the horizant to control his leg movements.  Once off requip, if still having sleep walking and other abnormal behaviors, will start on klonopine to help with this.   Decrease requip to 1 tab ( of the 1mg ) each night after dinner for one week, then decrease to 1/2 tab after dinner for one week, then stop.  Give me some feedback with how things are going once off requip.  Stay on horizant if this works well for him.

## 2013-06-03 ENCOUNTER — Telehealth: Payer: Self-pay | Admitting: *Deleted

## 2013-06-03 NOTE — Telephone Encounter (Signed)
PA initiated with [employer] ER 600mg  Take 1 tablet daily at 5pm Approved until 10/01/2013 Pharmacy aware.

## 2013-08-07 ENCOUNTER — Other Ambulatory Visit: Payer: Self-pay

## 2013-12-08 ENCOUNTER — Encounter: Payer: Self-pay | Admitting: Neurology

## 2013-12-09 ENCOUNTER — Ambulatory Visit (INDEPENDENT_AMBULATORY_CARE_PROVIDER_SITE_OTHER): Payer: Medicare Other | Admitting: Neurology

## 2013-12-09 ENCOUNTER — Encounter: Payer: Self-pay | Admitting: Neurology

## 2013-12-09 VITALS — BP 123/73 | HR 66 | Ht 66.0 in | Wt 166.0 lb

## 2013-12-09 DIAGNOSIS — R4189 Other symptoms and signs involving cognitive functions and awareness: Secondary | ICD-10-CM

## 2013-12-09 DIAGNOSIS — G2581 Restless legs syndrome: Secondary | ICD-10-CM

## 2013-12-09 DIAGNOSIS — F09 Unspecified mental disorder due to known physiological condition: Secondary | ICD-10-CM

## 2013-12-09 NOTE — Patient Instructions (Addendum)
Overall you are doing fairly well but I do want to suggest a few things today:   Remember to drink plenty of fluid, eat healthy meals and do not skip any meals. Try to eat protein with a every meal and eat a healthy snack such as fruit or nuts in between meals. Try to keep a regular sleep-wake schedule and try to exercise daily, particularly in the form of walking, 20-30 minutes a day, if you can.   As far as your medications are concerned, I would like to suggest you continue you on the Requip and Neurontin  As far as diagnostic testing:  1)Please have some blood work completed today after checking out 2)We will call you to schedule a MRI of your brain  I would like to see you back in 3 months, sooner if we need to. Please call us with any interim questions, concerns, problems, updates or refill requests.   Please also call us for any test results so we can go over those with you on the phone.  My clinical assistant and will answer any of your questions and relay your messages to me and also relay most of my messages to you.   Our phone number is [phone]. We also have an after hours call service for urgent matters and there is a physician on-call for urgent questions. For any emergencies you know to call 911 or go to the nearest emergency room
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
## 2013-12-11 LAB — IRON AND TIBC
Iron Saturation: 32 % (ref 15–55)
Iron: 81 ug/dL (ref 40–155)
TIBC: 250 ug/dL (ref 250–450)
UIBC: 169 ug/dL (ref 150–375)

## 2013-12-11 LAB — PROTEIN ELECTROPHORESIS
A/G RATIO SPE: 1.6 (ref 0.7–2.0)
Albumin ELP: 3.9 g/dL (ref 3.2–5.6)
Alpha 1: 0.2 g/dL (ref 0.1–0.4)
Alpha 2: 0.6 g/dL (ref 0.4–1.2)
Beta: 0.8 g/dL (ref 0.6–1.3)
GLOBULIN, TOTAL: 2.4 g/dL (ref 2.0–4.5)
Gamma Globulin: 0.9 g/dL (ref 0.5–1.6)
TOTAL PROTEIN: 6.3 g/dL (ref 6.0–8.5)

## 2013-12-11 LAB — VITAMIN B12: Vitamin B-12: 230 pg/mL (ref 211–946)

## 2013-12-11 LAB — FERRITIN: Ferritin: 103 ng/mL (ref 30–400)

## 2013-12-11 LAB — METHYLMALONIC ACID, SERUM: METHYLMALONIC ACID: 461 nmol/L — AB (ref 0–378)

## 2013-12-15 ENCOUNTER — Telehealth: Payer: Self-pay | Admitting: Neurology

## 2013-12-15 NOTE — Telephone Encounter (Signed)
Patient returning your call, please call him back as soon as possible.

## 2013-12-16 ENCOUNTER — Ambulatory Visit
Admission: RE | Admit: 2013-12-16 | Discharge: 2013-12-16 | Disposition: A | Discharge status: Home / Self Care | Payer: Medicare Other | Source: Ambulatory Visit | Attending: Neurology | Admitting: Neurology

## 2013-12-16 DIAGNOSIS — G2581 Restless legs syndrome: Secondary | ICD-10-CM

## 2013-12-16 DIAGNOSIS — R4189 Other symptoms and signs involving cognitive functions and awareness: Secondary | ICD-10-CM

## 2013-12-16 DIAGNOSIS — R413 Other amnesia: Secondary | ICD-10-CM

## 2013-12-16 NOTE — Telephone Encounter (Signed)
Called and spoke to patients wife patient knows that he is to get get B 12 over the counter. Patients wife still has questions about labs. [phone] Ann.

## 2013-12-17 NOTE — Telephone Encounter (Signed)
Spoke to patient and she had question about Methylmalonic Acid.  Relayed that it was related to B12, she had no further questions.

## 2013-12-17 NOTE — Progress Notes (Signed)
Quick Note:  Informed patient of Dr Minus BreedingSumner's findings, he verbalized understanding ______

## 2014-01-08 ENCOUNTER — Telehealth: Payer: Self-pay | Admitting: Neurology

## 2014-01-08 NOTE — Telephone Encounter (Signed)
Pt's wife called.  She stated that she has noticed real improvement in the patient and his sleeping has gotten better as well.  However, in the late afternoon/evenings or when he gets tired he gets confused and things that he has done before he is no longer sure how to (working with various wood working machines). Mrs. Wesley Pierce is concerned and she and her daughter-in-law have been watching him while he is out in his wood shop.  She was wondering if the B12 shot would improve and manage the confusion problem.  Please call to discuss.  Thank you

## 2014-01-09 NOTE — Telephone Encounter (Signed)
Left message for wife asking if the patient was taking oral B12.  Explained that the doctor will be back Monday and best to speak to him about the patient's confusion.

## 2014-01-16 NOTE — Telephone Encounter (Signed)
Spoke to patient's wife and he is doing better now but still has some periods of confusion especially later in the day, but gets better after eating.  The patient is taking his B12 orally.  She asked that when the doctor comes back if he would call to advise about what to do for the confusion.

## 2014-01-22 NOTE — Telephone Encounter (Signed)
Patient will come 01-23-14 at 10am for B12 injection.

## 2014-01-22 NOTE — Telephone Encounter (Signed)
Called Wesley Pierce to discuss her concerns.   Can you please call her to schedule a once monthly B12 injection (we will plan to do once monthly for 6 months). Thanks.

## 2014-01-23 ENCOUNTER — Ambulatory Visit (INDEPENDENT_AMBULATORY_CARE_PROVIDER_SITE_OTHER): Payer: Medicare Other | Admitting: Neurology

## 2014-01-23 DIAGNOSIS — E538 Deficiency of other specified B group vitamins: Secondary | ICD-10-CM

## 2014-01-23 MED ORDER — CYANOCOBALAMIN 1000 MCG/ML IJ SOLN
1000.0000 ug | INTRAMUSCULAR | Status: AC
  Administered 2014-01-23 – 2014-05-29 (×6): 1000 ug via INTRAMUSCULAR

## 2014-01-23 NOTE — Patient Instructions (Signed)
Patient will return next month for 2nd month of B12.

## 2014-01-23 NOTE — Progress Notes (Signed)
Patient here for B12 injection.  Under aseptic technique Cyanocobalamin 103700mcg/1ml  given IM in left deltoid.  Tolerated well.  Patient received 1st month of 6 months of B12.

## 2014-02-24 ENCOUNTER — Ambulatory Visit (INDEPENDENT_AMBULATORY_CARE_PROVIDER_SITE_OTHER): Payer: Medicare Other | Admitting: Neurology

## 2014-02-24 DIAGNOSIS — E538 Deficiency of other specified B group vitamins: Secondary | ICD-10-CM

## 2014-02-24 NOTE — Progress Notes (Signed)
Patient here for B12 injection, 2nd of 6 monthly B12 injections.  Under aseptic technique Cyanocobalamin given IM in right deltoid.  Tolerated well.  Band-Aid applied.

## 2014-02-24 NOTE — Patient Instructions (Signed)
Patient will return next month for month 3 of 6 monthly B12 injections.

## 2014-03-11 ENCOUNTER — Ambulatory Visit: Payer: Medicare Other | Admitting: Neurology

## 2014-03-27 ENCOUNTER — Ambulatory Visit: Payer: Medicare Other | Admitting: Neurology

## 2014-03-27 ENCOUNTER — Ambulatory Visit: Payer: Medicare Other

## 2014-03-27 ENCOUNTER — Ambulatory Visit (INDEPENDENT_AMBULATORY_CARE_PROVIDER_SITE_OTHER): Payer: Medicare Other | Admitting: *Deleted

## 2014-03-27 DIAGNOSIS — E538 Deficiency of other specified B group vitamins: Secondary | ICD-10-CM

## 2014-03-27 NOTE — Patient Instructions (Signed)
See next month. 

## 2014-03-27 NOTE — Progress Notes (Signed)
Pt here for B 12 injection.  Under aseptic technique cyanocobalamin 1000mcg/1ml IM given L deltoid.  Tolerated well.  Bandaid applied.  

## 2014-05-01 ENCOUNTER — Ambulatory Visit (INDEPENDENT_AMBULATORY_CARE_PROVIDER_SITE_OTHER): Payer: Medicare Other | Admitting: Neurology

## 2014-05-01 ENCOUNTER — Encounter: Payer: Self-pay | Admitting: Neurology

## 2014-05-01 ENCOUNTER — Ambulatory Visit (INDEPENDENT_AMBULATORY_CARE_PROVIDER_SITE_OTHER): Payer: Medicare Other

## 2014-05-01 VITALS — BP 150/65 | HR 63 | Ht 66.0 in | Wt 166.0 lb

## 2014-05-01 DIAGNOSIS — R4189 Other symptoms and signs involving cognitive functions and awareness: Secondary | ICD-10-CM

## 2014-05-01 DIAGNOSIS — E538 Deficiency of other specified B group vitamins: Secondary | ICD-10-CM

## 2014-05-01 DIAGNOSIS — F09 Unspecified mental disorder due to known physiological condition: Secondary | ICD-10-CM

## 2014-05-01 MED ORDER — CARBIDOPA-LEVODOPA 25-100 MG PO TABS: 1.0000 | ORAL_TABLET | Freq: Three times a day (TID) | ORAL | Status: DC

## 2014-05-01 NOTE — Patient Instructions (Signed)
See patient in a month.
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
## 2014-05-01 NOTE — Patient Instructions (Signed)
Overall you are doing fairly well but I do want to suggest a few things today:   Remember to drink plenty of fluid, eat healthy meals and do not skip any meals. Try to eat protein with a every meal and eat a healthy snack such as fruit or nuts in between meals. Try to keep a regular sleep-wake schedule and try to exercise daily, particularly in the form of walking, 20-30 minutes a day, if you can.   As far as your medications are concerned, I would like to suggest the following: 1)Please continue your B12 injections for now 2)I would like you to start Sinemet 25-100. Please take 1/2 tablet three times a day 4 hours apart. Do this for one week and then increase to 1 whole tablet three times a day.   Please call me in 2 to 3 weeks to update me on how your are feeling  Please call us with any interim questions, concerns, problems, updates or refill requests.   My clinical assistant and will answer any of your questions and relay your messages to me and also relay most of my messages to you.   Our phone number is [phone]. We also have an after hours call service for urgent matters and there is a physician on-call for urgent questions. For any emergencies you know to call 911 or go to the nearest emergency room

## 2014-05-01 NOTE — Progress Notes (Signed)
Pt here for B 12 injection.  Under aseptic technique cyanocobalamin 1000mcg/1ml IM given R deltoid.  Tolerated well.  Bandaid applied.  

## 2014-05-01 NOTE — Addendum Note (Signed)
Addended by: Doree BarthelLOWE, CASANDRA on: 05/01/2014 04:21 PM   Modules accepted: Orders

## 2014-05-04 ENCOUNTER — Other Ambulatory Visit: Payer: Self-pay

## 2014-05-12 ENCOUNTER — Ambulatory Visit (INDEPENDENT_AMBULATORY_CARE_PROVIDER_SITE_OTHER): Payer: Medicare Other | Admitting: Pulmonary Disease

## 2014-05-12 ENCOUNTER — Encounter: Payer: Self-pay | Admitting: Pulmonary Disease

## 2014-05-12 VITALS — BP 110/70 | HR 57 | Temp 98.1°F | Ht 66.0 in | Wt 164.4 lb

## 2014-05-12 DIAGNOSIS — J439 Emphysema, unspecified: Secondary | ICD-10-CM

## 2014-05-12 DIAGNOSIS — J438 Other emphysema: Secondary | ICD-10-CM

## 2014-05-12 DIAGNOSIS — G2581 Restless legs syndrome: Secondary | ICD-10-CM

## 2014-05-12 NOTE — Assessment & Plan Note (Signed)
The patient is taking Requip and doing fairly well from a restless leg syndrome standpoint. The wife states that he no longer kicks during the night.

## 2014-05-12 NOTE — Assessment & Plan Note (Signed)
The patient has done well on Foradil alone, and feels that his exertional tolerance is at baseline. I have asked him to continue on this medication

## 2014-05-12 NOTE — Patient Instructions (Addendum)
Continue on your breathing medication, and stay active Stay on your requip followup with me again in one year.

## 2014-05-12 NOTE — Progress Notes (Signed)
   Subjective:    Patient ID: Wesley Pierce, male    DOB: 07-14-1931, 80 y.o.   MRN: [id number]  HPI The patient comes in today for followup of his known COPD. He also has isolated interstitial disease in the right base after an inflammatory process that was self limiting, as well as the restless leg syndrome for which she is on Requip. He feels that he is doing extremely well from a breathing standpoint on his current medications, and has not had any significant cough or mucus. He feels that his exertional tolerance is at baseline. He also feels that his restless legs are reasonably controlled, but is having some neuropathy type discomfort in his left lower extremity.   Review of Systems  Constitutional: Negative for fever and unexpected weight change.  HENT: Negative for congestion, dental problem, ear pain, nosebleeds, postnasal drip, rhinorrhea, sinus pressure, sneezing, sore throat and trouble swallowing.   Eyes: Negative for redness and itching.  Respiratory: Negative for cough, chest tightness, shortness of breath and wheezing.   Cardiovascular: Negative for palpitations and leg swelling.  Gastrointestinal: Negative for nausea and vomiting.  Genitourinary: Negative for dysuria.  Musculoskeletal: Negative for joint swelling.  Skin: Negative for rash.  Neurological: Negative for headaches.  Hematological: Does not bruise/bleed easily.  Psychiatric/Behavioral: Negative for dysphoric mood. The patient is not nervous/anxious.        Objective:   Physical Exam Well-developed male in no acute distress Nose without purulence or discharge noted Neck without lymphadenopathy or thyromegaly Chest with right basilar crackles noted which is chronic, no wheezes or rhonchi Cardiac exam with regular rate and rhythm Lower extremities without edema, no cyanosis Alert and oriented, moves all 4 extremities.       Assessment & Plan:

## 2014-05-19 ENCOUNTER — Telehealth: Payer: Self-pay | Admitting: Neurology

## 2014-05-19 NOTE — Telephone Encounter (Signed)
FYI, please review previous message.

## 2014-05-19 NOTE — Telephone Encounter (Signed)
Patient's wife calling to state that patient has had a positive result with the carvidopa/levodopa - he is doing very well on this medication with no side effects.

## 2014-05-29 ENCOUNTER — Ambulatory Visit (INDEPENDENT_AMBULATORY_CARE_PROVIDER_SITE_OTHER): Payer: Medicare Other | Admitting: *Deleted

## 2014-05-29 DIAGNOSIS — E538 Deficiency of other specified B group vitamins: Secondary | ICD-10-CM

## 2014-05-29 NOTE — Patient Instructions (Signed)
Patient will return in one month for next injection. 

## 2014-05-29 NOTE — Progress Notes (Signed)
Pt here for B 12 injection.  Under aseptic technique cyanocobalamin 1000mcg/1ml IM given L Deltoid.   Tolerated well.  Bandaid applied.  

## 2014-06-01 ENCOUNTER — Other Ambulatory Visit: Payer: Self-pay | Admitting: Pulmonary Disease

## 2014-06-29 ENCOUNTER — Ambulatory Visit: Payer: Medicare Other

## 2014-07-02 ENCOUNTER — Ambulatory Visit (INDEPENDENT_AMBULATORY_CARE_PROVIDER_SITE_OTHER): Payer: Medicare Other | Admitting: *Deleted

## 2014-07-02 DIAGNOSIS — E538 Deficiency of other specified B group vitamins: Secondary | ICD-10-CM

## 2014-07-02 MED ORDER — CYANOCOBALAMIN 1000 MCG/ML IJ SOLN
1000.0000 ug | Freq: Once | INTRAMUSCULAR | Status: AC
  Administered 2014-07-02: 1000 ug via INTRAMUSCULAR

## 2014-07-02 NOTE — Patient Instructions (Signed)
Patient will follow up in 1 month for next injection.

## 2014-07-02 NOTE — Progress Notes (Signed)
Patient given B-12 injection 1000 mcg/221mL Lt Deltoid. Patient tolerated procedure well, bandaid applied.

## 2014-07-02 NOTE — Progress Notes (Signed)
Patient here for Vitamin B-12 injection. Injection given under aseptic technique cyanocobalamin 100 mcg/mL in Lt deltoid. Well tolerated, bandaid applied. Patient had Flu shot on Monday in Rt arm, so asked to have in Left arm. He will alternate to Right arm next B-12 injection.

## 2014-07-31 ENCOUNTER — Ambulatory Visit: Payer: Medicare Other | Admitting: Neurology

## 2014-08-03 ENCOUNTER — Encounter: Payer: Self-pay | Admitting: Neurology

## 2014-08-03 ENCOUNTER — Ambulatory Visit (INDEPENDENT_AMBULATORY_CARE_PROVIDER_SITE_OTHER): Payer: Self-pay

## 2014-08-03 ENCOUNTER — Ambulatory Visit (INDEPENDENT_AMBULATORY_CARE_PROVIDER_SITE_OTHER): Payer: Medicare Other | Admitting: Neurology

## 2014-08-03 VITALS — BP 154/83 | HR 59 | Temp 97.0°F | Ht 66.0 in | Wt 153.5 lb

## 2014-08-03 DIAGNOSIS — E538 Deficiency of other specified B group vitamins: Secondary | ICD-10-CM

## 2014-08-03 DIAGNOSIS — G20 Parkinson's disease: Secondary | ICD-10-CM

## 2014-08-03 DIAGNOSIS — Z0289 Encounter for other administrative examinations: Secondary | ICD-10-CM

## 2014-08-03 MED ORDER — RIVASTIGMINE TARTRATE 1.5 MG PO CAPS: 1.5000 mg | ORAL_CAPSULE | Freq: Two times a day (BID) | ORAL | Status: DC

## 2014-08-03 MED ORDER — CYANOCOBALAMIN 1000 MCG/ML IJ SOLN
1000.0000 ug | Freq: Once | INTRAMUSCULAR | Status: AC
  Administered 2014-08-03: 1000 ug via INTRAMUSCULAR

## 2014-08-03 NOTE — Progress Notes (Addendum)
[facility]    Provider:  Dr Lucia Gaskins Referring Provider: Minda Meo, MD Primary Care Physician:  Minda Meo, MD  CC:  Parkinson's disease, RLS and memory changes.   HPI:  Wesley Pierce is a 80 y.o. male here as a referral from Dr. Jacky Kindle for Parkinson's disease, RLS and memory changes. He is 80 years old and is a former patient of Dr. Hosie Poisson and is transitioning to me. He was found to have B12 deficiency and started on B12 injections. At last appointment was starte don low-dose Sinemet and titrated to one pill three times daily.  Wife provides most history and updates. Things are gong very well. He is a "whole different person" when he is on the medicine. Can see a significant change and can tell when it wears off. They are taking the medications at 8:30am and is taking it 3x a day a whole pill. Tremor is improving and helps significantly. Still taking the injections. He has some sleeping problems. He takes Ropinerole and gabapentin at night. Usually he sleeps well when he gets to sleep. But he has some difficulty initiating sleep.   Currently taking Neurontin and Requip at bedtime for possible RLS. Is now currently taking Requip 1.5mg  and Gabapentin 300mg  nightly. Tried Gabapentin 600mg  and had hallucinations. Had tried higher dose of Requip but also suffered from hallucinations.   Has had a few TIAs in the past. No strokes. Otherwise healthy.   No family history of parkinson's or other neurodegenerative disorders   Basic lab work, including TSH, reviewed from PCP and was unremarkable.   Review of Systems: Patient complains of symptoms per HPI as well as the following symptoms Cough, constipation, urination frequency, memory loss, dizziness, speech difficulty, insomnia, confusion. Pertinent negatives per HPI. All others negative.   History   Social History  . Marital Status: Married    Spouse Name: Wesley Pierce    Number of Children: 3  . Years of  Education: AA degree   Occupational History  . retired from [employer]    Social History Main Topics  . Smoking status: Never Smoker   . Smokeless tobacco: Never Used  . Alcohol Use: No  . Drug Use: No  . Sexual Activity: Not on file   Other Topics Concern  . Not on file   Social History Narrative   Patient is married to Wesley Pierce), has 3 children    Patient is right handed   Education is AA degree   Caffeine consumption is 1 cup daily    Family History  Problem Relation Age of Onset  . Allergies Brother   . Heart disease Sister   . Stroke Sister   . Clotting disorder Sister   . Alzheimer's disease Father   . Dementia Father     Past Medical History  Diagnosis Date  . Osteoarthritis   . Hyperlipidemia   . GERD (gastroesophageal reflux disease)   . Restless legs syndrome (RLS)   . Interstitial lung disease   . Pleural thickening   . Hemorrhoids   . Cognitive decline   . Parkinson disease   . B12 deficiency   . Memory loss     Past Surgical History  Procedure Laterality Date  . Cataract extraction  03/2008  . Lung decortication  1962  . Colonoscopy    . Hemorrhoid surgery  06/21/2012    Procedure: HEMORRHOIDECTOMY;  Surgeon: Clovis Pu. Cornett, MD;  Location: [facility];  Service: General;  Laterality: N/A;  .  Examination under anesthesia  08/27/2012    Procedure: EXAM UNDER ANESTHESIA;  Surgeon: Maisie Fushomas A. Cornett, MD;  Location: [facility];  Service: General;  Laterality: N/A;    Current Outpatient Prescriptions  Medication Sig Dispense Refill  . acetaminophen (TYLENOL) 325 MG tablet Take 650 mg by mouth as needed.      Marland Kitchen. aspirin 81 MG tablet Take 81 mg by mouth daily.      . carbidopa-levodopa (SINEMET IR) 25-100 MG per tablet Take 1 tablet by mouth 3 (three) times daily. 90 tablet 6  . Docusate Sodium (COLACE PO) Take by mouth.    . ergocalciferol (VITAMIN D2) 50000 UNITS capsule Take 50,000 Units by mouth once a week.      .  fluticasone (FLONASE) 50 MCG/ACT nasal spray 2 sprays by Nasal route daily.      Marland Kitchen. FORADIL AEROLIZER 12 MCG capsule for inhaler place 1 capsule INTO INHALER AND INHALE TWICE DAILY 60 capsule 11  . gabapentin (NEURONTIN) 300 MG capsule Take 300 mg by mouth daily.     Marland Kitchen. guaiFENesin (MUCINEX) 600 MG 12 hr tablet Take 600 mg by mouth as needed.     . Polyethylene Glycol 3350 GRAN by Does not apply route.    Marland Kitchen. RAPAFLO 8 MG CAPS capsule     . rOPINIRole (REQUIP) 1 MG tablet Take 1 mg by mouth at bedtime. Takes 1 1/2 tab QHS    . vitamin B-12 (CYANOCOBALAMIN) 1000 MCG tablet Take 1,000 mcg by mouth daily.    . rivastigmine (EXELON) 1.5 MG capsule Take 1 capsule (1.5 mg total) by mouth 2 (two) times daily. 60 capsule 3   No current facility-administered medications for this visit.    Allergies as of 08/03/2014 - Review Complete 08/03/2014  Allergen Reaction Noted  . Advair diskus [fluticasone-salmeterol]  05/16/2012  . Aloe Swelling 07/22/2012  . Ambien [zolpidem tartrate] Other (See Comments) 05/01/2012  . Halcion [triazolam]  08/03/2014  . Horizant [gabapentin]  08/03/2014  . Lyrica [pregabalin] Other (See Comments) 05/01/2012  . Sulfonamide derivatives    . Ciprofloxacin Rash     Vitals: BP 154/83 mmHg  Pulse 59  Temp(Src) 97 F (36.1 C)  Ht 5\' 6"  (1.676 m)  Wt 153 lb 8 oz (69.627 kg)  BMI 24.79 kg/m2 Last Weight:  Wt Readings from Last 1 Encounters:  08/03/14 153 lb 8 oz (69.627 kg)   Last Height:   Ht Readings from Last 1 Encounters:  08/03/14 5\' 6"  (1.676 m)     Physical exam: Exam: Gen: NAD, conversant, well nourised, obese, well groomed                     CV: RRR, no MRG. No Carotid Bruits. No peripheral edema, warm, nontender Eyes: Conjunctivae clear without exudates or hemorrhage  Neuro: Detailed Neurologic Exam  Speech:    Speech is normal; fluent and spontaneous with normal comprehension.  Cognition:    The patient is oriented to person, place, and time;      recent and remote memory intact;     language fluent;     normal attention, concentration,     fund of knowledge Cranial Nerves:    The pupils are equal, round, and reactive to light. The fundi are flat Visual fields are full to finger confrontation. Extraocular movements are intact. Trigeminal sensation is intact and the muscles of mastication are normal. The face is symmetric. Decreased blink reflex. The palate elevates in the midline. Voice  is normal. Shoulder shrug is normal. The tongue has normal motion without fasciculations.   Coordination: intermittent rest tremor noted in right hand (including with distraction), mild postural tremor L>RUE, mild bradykinesia with finger tapping bilateral, normal foot taps bilat.  Gait:   stands without assistance, upright, slight shuffling of steps, normal arm swing, negative Romberg, negative pull test  Tone:    Increased on the right with facilitation    Posture:    Posture is normal.     Strength:    Strength is V/V in the upper and lower limbs.      Sensation: intact to LT     Reflex Exam:  DTR's:    Deep tendon reflexes in the upper and lower extremities are normal bilaterally.   Toes:    The toes are downgoing bilaterally.   Clonus:    Clonus is absent.     Assessment/Plan:  80y/o gentleman presenting for follow up of cognitive decline and question of a parkinsonism. Physical exam pertinent for MOCA of 19/30 in July and 20/30 today, recent blood work pertinent for a B12 deficiency. He is currently receiving B12 injections. Will continue B12 injections. Sinemet making him a "whole different person" working extremely well, improved shuffling and tremor.  Will continue B12 injections and Sinemet. Will continue Neupro for RLS. Can try Melatonin 1-2mg  at 6pm at night for insomnia. No REM sleep disorder. Continue Sinement. Follow up 3 months.   Wesley DeanAntonia Ahern, MD  [facility] [address] [place], [place] [id number]  Phone [phone] Fax [phone]   Increased tone more on the right.  Wesley DukesWILLIS,Wesley Pierce

## 2014-08-03 NOTE — Patient Instructions (Signed)
Overall you are doing fairly well but I do want to suggest a few things today:   Remember to drink plenty of fluid, eat healthy meals and do not skip any meals. Try to eat protein with a every meal and eat a healthy snack such as fruit or nuts in between meals. Try to keep a regular sleep-wake schedule and try to exercise daily, particularly in the form of walking, 20-30 minutes a day, if you can.   As far as your medications are concerned, I would like to suggest: Continue current dose of Carbidopa/Levodopa. -Try to separate Sinemet from food (especially protein-rich foods like meat, dairy, eggs) by about 30-60 mins - this will help the absorption of the medication. If you have some nausea with the medication, you can take it with some light food like crackers or ginger ale.  Will try Exelon once daily for memory disorder.  For insomnia try Melatonin 1mg  or 2mg  at 6pm in the evenings.  I would like to see you back in 3-4 months, sooner if we need to. Please call us with any interim questions, concerns, problems, updates or refill requests.   Please also call us for any test results so we can go over those with you on the phone.  My clinical assistant and will answer any of your questions and relay your messages to me and also relay most of my messages to you.   Our phone number is [phone]. We also have an after hours call service for urgent matters and there is a physician on-call for urgent questions. For any emergencies you know to call 911 or go to the nearest emergency room

## 2014-08-04 ENCOUNTER — Encounter: Payer: Self-pay | Admitting: Neurology

## 2014-08-12 ENCOUNTER — Other Ambulatory Visit: Payer: Self-pay | Admitting: Neurology

## 2014-08-12 DIAGNOSIS — E538 Deficiency of other specified B group vitamins: Secondary | ICD-10-CM

## 2014-08-14 ENCOUNTER — Telehealth: Payer: Self-pay | Admitting: *Deleted

## 2014-08-14 ENCOUNTER — Other Ambulatory Visit (INDEPENDENT_AMBULATORY_CARE_PROVIDER_SITE_OTHER): Payer: Self-pay

## 2014-08-14 DIAGNOSIS — Z0289 Encounter for other administrative examinations: Secondary | ICD-10-CM

## 2014-08-14 DIAGNOSIS — E538 Deficiency of other specified B group vitamins: Secondary | ICD-10-CM

## 2014-08-14 NOTE — Telephone Encounter (Signed)
[facility]14, pt foradil will no longer be covered. Alternatives are Serevent diskus or spiriva  Please advise KC thanks  Allergies  Allergen Reactions  . Advair Diskus [Fluticasone-Salmeterol]     anxiety  . Aloe Swelling  . Ambien [Zolpidem Tartrate] Other (See Comments)    Sleep walking  . Halcion [Triazolam]     Sleep Walking  . Horizant [Gabapentin]     Sleep walking and halucinations  . Lyrica [Pregabalin] Other (See Comments)    Sleep walking   . Sulfonamide Derivatives     Pt unable to remember  . Ciprofloxacin Rash

## 2014-08-14 NOTE — Telephone Encounter (Signed)
See if foradil, arcapta, and striverdi are covered.  spiriva isn't even the same medication.

## 2014-08-17 LAB — METHYLMALONIC ACID, SERUM: Methylmalonic Acid: 71 nmol/L (ref 0–378)

## 2014-08-17 LAB — VITAMIN B12: Vitamin B-12: 1427 pg/mL — ABNORMAL HIGH (ref 211–946)

## 2014-08-17 NOTE — Telephone Encounter (Signed)
Called made pt aware of below. He voiced understanding and will call his insurance. Nothing further needed

## 2014-08-18 ENCOUNTER — Telehealth: Payer: Self-pay | Admitting: Pulmonary Disease

## 2014-08-18 NOTE — Telephone Encounter (Signed)
Called and spoke with pt and he stated that he called and spoke with his insurance company and they stated that the foradil is not covered and there were no other medications that they could give him that are covered.  Pt wanted to call back and see what KC wanted to do.  KC please advise. Thanks  Allergies  Allergen Reactions  . Advair Diskus [Fluticasone-Salmeterol]     anxiety  . Aloe Swelling  . Ambien [Zolpidem Tartrate] Other (See Comments)    Sleep walking  . Halcion [Triazolam]     Sleep Walking  . Horizant [Gabapentin]     Sleep walking and halucinations  . Lyrica [Pregabalin] Other (See Comments)    Sleep walking   . Sulfonamide Derivatives     Pt unable to remember  . Ciprofloxacin Rash    Current Outpatient Prescriptions on File Prior to Visit  Medication Sig Dispense Refill  . acetaminophen (TYLENOL) 325 MG tablet Take 650 mg by mouth as needed.      Marland Kitchen. aspirin 81 MG tablet Take 81 mg by mouth daily.      . carbidopa-levodopa (SINEMET IR) 25-100 MG per tablet Take 1 tablet by mouth 3 (three) times daily. 90 tablet 6  . Docusate Sodium (COLACE PO) Take by mouth.    . ergocalciferol (VITAMIN D2) 50000 UNITS capsule Take 50,000 Units by mouth once a week.      . fluticasone (FLONASE) 50 MCG/ACT nasal spray 2 sprays by Nasal route daily.      Marland Kitchen. FORADIL AEROLIZER 12 MCG capsule for inhaler place 1 capsule INTO INHALER AND INHALE TWICE DAILY 60 capsule 11  . gabapentin (NEURONTIN) 300 MG capsule Take 300 mg by mouth daily.     Marland Kitchen. guaiFENesin (MUCINEX) 600 MG 12 hr tablet Take 600 mg by mouth as needed.     . Polyethylene Glycol 3350 GRAN by Does not apply route.    Marland Kitchen. RAPAFLO 8 MG CAPS capsule     . rivastigmine (EXELON) 1.5 MG capsule Take 1 capsule (1.5 mg total) by mouth 2 (two) times daily. 60 capsule 3  . rOPINIRole (REQUIP) 1 MG tablet Take 1 mg by mouth at bedtime. Takes 1 1/2 tab QHS    . vitamin B-12 (CYANOCOBALAMIN) 1000 MCG tablet Take 1,000 mcg by mouth daily.      No current facility-administered medications on file prior to visit.

## 2014-08-18 NOTE — Telephone Encounter (Signed)
lmtcb for pt.  

## 2014-08-18 NOTE — Telephone Encounter (Signed)
Why don't we call the insurance and find out for sure this is the case.  See if arcapta, striverdi, serevent, are covered.

## 2014-08-18 NOTE — Telephone Encounter (Signed)
Pt returned call & can be reached at [phone].  Antionette FairyHolly D Pryor

## 2014-08-19 ENCOUNTER — Telehealth: Payer: Self-pay | Admitting: Neurology

## 2014-08-19 NOTE — Telephone Encounter (Signed)
[facility]leinFelicia [facility] obtain PA phone # and ID # >> [phone], ID [id number]  Called [facility] and spoke with Lurena Joinerebecca.  Nothing in their system showing pt's Foradil needs PA either now or in Jan 2016.  She does show where pt has tried to fill his Foradil too soon (next refill is due on or after 12/9).  [facility] Almira CoasterGina.  Almira CoasterGina not surprised that [facility]'s rep was unable to see the upcoming need for PA on pt's Foradil.  Almira CoasterGina reported that pt has not been taking his Foradil regularly and has been helping pt to refill this medication 1 week early each month to get "a store" of the Foradil until the issue can be resolved in Jan 2016.    Called spoke with pt's spouse.  She had the letter in her hand and read that the only 2 covered alternatives to the Foradil are Spiriva and Serevent [facility](KC is already aware of these options per the 11.13.15 phone note).  Advised Mrs. Shewell to call the office after the New Year so that hopefully [facility] will have their systems updated and we can revisit the PA issue for pt's Foradil.  Mrs. Vallarie MareHennings is happy with this solution and will call in Jan 2016.  Will sign and forward to [facility] as FYI.

## 2014-08-19 NOTE — Telephone Encounter (Signed)
Spoke to patient and let him know his B12 and mma improved. He should continue to take oral B12 daily and we can recheck at next appointment.

## 2014-09-01 ENCOUNTER — Telehealth: Payer: Self-pay | Admitting: Neurology

## 2014-09-01 NOTE — Telephone Encounter (Signed)
Wife is stating that patient has started sleep walking around 2 or 3 in the morning and this is starting some concern with her.  She is not sure what to do. Not sure if it is due to the medication, he has stop taking the Melatonin.  Please call back and advise.

## 2014-09-02 NOTE — Telephone Encounter (Signed)
I called and let patient know to stop taking the melatonin, left a message as no one answered.   Shanda BumpsJessica - would you mind calling these nice patients tomorrow and seeing if they started any new medication since seeing me or if it was just the melatonin?   Thank you!

## 2014-09-03 NOTE — Telephone Encounter (Signed)
I called back.  Spoke with Ms Theil.  She said the patient did start taking Exelon capsules about 3-4 days after he began Melatonin, but the sleepwalking occurred prior to beginning Exelon.  Says he has discontinued Melatonin, and although he did not sleep very well, the sleepwalking has ceased.  She was very appreciative, and wanted to thank Dr Lucia GaskinsAhern for her help.

## 2014-11-03 ENCOUNTER — Ambulatory Visit (INDEPENDENT_AMBULATORY_CARE_PROVIDER_SITE_OTHER): Payer: Medicare Other | Admitting: Neurology

## 2014-11-03 ENCOUNTER — Encounter: Payer: Self-pay | Admitting: Neurology

## 2014-11-03 VITALS — BP 131/69 | HR 58 | Ht 65.0 in | Wt 160.0 lb

## 2014-11-03 DIAGNOSIS — G609 Hereditary and idiopathic neuropathy, unspecified: Secondary | ICD-10-CM

## 2014-11-03 DIAGNOSIS — G20 Parkinson's disease: Secondary | ICD-10-CM

## 2014-11-03 DIAGNOSIS — E538 Deficiency of other specified B group vitamins: Secondary | ICD-10-CM

## 2014-11-03 DIAGNOSIS — R7303 Prediabetes: Secondary | ICD-10-CM

## 2014-11-03 DIAGNOSIS — R7309 Other abnormal glucose: Secondary | ICD-10-CM

## 2014-11-03 DIAGNOSIS — G20A1 Parkinson's disease without dyskinesia, without mention of fluctuations: Secondary | ICD-10-CM

## 2014-11-03 DIAGNOSIS — G4752 REM sleep behavior disorder: Secondary | ICD-10-CM

## 2014-11-03 MED ORDER — CLONAZEPAM 0.125 MG PO TBDP: 0.2500 mg | ORAL_TABLET | Freq: Every day | ORAL | Status: DC

## 2014-11-03 NOTE — Progress Notes (Signed)
[facility]    Provider:  Dr Lucia Gaskins Referring Provider: Minda Meo, MD Primary Care Physician:  Minda Meo, MD  CC: Parkinson's disease, RLS and memory changes.  Interval update 11/03/2014:  Patient is sleepwalking. He wakes up and moves. For example, he told his wife he was looking for a lantern. A lot of times he remembers. Usually wife will ask a question and he can give her an answer. Sometimes there is a little agitation, may hit the bed. Agitation is rare, mostly confusional wandering. Last night he was in the kitchen looking for water.   His feet are hurting. The bottoms are tingly. He has to stand up and walk around. He tries to sit down and read which helps some. The bottom of his right foot feels sore. It has been going on for 5 years.   Initial visit 08/03/2014: Wesley Pierce is a 81 y.o. male here as a referral from Dr. Jacky Kindle for Parkinson's disease, RLS and memory changes. He is 81 years old and is a former patient of Dr. Hosie Poisson and is transitioning to me. He was found to have B12 deficiency and started on B12 injections. At last appointment was starte don low-dose Sinemet and titrated to one pill three times daily.  Wife provides most history and updates. Things are gong very well. He is a "whole different person" when he is on the medicine. Can see a significant change and can tell when it wears off. They are taking the medications at 8:30am and is taking it 3x a day a whole pill. Tremor is improving and helps significantly. Still taking the injections. He has some sleeping problems. He takes Ropinerole and gabapentin at night. Usually he sleeps well when he gets to sleep. But he has some difficulty initiating sleep.   Currently taking Neurontin and Requip at bedtime for possible RLS. Is now currently taking Requip 1.5mg  and Gabapentin  nightly. Tried Gabapentin  and had hallucinations. Had tried higher dose of Requip but also  suffered from hallucinations.   Has had a few TIAs in the past. No strokes. Otherwise healthy.   No family history of parkinson's or other neurodegenerative disorders   Basic lab work, including TSH, reviewed from PCP and was unremarkable.  Review of Systems: Patient complains of symptoms per HPI as well as the following symptoms: eye itching, light sensitive, urination frequency. Pertinent negatives per HPI. All others negative.   History   Social History  . Marital Status: Married    Spouse Name: [name]    Number of Children: 3  . Years of Education: AA degree   Occupational History  . retired from [employer]    Social History Main Topics  . Smoking status: Never Smoker   . Smokeless tobacco: Never Used  . Alcohol Use: No  . Drug Use: No  . Sexual Activity: Not on file   Other Topics Concern  . Not on file   Social History Narrative   Patient is married to [name]), has 3 children    Patient is right handed   Education is AA degree   Caffeine consumption is 1 cup daily    Family History  Problem Relation Age of Onset  . Allergies Brother   . Heart disease Sister   . Stroke Sister   . Clotting disorder Sister   . Alzheimer's disease Father   . Dementia Father     Past Medical History  Diagnosis Date  . Osteoarthritis   . Hyperlipidemia   .  GERD (gastroesophageal reflux disease)   . Restless legs syndrome (RLS)   . Interstitial lung disease   . Pleural thickening   . Hemorrhoids   . Cognitive decline   . Parkinson disease   . B12 deficiency   . Memory loss     Past Surgical History  Procedure Laterality Date  . Cataract extraction  03/2008  . Lung decortication  1962  . Colonoscopy    . Hemorrhoid surgery  06/21/2012    Procedure: HEMORRHOIDECTOMY;  Surgeon: Clovis Pu. Cornett, MD;  Location: [facility];  Service: General;  Laterality: N/A;  . Examination under anesthesia  08/27/2012    Procedure: EXAM UNDER ANESTHESIA;  Surgeon: Maisie Fus A.  Cornett, MD;  Location: [facility];  Service: General;  Laterality: N/A;    Current Outpatient Prescriptions  Medication Sig Dispense Refill  . acetaminophen (TYLENOL) 325 MG tablet Take 650 mg by mouth as needed.      Marland Kitchen aspirin 81 MG tablet Take 81 mg by mouth daily.      . carbidopa-levodopa (SINEMET IR) 25-100 MG per tablet Take 1 tablet by mouth 3 (three) times daily. 90 tablet 6  . Docusate Sodium (COLACE PO) Take by mouth.    . ergocalciferol (VITAMIN D2) 50000 UNITS capsule Take 50,000 Units by mouth once a week.      . fluticasone (FLONASE) 50 MCG/ACT nasal spray 2 sprays by Nasal route daily.      Marland Kitchen FORADIL AEROLIZER 12 MCG capsule for inhaler place 1 capsule INTO INHALER AND INHALE TWICE DAILY 60 capsule 11  . gabapentin (NEURONTIN) 300 MG capsule Take 300 mg by mouth daily.     Marland Kitchen guaiFENesin (MUCINEX) 600 MG 12 hr tablet Take 600 mg by mouth as needed.     . Polyethylene Glycol 3350 GRAN by Does not apply route.    Marland Kitchen RAPAFLO 8 MG CAPS capsule     . rOPINIRole (REQUIP) 1 MG tablet Take 1 mg by mouth at bedtime. Takes 1 1/2 tab QHS    . vitamin B-12 (CYANOCOBALAMIN) 1000 MCG tablet Take 1,000 mcg by mouth daily.    . clonazepam (KLONOPIN) 0.125 MG disintegrating tablet Take 2 tablets (0.25 mg total) by mouth at bedtime. 30 tablet 5  . rivastigmine (EXELON) 1.5 MG capsule Take 1 capsule (1.5 mg total) by mouth 2 (two) times daily. (Patient not taking: Reported on 11/03/2014) 60 capsule 3   No current facility-administered medications for this visit.    Allergies as of 11/03/2014 - Review Complete 11/03/2014  Allergen Reaction Noted  . Advair diskus [fluticasone-salmeterol]  05/16/2012  . Aloe Swelling 07/22/2012  . Ambien [zolpidem tartrate] Other (See Comments) 05/01/2012  . Halcion [triazolam]  08/03/2014  . Horizant [gabapentin]  08/03/2014  . Lyrica [pregabalin] Other (See Comments) 05/01/2012  . Sulfonamide derivatives    . Ciprofloxacin Rash      Vitals: BP 131/69 mmHg  Pulse 58  Ht  (1.651 m)  Wt 160 lb (72.576 kg)  BMI 26.63 kg/m2 Last Weight:  Wt Readings from Last 1 Encounters:  11/03/14 160 lb (72.576 kg)   Last Height:   Ht Readings from Last 1 Encounters:  11/03/14  (1.651 m)     Sensory exam: Decrease temperature and vibration distally in the great toes )4 sec vibration). Intact pin prick and proprioception.     Assessment/Plan:  81y/o gentleman presenting for follow up of cognitive decline and question of a parkinsonism. Physical exam pertinent for [facility]  of 19/30 in July and 20/30 in November, recent blood work pertinent for a B12 deficiency. He was receiving B12 injections and is now on Oral and will need to check levels again today since stopping injections and starting orals to ensure he absorbs the B12. Sinemet making him a "whole different person" working extremely well, improved shuffling and tremor. somnambulism vs REM sleep disorder? Will try very small dose of clonazepam at bedtime.  Continue Sinement. Will order neuropathy serum workup. Can take neurontin tid for paresthesias. Follow up 3 months   Naomie DeanAntonia Riccardo Holeman, MD  [facility] [address] [place], [place] [id number]  Phone [phone] Fax [phone]  A total of 30 minutes was spent in with this patient. Over half this time was spent on counseling patient on the parkinson's, neuropathy diagnosis and different diagnostic and therapeutic options available.

## 2014-11-03 NOTE — Patient Instructions (Signed)
Overall you are doing fairly well but I do want to suggest a few things today:   Remember to drink plenty of fluid, eat healthy meals and do not skip any meals. Try to eat protein with a every meal and eat a healthy snack such as fruit or nuts in between meals. Try to keep a regular sleep-wake schedule and try to exercise daily, particularly in the form of walking, 20-30 minutes a day, if you can.   As far as your medications are concerned, I would like to suggest: Start with one tab Clonazepam before bed. Can increase to 2 tabs as tolerated or needed. Can take Neurontin three times daily. Start with 1/2 tab and can increase to whole tab   As far as diagnostic testing: Labs today  I would like to see you back in 4 months, sooner if we need to. Please call us with any interim questions, concerns, problems, updates or refill requests.   Please also call us for any test results so we can go over those with you on the phone.  My clinical assistant and will answer any of your questions and relay your messages to me and also relay most of my messages to you.   Our phone number is [phone]. We also have an after hours call service for urgent matters and there is a physician on-call for urgent questions. For any emergencies you know to call 911 or go to the nearest emergency room

## 2014-11-06 ENCOUNTER — Telehealth: Payer: Self-pay | Admitting: *Deleted

## 2014-11-06 LAB — IFE AND PE, SERUM
ALPHA 1: 0.2 g/dL (ref 0.1–0.4)
Albumin SerPl Elph-Mcnc: 4.1 g/dL (ref 3.2–5.6)
Albumin/Glob SerPl: 1.7 (ref 0.7–2.0)
Alpha2 Glob SerPl Elph-Mcnc: 0.6 g/dL (ref 0.4–1.2)
B-GLOBULIN SERPL ELPH-MCNC: 0.8 g/dL (ref 0.6–1.3)
Gamma Glob SerPl Elph-Mcnc: 1 g/dL (ref 0.5–1.6)
Globulin, Total: 2.5 g/dL (ref 2.0–4.5)
IGA/IMMUNOGLOBULIN A, SERUM: 173 mg/dL (ref 91–414)
IGG (IMMUNOGLOBIN G), SERUM: 998 mg/dL (ref 700–1600)
IgM (Immunoglobulin M), Srm: 108 mg/dL (ref 40–230)
TOTAL PROTEIN: 6.6 g/dL (ref 6.0–8.5)

## 2014-11-06 LAB — TSH: TSH: 1.08 u[IU]/mL (ref 0.450–4.500)

## 2014-11-06 LAB — B12 AND FOLATE PANEL
Folate: 14.5 ng/mL (ref >3.0)
Vitamin B-12: 1116 pg/mL — ABNORMAL HIGH (ref 211–946)

## 2014-11-06 LAB — HEMOGLOBIN A1C
Est. average glucose Bld gHb Est-mCnc: 120 mg/dL
Hgb A1c MFr Bld: 5.8 % — ABNORMAL HIGH (ref 4.8–5.6)

## 2014-11-06 LAB — ANA W/REFLEX: Anti Nuclear Antibody(ANA): NEGATIVE

## 2014-11-06 NOTE — Telephone Encounter (Signed)
-----   Message from Anson FretAntonia B Ahern, MD sent at 11/04/2014  6:04 PM EST ----- Please let patient know his B12 value was normal and his TSH was normal. He should continue to take the oral B12 replacement. We will retest his B12 in about 4-6 months. Thank you.

## 2014-11-06 NOTE — Telephone Encounter (Signed)
Talked with patient about normal values for his B12 and TSH. Advised to continue taking his oral B12 replacement and that he will be retested in 4-6 months. Patient verbalized understanding.

## 2014-11-09 ENCOUNTER — Telehealth: Payer: Self-pay | Admitting: *Deleted

## 2014-11-09 NOTE — Telephone Encounter (Signed)
-----   Message from Antonia B Ahern, MD sent at 11/09/2014  4:32 PM EST ----- Please let patient know his labs were within normal limits. HgbA1c was 5.8 which is slightly elevated, I am not concerned with it just ask him to watch his diet Thank you. 

## 2014-11-09 NOTE — Telephone Encounter (Signed)
Talked with patient about lab results. Patient verbalized understanding. Advised to eat a well-balanced diet.

## 2014-11-09 NOTE — Telephone Encounter (Signed)
-----   Message from Anson FretAntonia B Ahern, MD sent at 11/09/2014  4:32 PM EST ----- Please let patient know his labs were within normal limits. HgbA1c was 5.8 which is slightly elevated, I am not concerned with it just ask him to watch his diet Thank you.

## 2014-11-09 NOTE — Telephone Encounter (Signed)
Tried calling patient on mobile. Voicemail box has not been set up and unable to leave message.

## 2014-11-28 ENCOUNTER — Encounter: Payer: Self-pay | Admitting: Neurology

## 2014-11-30 ENCOUNTER — Other Ambulatory Visit: Payer: Self-pay | Admitting: Neurology

## 2014-11-30 MED ORDER — CARBIDOPA-LEVODOPA 25-100 MG PO TABS: 1.0000 | ORAL_TABLET | Freq: Three times a day (TID) | ORAL | Status: DC

## 2014-12-07 ENCOUNTER — Telehealth: Payer: Self-pay | Admitting: Pulmonary Disease

## 2014-12-07 NOTE — Telephone Encounter (Signed)
Called pt and he needs PA for his foradil since he has tried and failed the covered alternatives Spiriva and Serevent . [facility] and was given # to call [phone] ID # [id number] Called and was advised this will go into clinical review and will receive a response in 48-72 Will forward to my box to f/u on  Pt aware

## 2014-12-08 ENCOUNTER — Telehealth: Payer: Self-pay | Admitting: Pulmonary Disease

## 2014-12-08 NOTE — Telephone Encounter (Signed)
Received approved for pt foradil through 10/02/15 [employer] [place] Made pt aware and nothing further needed

## 2014-12-08 NOTE — Telephone Encounter (Signed)
[phone], pt wife cb

## 2014-12-08 NOTE — Telephone Encounter (Signed)
Spoke with patient. States that Foradil appeal for coverage was approved but the cost is still too expensive. Pt states that the monthly cost of the Foradil is $228. Pt is requesting an alternative to try for coverage. Please advise Dr Shelle Ironlance. Thanks.

## 2014-12-08 NOTE — Telephone Encounter (Signed)
Please check on striverdi, 2 inhalations each am.

## 2014-12-09 NOTE — Telephone Encounter (Signed)
atc pt, na.  Wcb.

## 2014-12-09 NOTE — Telephone Encounter (Signed)
Attempted to call pt on both numbers listed. No answer, no option to leave a message. Will need to try back.

## 2014-12-10 NOTE — Telephone Encounter (Signed)
LM with wife to return call 

## 2014-12-10 NOTE — Telephone Encounter (Signed)
Spoke to patient, aware to contact insurance to see if Striverdi is covered. Pt will call back once he speaks with someone.

## 2015-01-22 ENCOUNTER — Other Ambulatory Visit: Payer: Self-pay

## 2015-01-22 MED ORDER — RIVASTIGMINE TARTRATE 1.5 MG PO CAPS: 1.5000 mg | ORAL_CAPSULE | Freq: Two times a day (BID) | ORAL | Status: DC

## 2015-03-04 ENCOUNTER — Ambulatory Visit (INDEPENDENT_AMBULATORY_CARE_PROVIDER_SITE_OTHER): Payer: Medicare Other | Admitting: Neurology

## 2015-03-04 ENCOUNTER — Encounter: Payer: Self-pay | Admitting: Neurology

## 2015-03-04 VITALS — BP 127/70 | HR 64 | Temp 97.5°F | Ht 65.0 in | Wt 158.0 lb

## 2015-03-04 DIAGNOSIS — G20 Parkinson's disease: Secondary | ICD-10-CM | POA: Diagnosis not present

## 2015-03-04 DIAGNOSIS — J8410 Pulmonary fibrosis, unspecified: Secondary | ICD-10-CM

## 2015-03-04 DIAGNOSIS — E538 Deficiency of other specified B group vitamins: Secondary | ICD-10-CM | POA: Diagnosis not present

## 2015-03-04 DIAGNOSIS — G2581 Restless legs syndrome: Secondary | ICD-10-CM | POA: Diagnosis not present

## 2015-03-04 DIAGNOSIS — G4750 Parasomnia, unspecified: Secondary | ICD-10-CM | POA: Insufficient documentation

## 2015-03-04 NOTE — Progress Notes (Signed)
[facility]    Provider:  Dr Lucia GaskinsAhern Referring Provider: Geoffry ParadiseAronson, Richard, MD Primary Care Physician:  Minda MeoARONSON,RICHARD A, MD  CC:  Sleep walking  HPI:  Gena FrayCharles E Germani is a 81 y.o. male here as a referral from Dr. Jacky KindleAronson for sleep walking, parkinson's disease and RLS, B12 deficiency  He continues to significant persistent episodes of sleep walking and confusion at night. Last night they went to bed at 10pm. He woke up last night at 1:30am. He was standing at the sink. He said he was helping people build a pond. He talks back to his wife but he does not remember these episodes. He urinated on the side of the bed. She got him back to bed and he got up again due to RLS, he doesn't remember all of it. He went to the refrigerator and she followed him down. He didn' t know where the fridge was. He remembers getting water but doesn't know why he was so confused. He tosses and tumbles, he doesn't usually remembr the episodes at night. He does around 2 or 4am not knowing which one was the hot or cold spicket. He can't find the door knob at night. He remembers some episodes but others he does not. The wife has gotten him out of the closet many times when he gets lost at night. He can't get out of the closet.   Thought possibly this was REM sleep disorder but clonazepam made it worse. He sas tried melatonin which also made things worse. The melatonin and clonazepam have made things worse. But these episodes started even before he tried medication. Unclear if this is a medication effect or not. He took [place] and destroyed the lamps one night.   Interval update 11/03/2014:  Patient is sleepwalking. He wakes up and moves. For example, he told his wife he was looking for a lantern. A lot of times he remembers sometimes he doesnt. Usually wife will ask a question and he can give her an answer. Sometimes there is a little agitation, may hit the bed. Agitation is rare, mostly confusional  wandering. Last night he was in the kitchen looking for water.   His feet are hurting. The bottoms are tingly. He has to stand up and walk around. He tries to sit down and read which helps some. The bottom of his right foot feels sore. It has been going on for 5 years.   Initial visit 08/03/2014: Gena FrayCharles E Abrigo is a 81 y.o. male here as a referral from Dr. Jacky KindleAronson for Parkinson's disease, RLS and memory changes. He is 81 years old and is a former patient of Dr. Hosie PoissonSumner and is transitioning to me. He was found to have B12 deficiency and started on B12 injections. At last appointment was starte don low-dose Sinemet and titrated to one pill three times daily.  Wife provides most history and updates. Things are gong very well. He is a "whole different person" when he is on the medicine. Can see a significant change and can tell when it wears off. They are taking the medications at 8:30am and is taking it 3x a day a whole pill. Tremor is improving and helps significantly. Still taking the injections. He has some sleeping problems. He takes Ropinerole and gabapentin at night. Usually he sleeps well when he gets to sleep. But he has some difficulty initiating sleep.   Currently taking Neurontin and Requip at bedtime for possible RLS. Is now currently taking Requip 1.5mg  and Gabapentin 300mg   nightly. Tried Gabapentin  and had hallucinations. Had tried higher dose of Requip but also suffered from hallucinations.   Has had a few TIAs in the past. No strokes. Otherwise healthy.   No family history of parkinson's or other neurodegenerative disorders   Basic lab work, including TSH, reviewed from PCP and was unremarkable.    Review of Systems: Patient complains of symptoms per HPI as well as the following symptoms no SOB, no CP. Pertinent negatives per HPI. All others negative.   History   Social History  . Marital Status: Married    Spouse Name: [name]  . Number of Children: 3  . Years of  Education: AA degree   Occupational History  . retired from [employer]    Social History Main Topics  . Smoking status: Never Smoker   . Smokeless tobacco: Never Used  . Alcohol Use: No  . Drug Use: No  . Sexual Activity: Not on file   Other Topics Concern  . Not on file   Social History Narrative   Patient is married to [name]), has 3 children    Patient is right handed   Education is AA degree   Caffeine consumption is 1 cup daily    Family History  Problem Relation Age of Onset  . Allergies Brother   . Heart disease Sister   . Stroke Sister   . Clotting disorder Sister   . Alzheimer's disease Father   . Dementia Father     Past Medical History  Diagnosis Date  . Osteoarthritis   . Hyperlipidemia   . GERD (gastroesophageal reflux disease)   . Restless legs syndrome (RLS)   . Interstitial lung disease   . Pleural thickening   . Hemorrhoids   . Cognitive decline   . Parkinson disease   . B12 deficiency   . Memory loss     Past Surgical History  Procedure Laterality Date  . Cataract extraction  03/2008  . Lung decortication  1962  . Colonoscopy    . Hemorrhoid surgery  06/21/2012    Procedure: HEMORRHOIDECTOMY;  Surgeon: Clovis Pu. Cornett, MD;  Location: [facility];  Service: General;  Laterality: N/A;  . Examination under anesthesia  08/27/2012    Procedure: EXAM UNDER ANESTHESIA;  Surgeon: Maisie Fus A. Cornett, MD;  Location: [facility];  Service: General;  Laterality: N/A;    Current Outpatient Prescriptions  Medication Sig Dispense Refill  . acetaminophen (TYLENOL) 325 MG tablet Take 650 mg by mouth as needed.      Marland Kitchen aspirin 81 MG tablet Take 81 mg by mouth daily.      . carbidopa-levodopa (SINEMET IR) 25-100 MG per tablet Take 1 tablet by mouth 3 (three) times daily. 90 tablet 11  . Docusate Sodium (COLACE PO) Take by mouth.    . ergocalciferol (VITAMIN D2) 50000 UNITS capsule Take 50,000 Units by mouth once a week.      .  fluticasone (FLONASE) 50 MCG/ACT nasal spray 2 sprays by Nasal route daily.      Marland Kitchen FORADIL AEROLIZER 12 MCG capsule for inhaler place 1 capsule INTO INHALER AND INHALE TWICE DAILY 60 capsule 11  . gabapentin (NEURONTIN) 300 MG capsule Take 300 mg by mouth daily.     Marland Kitchen guaiFENesin (MUCINEX) 600 MG 12 hr tablet Take 600 mg by mouth as needed.     . Polyethylene Glycol 3350 GRAN by Does not apply route.    Marland Kitchen RAPAFLO 8 MG CAPS capsule     .  rivastigmine (EXELON) 1.5 MG capsule Take 1 capsule (1.5 mg total) by mouth 2 (two) times daily. 60 capsule 3  . rOPINIRole (REQUIP) 1 MG tablet Take 1 mg by mouth at bedtime. Takes 1 1/2 tab QHS    . vitamin B-12 (CYANOCOBALAMIN) 1000 MCG tablet Take 1,000 mcg by mouth daily.     No current facility-administered medications for this visit.    Allergies as of 03/04/2015 - Review Complete 03/04/2015  Allergen Reaction Noted  . Advair diskus [fluticasone-salmeterol]  05/16/2012  . Aloe Swelling 07/22/2012  . Ambien [zolpidem tartrate] Other (See Comments) 05/01/2012  . Halcion [triazolam]  08/03/2014  . Horizant [gabapentin]  08/03/2014  . Lyrica [pregabalin] Other (See Comments) 05/01/2012  . Sulfonamide derivatives    . Ciprofloxacin Rash     Vitals: BP 127/70 mmHg  Pulse 64  Temp(Src) 97.5 F (36.4 C)  Ht  (1.651 m)  Wt 158 lb (71.668 kg)  BMI 26.29 kg/m2 Last Weight:  Wt Readings from Last 1 Encounters:  03/04/15 158 lb (71.668 kg)   Last Height:   Ht Readings from Last 1 Encounters:  03/04/15  (1.651 m)    Coordination: intermittent rest tremor noted in right hand (including with distraction), mild postural tremor L>RUE, mild bradykinesia with finger tapping bilateral, normal foot taps bilat.  Gait:  stands without assistance, upright, slight shuffling of steps, normal arm swing, negative Romberg, negative pull test  Tone:  Increased on the right with facilitation    Sensory exam: Decrease temperature and vibration  distally in the great toes )4 sec vibration). Intact pin prick and proprioception.    Assessment/Plan: 81y/o gentleman presenting for follow up parkinsonism, sleep disorder. He is having confusional wandering at night. Not sure if this  somnambulism vs REM sleep disorder? nocturnal seizures or medication effect?  Sinemet making him a "whole different person" working extremely well, improved shuffling and tremor.Clonazepam and melatonin made his nocturnal symptoms worse.  Will refer to sleep team for evaluation of possible sleep study if appropriate. Stop klonopin Try to stop all medications in the evening that can cause confusion For his B12 deficiency, continue oral supplementation and check B12 at next appt. Continue current Sinemet dosage.  Naomie Dean, MD  [facility] [address] [place], [place] [address]  Phone [phone] Fax [phone]  A total of 30 minutes was spent face-to-face with this patient. Over half this time was spent on counseling patient on the sleep disorder diagnosis and different diagnostic and therapeutic options available.

## 2015-03-04 NOTE — Patient Instructions (Signed)
Overall you are doing fairly well but I do want to suggest a few things today:   Remember to drink plenty of fluid, eat healthy meals and do not skip any meals. Try to eat protein with a every meal and eat a healthy snack such as fruit or nuts in between meals. Try to keep a regular sleep-wake schedule and try to exercise daily, particularly in the form of walking, 20-30 minutes a day, if you can.   As far as your medications are concerned, I would like to suggest: Stop klonopin  As far as diagnostic testing: Sleep Study  I would like to see you back in 3 months, sooner if we need to. Please call us with any interim questions, concerns, problems, updates or refill requests.   Please also call us for any test results so we can go over those with you on the phone.  My clinical assistant and will answer any of your questions and relay your messages to me and also relay most of my messages to you.   Our phone number is [phone]. We also have an after hours call service for urgent matters and there is a physician on-call for urgent questions. For any emergencies you know to call 911 or go to the nearest emergency room

## 2015-03-05 DIAGNOSIS — G20 Parkinson's disease: Secondary | ICD-10-CM | POA: Insufficient documentation

## 2015-03-05 DIAGNOSIS — E538 Deficiency of other specified B group vitamins: Secondary | ICD-10-CM | POA: Insufficient documentation

## 2015-03-05 DIAGNOSIS — G609 Hereditary and idiopathic neuropathy, unspecified: Secondary | ICD-10-CM | POA: Insufficient documentation

## 2015-03-05 DIAGNOSIS — G2581 Restless legs syndrome: Secondary | ICD-10-CM | POA: Insufficient documentation

## 2015-03-29 ENCOUNTER — Other Ambulatory Visit: Payer: Self-pay

## 2015-04-12 ENCOUNTER — Ambulatory Visit (INDEPENDENT_AMBULATORY_CARE_PROVIDER_SITE_OTHER): Payer: Medicare Other | Admitting: Neurology

## 2015-04-12 ENCOUNTER — Encounter: Payer: Self-pay | Admitting: Neurology

## 2015-04-12 VITALS — BP 126/70 | HR 68 | Resp 20 | Ht 65.75 in | Wt 156.0 lb

## 2015-04-12 DIAGNOSIS — F513 Sleepwalking [somnambulism]: Secondary | ICD-10-CM | POA: Diagnosis not present

## 2015-04-12 HISTORY — DX: Sleepwalking (somnambulism): F51.3

## 2015-04-12 NOTE — Progress Notes (Addendum)
[facility]    Provider:  Dr Lucia Gaskins Referring Provider: [name], MD Primary Care Physician:  Minda Meo, MD  CC:  Sleep walking  HPI:  Wesley Pierce is a 81 y.o. male here as a referral from Dr. Jacky Kindle for sleep walking, parkinson's disease and RLS, B12 deficiency.   Dr Lucia Gaskins at Initial visit 08/03/2014: Wesley Pierce is a 81 y.o. male here as a referral from Dr. Jacky Kindle for Parkinson's disease, RLS and memory changes. He is 81 years old and is a former patient of Dr. Hosie Poisson and is transitioning to me. He was found to have B12 deficiency and started on B12 injections. At last appointment was starte don low-dose Sinemet and titrated to one pill three times daily.  Wife provides most history and updates. Things are gong very well. He is a "whole different person" when he is on the medicine. Can see a significant change and can tell when it wears off. They are taking the medications at 8:30am and is taking it 3x a day a whole pill. Tremor is improving and helps significantly. Still taking the injections. He has some sleeping problems. He takes Ropinerole and gabapentin at night. Usually he sleeps well when he gets to sleep. But he has some difficulty initiating sleep.  Currently taking Neurontin and Requip at bedtime for possible RLS. Is now currently taking Requip 1.5mg  and Gabapentin 300mg  nightly. Tried Gabapentin 600mg  and had hallucinations. Had tried higher dose of Requip but also suffered from hallucinations. Has had a few TIAs in the past. No strokes. Otherwise healthy. No family history of parkinson's or other neurodegenerative disorders  Basic lab work, including TSH, reviewed from PCP and was unremarkable.   Dr. Vickey Huger, 04-12-15   He continues to significant persistent episodes of sleep walking and confusion at night. Last night they went to bed at 10pm. That he endorsed as usual bedtime. He has always had trouble to initiate sleep, since  childhood. He slept better on Neurontin and Requip. He is more drowsy in daytime now, too.  He woke up last night at 1:30am, usually at 2 or 4 AM . His wife reports no calling out, no diaphoresis and no dilated pupils. He is not afraid.  He has no longer episodes every night, but when under stress.  It comes in clusters. It is usually after 2-4 AM, more likely REM, but not classic REM BD. Mr. Rahmani reports that he usually watches with his wife TV before transferring to the bedroom and that there is no screen in the bedroom. He may repeat a little bit at night but usually there is no exciting activity or anything that would interrupt his ability to go to sleep. The bedroom is described as cool, quiet and dark - there tonight light so that he can find a way to the bathroom easier. He makes sure that he empties his bladder before going to bed. He does not drink caffeinated beverages in the afternoon and not before nighttime. He drinks some fluid throughout the afternoon and evening. He does also drinks apple juice besides water or a non-caffeine soda. He usually pretends to do something , an action/ project that he would have done in daytime. He has a special lock on the door. He no longer walks  into the shower trying to find his way out. This was frequently encountered while he was on Klonopin.    The patient is no longer on Klonopin or melatonin. Mrs. Brach advised me that  their mutual daughter is a sleep walker since childhood she is now mostly a sleep talker in adulthood and that 2 sons are using CPAP for the treatment of obstructive sleep apnea.  Patient  carries a diagnosis of a pulmonary inflammatory disease COPD, and has undergone a surgical procedure to remove a lining from the left lung. He did not have a history of childhood asthma or allergic respiratory diseases. He had no exercise induced asthma as far as he knows. He carries a diagnosis of interstitial lung disease.   Dr. Lucia Gaskins had  reported : "He was standing at the sink. He said he was helping people build a pond. He talks back to his wife but he does not remember these episodes. He urinated on the side of the bed. She got him back to bed and he got up again due to RLS, he doesn't remember all of it. He went to the refrigerator and she followed him down. He didn' t know where the fridge was. He remembers getting water but doesn't know why he was so confused. He tosses and tumbles, he doesn't usually remembr the episodes at night. He does around 2 or 4am not knowing which one was the hot or cold spicket. He can't find the door knob at night. He remembers some episodes but others he does not. The wife has gotten him out of the closet many times when he gets lost at night. He can't get out of the closet."  Thought possibly this was REM sleep disorder but clonazepam made it worse. He sas tried melatonin which also made things worse. The melatonin and clonazepam have made things worse. But these episodes started even before he tried medication. Unclear if this is a medication effect or not. He took [place] and destroyed the lamps one night.  Interval update 11/03/2014 Patient is sleepwalking. He wakes up and moves. For example, he told his wife he was looking for a lantern. A lot of times he remembers sometimes he doesnt. Usually wife will ask a question and he can give her an answer. Sometimes there is a little agitation, may hit the bed. Agitation is rare, mostly confusional wandering. Last night he was in the kitchen looking for water.  His feet are hurting. The bottoms are tingly. He has to stand up and walk around. He tries to sit down and read which helps some. The bottom of his right foot feels sore. It has been going on for 5 years.     Review of Systems: Patient complains of symptoms per HPI as well as the following symptoms no SOB, no CP. Pertinent negatives per HPI. All others negative. 04-12-15 the patient endorsed the Epworth  sleepiness score at 5 points and the fatigue severity score at 42 points the geriatric depression score at 3 points. Spouse endorsed epworth at 10.   History   Social History  . Marital Status: Married    Spouse Name: [name]  . Number of Children: 3  . Years of Education: AA degree   Occupational History  . retired from [employer]    Social History Main Topics  . Smoking status: Never Smoker   . Smokeless tobacco: Never Used  . Alcohol Use: No  . Drug Use: No  . Sexual Activity: Not on file   Other Topics Concern  . Not on file   Social History Narrative   Patient is married to [name]), has 3 children    Patient is right handed   Education is  AA degree   Caffeine consumption is 1 cup daily    Family History  Problem Relation Age of Onset  . Allergies Brother   . Heart disease Sister   . Stroke Sister   . Clotting disorder Sister   . Alzheimer's disease Father   . Dementia Father     Past Medical History  Diagnosis Date  . Osteoarthritis   . Hyperlipidemia   . GERD (gastroesophageal reflux disease)   . Restless legs syndrome (RLS)   . Interstitial lung disease   . Pleural thickening   . Hemorrhoids   . Cognitive decline   . Parkinson disease   . B12 deficiency   . Memory loss     Past Surgical History  Procedure Laterality Date  . Cataract extraction  03/2008  . Lung decortication  1962  . Colonoscopy    . Hemorrhoid surgery  06/21/2012    Procedure: HEMORRHOIDECTOMY;  Surgeon: Clovis Pu. Cornett, MD;  Location: [facility];  Service: General;  Laterality: N/A;  . Examination under anesthesia  08/27/2012    Procedure: EXAM UNDER ANESTHESIA;  Surgeon: Maisie Fus A. Cornett, MD;  Location: [facility];  Service: General;  Laterality: N/A;    Current Outpatient Prescriptions  Medication Sig Dispense Refill  . acetaminophen (TYLENOL) 325 MG tablet Take 650 mg by mouth as needed.      Marland Kitchen aspirin 81 MG tablet Take 81 mg by mouth daily.      .  carbidopa-levodopa (SINEMET IR) 25-100 MG per tablet Take 1 tablet by mouth 3 (three) times daily. 90 tablet 11  . Docusate Sodium (COLACE PO) Take by mouth.    . ergocalciferol (VITAMIN D2) 50000 UNITS capsule Take 50,000 Units by mouth once a week.      . fluticasone (FLONASE) 50 MCG/ACT nasal spray 2 sprays by Nasal route daily.      Marland Kitchen FORADIL AEROLIZER 12 MCG capsule for inhaler place 1 capsule INTO INHALER AND INHALE TWICE DAILY 60 capsule 11  . gabapentin (NEURONTIN) 300 MG capsule Take 300 mg by mouth daily.     Marland Kitchen guaiFENesin (MUCINEX) 600 MG 12 hr tablet Take 600 mg by mouth as needed.     . Polyethylene Glycol 3350 GRAN by Does not apply route.    Marland Kitchen RAPAFLO 8 MG CAPS capsule     . rivastigmine (EXELON) 1.5 MG capsule Take 1 capsule (1.5 mg total) by mouth 2 (two) times daily. 60 capsule 3  . rOPINIRole (REQUIP) 1 MG tablet Take 1 mg by mouth at bedtime. Takes 1 1/2 tab QHS    . vitamin B-12 (CYANOCOBALAMIN) 1000 MCG tablet Take 1,000 mcg by mouth daily.     No current facility-administered medications for this visit.    Allergies as of 04/12/2015 - Review Complete 04/12/2015  Allergen Reaction Noted  . Advair diskus [fluticasone-salmeterol]  05/16/2012  . Aloe Swelling 07/22/2012  . Ambien [zolpidem tartrate] Other (See Comments) 05/01/2012  . Halcion [triazolam]  08/03/2014  . Horizant [gabapentin]  08/03/2014  . Lyrica [pregabalin] Other (See Comments) 05/01/2012  . Sulfonamide derivatives    . Ciprofloxacin Rash     Vitals: BP 126/70 mmHg  Pulse 68  Resp 20  Ht 5' 5.75" (1.67 m)  Wt 156 lb (70.761 kg)  BMI 25.37 kg/m2 Last Weight:  Wt Readings from Last 1 Encounters:  04/12/15 156 lb (70.761 kg)   Last Height:   Ht Readings from Last 1 Encounters:  04/12/15 5' 5.75" (1.67 m)  General: The patient is awake, alert and appears not in acute distress. The patient is well groomed. Head: Normocephalic, atraumatic. Neck is supple. Mallampati 3 , neck circumference:  16. 00 Cardiovascular:  Regular rate and rhythm , without  murmurs or carotid bruit, and without distended neck veins. Respiratory: Lungs are clear to auscultation. Skin:  Without evidence of edema, or rash Trunk: BMI is not  elevated and patient  has normal posture.   Neurologic exam : The patient is awake and alert, oriented to place and time.  Memory subjective not described as intact. There is a normal attention span & concentration ability. Speech is fluent without  dysarthria, dysphonia or aphasia. Mood and affect are appropriate.  Cranial nerves: Pupils are equal and briskly reactive to light. Funduscopic exam without  evidence of pallor or edema. Extraocular movements  in vertical and horizontal planes intact and without nystagmus. Visual fields by finger perimetry are intact. Hearing to finger rub intact.  Facial sensation intact to fine touch. Facial motor strength is symmetric and tongue and uvula move midline.  Motor exam: Normal tone and normal muscle bulk and symmetric normal strength in all extremities. No evidence of cog-wheeling   Sensory:  Fine touch, pinprick and vibration were tested in all extremities. Proprioception is tested in the upper extremities only and normal .  This was normal.  Coordination: Rapid alternating movements in the fingers/hands is tested and normal. Finger-to-nose maneuver tested and normal without evidence of ataxia, dysmetria or tremor.  Gait and station: Patient walks without assistive device and is able and assisted stool climb up to the exam table. Strength within normal limits. Stance is stable and normal. Tandem gait is intact, the patient is able to turn with 3 steps 180. He can walk with everted and inverted toes. His right arm does not have any arm swing while he walks.  Steps are unfragmented. Romberg testing is normal.  Deep tendon reflexes: in the upper and lower extremities are symmetric and intact. Babinski maneuver response is  downgoing.   Assessment:  After physical and neurologic examination, review of laboratory studies, imaging, neurophysiology testing and pre-existing records, assessment /problem list.  Plan:  Treatment plan and additional workup ;  I would like to see Mr. Boone MasterHenning for a sleep study. We will perform this study with an expanded EEG montage to see exactly and which sleep stage he may have a sleepwalking episode. In addition I would like him to bring his nocturnal medication to the sleep lab but not take it at home but here in the sleep lab  under supervision- we may chose not to take the medications to capture. If sleep apnea should be found febrile split at 15 AHI with a score 4% desaturation is usual for Medicare patients.  Melvyn Novasarmen Duc Crocket, MD   [facility] [address] [place], [place] [address]  Phone [phone] Fax [phone]  A total of 40 minutes was spent face-to-face with this patient. Over half this time was spent on counseling patient on the sleep disorder diagnosis and different diagnostic and therapeutic options available.

## 2015-05-06 ENCOUNTER — Ambulatory Visit (INDEPENDENT_AMBULATORY_CARE_PROVIDER_SITE_OTHER): Payer: Medicare Other | Admitting: Neurology

## 2015-05-06 DIAGNOSIS — F513 Sleepwalking [somnambulism]: Secondary | ICD-10-CM | POA: Diagnosis not present

## 2015-05-07 NOTE — Sleep Study (Signed)
Please see the scanned sleep study interpretation located in the Procedure tab within the Chart Review section. 

## 2015-05-11 ENCOUNTER — Encounter: Payer: Self-pay | Admitting: Emergency Medicine

## 2015-05-11 ENCOUNTER — Ambulatory Visit (INDEPENDENT_AMBULATORY_CARE_PROVIDER_SITE_OTHER): Payer: Medicare Other | Admitting: Emergency Medicine

## 2015-05-11 ENCOUNTER — Ambulatory Visit: Payer: Medicare Other | Admitting: Pulmonary Disease

## 2015-05-11 VITALS — BP 124/70 | HR 63 | Ht 65.5 in | Wt 156.0 lb

## 2015-05-11 DIAGNOSIS — G2581 Restless legs syndrome: Secondary | ICD-10-CM | POA: Diagnosis not present

## 2015-05-11 DIAGNOSIS — J439 Emphysema, unspecified: Secondary | ICD-10-CM | POA: Diagnosis not present

## 2015-05-11 NOTE — Assessment & Plan Note (Signed)
Sleep study has been performed and recommendations to be made regarding this and also his sleepwalking by Dr Theresia Bough.

## 2015-05-11 NOTE — Assessment & Plan Note (Signed)
We discussed the potential change from Foradil to an alternative, possibly a 24-hour beta agonist. Given the other issues that are currently being evaluated including his sleep disturbance, restless legs, sleep walking, he and his wife would like to stay on the same Foradil for now and reconsider in the future.

## 2015-05-11 NOTE — Patient Instructions (Addendum)
Please continue Foradil for now. You may want to consider increasing this to twice a day to see if it benefits breathing. We will reconsider a possible alternative to this medicine in the future given its cost and insurance coverage.  Continue your Requip at same dose for now. This and your other medications may be adjusted depending on the results of your sleep study Follow with Dr Delton Coombes in 6 months or sooner if you have any problems

## 2015-05-11 NOTE — Progress Notes (Signed)
Subjective:    Patient ID: Wesley Pierce, male    DOB: 05-25-1931, 81 y.o.   MRN: [id number]  HPI 81 year old gentleman who follows up for COPD, some chronic right basilar scar and restless leg syndrome. Also with a hx of a remote L decortication, ? Due to empyema. He has been followed by Dr. Shelle Iron. He also sees neurology for vitamin B-12 deficiency and early parkinson's. He is currently managed on Foradil, only taking it qd right now. He forgets it, also the insurance cost has been problematic. His restless leg syndrome is treated with Requip 1.5 mg daily at bedtime.  He describes stable breathing, good exercise tolerance. He is able to sing in chorus.  His restless legs are active - he just had a repeat PSG last week. Also Sleep walking is a concern, ? Related to degree of his medications, gabapentin, etc. History today is taken from the patient and from his wife.    Review of Systems As per HPI  Past Medical History  Diagnosis Date  . Osteoarthritis   . Hyperlipidemia   . GERD (gastroesophageal reflux disease)   . Restless legs syndrome (RLS)   . Interstitial lung disease   . Pleural thickening   . Hemorrhoids   . Cognitive decline   . Parkinson disease   . B12 deficiency   . Memory loss   . Non-rapid eye movement sleep arousal disorder, sleep walking type 04/12/2015     Family History  Problem Relation Age of Onset  . Allergies Brother   . Heart disease Sister   . Stroke Sister   . Clotting disorder Sister   . Alzheimer's disease Father   . Dementia Father      History   Social History  . Marital Status: Married    Spouse Name: [name]  . Number of Children: 3  . Years of Education: AA degree   Occupational History  . retired from [employer]    Social History Main Topics  . Smoking status: Never Smoker   . Smokeless tobacco: Never Used  . Alcohol Use: No  . Drug Use: No  . Sexual Activity: Not on file   Other Topics Concern  . Not on file   Social History  Narrative   Patient is married to [name]), has 3 children    Patient is right handed   Education is AA degree   Caffeine consumption is 1 cup daily     Allergies  Allergen Reactions  . Advair Diskus [Fluticasone-Salmeterol]     anxiety  . Aloe Swelling  . Ambien [Zolpidem Tartrate] Other (See Comments)    Sleep walking  . Halcion [Triazolam]     Sleep Walking  . Horizant [Gabapentin]     Sleep walking and halucinations  . Lyrica [Pregabalin] Other (See Comments)    Sleep walking   . Sulfonamide Derivatives     Pt unable to remember  . Ciprofloxacin Rash     Outpatient Prescriptions Prior to Visit  Medication Sig Dispense Refill  . acetaminophen (TYLENOL) 325 MG tablet Take 650 mg by mouth as needed.      Marland Kitchen aspirin 81 MG tablet Take 81 mg by mouth daily.      . carbidopa-levodopa (SINEMET IR) 25-100 MG per tablet Take 1 tablet by mouth 3 (three) times daily. 90 tablet 11  . ergocalciferol (VITAMIN D2) 50000 UNITS capsule Take 50,000 Units by mouth once a week.      . fluticasone (FLONASE) 50 MCG/ACT nasal  spray 2 sprays by Nasal route daily.      Marland Kitchen FORADIL AEROLIZER 12 MCG capsule for inhaler place 1 capsule INTO INHALER AND INHALE TWICE DAILY 60 capsule 11  . gabapentin (NEURONTIN) 300 MG capsule Take 300 mg by mouth daily.     Marland Kitchen guaiFENesin (MUCINEX) 600 MG 12 hr tablet Take 600 mg by mouth as needed.     . Polyethylene Glycol 3350 GRAN by Does not apply route.    Marland Kitchen RAPAFLO 8 MG CAPS capsule     . rivastigmine (EXELON) 1.5 MG capsule Take 1 capsule (1.5 mg total) by mouth 2 (two) times daily. 60 capsule 3  . rOPINIRole (REQUIP) 1 MG tablet Take 1 mg by mouth at bedtime. Takes 1 1/2 tab QHS    . vitamin B-12 (CYANOCOBALAMIN) 1000 MCG tablet Take 1,000 mcg by mouth daily.    Tery Sanfilippo Sodium (COLACE PO) Take by mouth.     No facility-administered medications prior to visit.         Objective:   Physical Exam Filed Vitals:   05/11/15 1034  BP: 124/70  Pulse: 63    Height: 5' 5.5" (1.664 m)  Weight: 156 lb (70.761 kg)  SpO2: 97%   Gen: Pleasant, well-nourished, in no distress,  normal affect  ENT: No lesions,  mouth clear,  oropharynx clear, no postnasal drip  Neck: No JVD, no TMG, no carotid bruits  Lungs: No use of accessory muscles, clear on the left, inspiratory right basilar crackles, no wheezes  Cardiovascular: RRR, heart sounds normal, no murmur or gallops, no peripheral edema  Musculoskeletal: No deformities, no cyanosis or clubbing  Neuro: alert, non focal, some forgetfulness and difficulty with short-term memory  Skin: Warm, no lesions or rashes      Assessment & Plan:  COPD (chronic obstructive pulmonary disease) We discussed the potential change from Foradil to an alternative, possibly a 24-hour beta agonist. Given the other issues that are currently being evaluated including his sleep disturbance, restless legs, sleep walking, he and his wife would like to stay on the same Foradil for now and reconsider in the future.   RLS (restless legs syndrome) Sleep study has been performed and recommendations to be made regarding this and also his sleepwalking by Dr Theresia Bough.

## 2015-05-17 ENCOUNTER — Telehealth: Payer: Self-pay

## 2015-05-17 NOTE — Telephone Encounter (Signed)
Spoke to pt regarding sleep study results. Advised pt that his sleep study revealed mild OSA. I also advised that in his periods of wakefulness, he was talking to himself and pulling on tubes and wires, which is inconsistent with REM BD. I advised them that Dr. Vickey Huger recommends trying an autocpap trial for 14-21 days. Pt said he would like an appt before he decides whether to do that or not. He sees Dr. Lucia Gaskins on 8/22, he wanted to keep that appt, and make a separate one with Dr. Vickey Huger. Appt made for 9/8 at 2:00.

## 2015-05-24 ENCOUNTER — Encounter: Payer: Self-pay | Admitting: Neurology

## 2015-05-24 ENCOUNTER — Ambulatory Visit (INDEPENDENT_AMBULATORY_CARE_PROVIDER_SITE_OTHER): Payer: Medicare Other | Admitting: Neurology

## 2015-05-24 VITALS — BP 116/64 | HR 69 | Ht 65.5 in | Wt 157.8 lb

## 2015-05-24 DIAGNOSIS — R410 Disorientation, unspecified: Secondary | ICD-10-CM

## 2015-05-24 DIAGNOSIS — F513 Sleepwalking [somnambulism]: Secondary | ICD-10-CM

## 2015-05-24 DIAGNOSIS — G20 Parkinson's disease: Secondary | ICD-10-CM | POA: Diagnosis not present

## 2015-05-24 DIAGNOSIS — F05 Delirium due to known physiological condition: Secondary | ICD-10-CM | POA: Diagnosis not present

## 2015-05-24 NOTE — Progress Notes (Signed)
[facility]    Provider:  Dr Lucia Gaskins Referring Provider: [name], MD Primary Care Physician:  Minda Meo, MD  CC:  Sleep walking  Interval history 05/24/2015 :  Wesley Pierce is a 81 y.o. male here as a follow up. He has Parkinson's with good response to Sinemet. He continues to sleep walk and recently had a sleep study. He is here to discuss his sleep study. However he does has a follow up with Dr. Vickey Huger on the 1st of September. I have explained to him that following up with Dr. Vickey Huger for sleep is appropriate but I can give him an initial review. He continues to sleep walk. The other night he urinated on his couch in the middle of the night. Results showed that he was talking to himself and pulling out his lines during wakefullness, there was no REM sleep to evaluate for REM sleep disorder, EEG montage did not show seizures. Sleep study recommendation was to decrease any medications that could cause confusion, he only takes neurontin at night. Can trial stopping that but doubtful this is the cause. May consider an ambulatory eeg.   Interval history 03/04/2015: He continues to significant persistent episodes of sleep walking and confusion at night. Last night they went to bed at 10pm. He woke up last night at 1:30am. He was standing at the sink. He said he was helping people build a pond. He talks back to his wife but he does not remember these episodes. He urinated on the side of the bed. She got him back to bed and he got up again due to RLS, he doesn't remember all of it. He went to the refrigerator and she followed him down. He didn' t know where the fridge was. He remembers getting water but doesn't know why he was so confused. He tosses and tumbles, he doesn't usually remembr the episodes at night. He does around 2 or 4am not knowing which one was the hot or cold spicket. He can't find the door knob at night. He remembers some episodes but others he does  not. The wife has gotten him out of the closet many times when he gets lost at night. He can't get out of the closet.   Thought possibly this was REM sleep disorder but clonazepam made it worse. He sas tried melatonin which also made things worse. The melatonin and clonazepam have made things worse. But these episodes started even before he tried medication. Unclear if this is a medication effect or not. He took [place] and destroyed the lamps one night.   Interval update 11/03/2014:  Patient is sleepwalking. He wakes up and moves. For example, he told his wife he was looking for a lantern. A lot of times he remembers sometimes he doesnt. Usually wife will ask a question and he can give her an answer. Sometimes there is a little agitation, may hit the bed. Agitation is rare, mostly confusional wandering. Last night he was in the kitchen looking for water.   His feet are hurting. The bottoms are tingly. He has to stand up and walk around. He tries to sit down and read which helps some. The bottom of his right foot feels sore. It has been going on for 5 years.   Initial visit 08/03/2014: Wesley Pierce is a 81 y.o. male here as a referral from Dr. Jacky Kindle for Parkinson's disease, RLS and memory changes. He is 81 years old and is a former patient of Dr. Hosie Poisson  and is transitioning to me. He was found to have B12 deficiency and started on B12 injections. At last appointment was starte don low-dose Sinemet and titrated to one pill three times daily.  Wife provides most history and updates. Things are gong very well. He is a "whole different person" when he is on the medicine. Can see a significant change and can tell when it wears off. They are taking the medications at 8:30am and is taking it 3x a day a whole pill. Tremor is improving and helps significantly. Still taking the injections. He has some sleeping problems. He takes Ropinerole and gabapentin at night. Usually he sleeps well when he gets to sleep.  But he has some difficulty initiating sleep.   Currently taking Neurontin and Requip at bedtime for possible RLS. Is now currently taking Requip 1.5mg  and Gabapentin  nightly. Tried Gabapentin  and had hallucinations. Had tried higher dose of Requip but also suffered from hallucinations.   Has had a few TIAs in the past. No strokes. Otherwise healthy.   No family history of parkinson's or other neurodegenerative disorders   Basic lab work, including TSH, reviewed from PCP and was unremarkable.  Review of Systems: Patient complains of symptoms per HPI as well as the following symptoms: no CP, no SOB. Pertinent negatives per HPI. All others negative.   Social History   Social History  . Marital Status: Married    Spouse Name: [name]  . Number of Children: 3  . Years of Education: AA degree   Occupational History  . retired from [employer]    Social History Main Topics  . Smoking status: Never Smoker   . Smokeless tobacco: Never Used  . Alcohol Use: No  . Drug Use: No  . Sexual Activity: Not on file   Other Topics Concern  . Not on file   Social History Narrative   Patient is married to [name]), has 3 children    Patient is right handed   Education is AA degree   Caffeine consumption is 1 cup daily    Family History  Problem Relation Age of Onset  . Allergies Brother   . Heart disease Sister   . Stroke Sister   . Clotting disorder Sister   . Alzheimer's disease Father   . Dementia Father     Past Medical History  Diagnosis Date  . Osteoarthritis   . Hyperlipidemia   . GERD (gastroesophageal reflux disease)   . Restless legs syndrome (RLS)   . Interstitial lung disease   . Pleural thickening   . Hemorrhoids   . Cognitive decline   . Parkinson disease   . B12 deficiency   . Memory loss   . Non-rapid eye movement sleep arousal disorder, sleep walking type 04/12/2015    Past Surgical History  Procedure Laterality Date  . Cataract extraction  03/2008  .  Lung decortication  1962  . Colonoscopy    . Hemorrhoid surgery  06/21/2012    Procedure: HEMORRHOIDECTOMY;  Surgeon: Clovis Pu. Cornett, MD;  Location: [facility];  Service: General;  Laterality: N/A;  . Examination under anesthesia  08/27/2012    Procedure: EXAM UNDER ANESTHESIA;  Surgeon: Maisie Fus A. Cornett, MD;  Location: [facility];  Service: General;  Laterality: N/A;    Current Outpatient Prescriptions  Medication Sig Dispense Refill  . acetaminophen (TYLENOL) 325 MG tablet Take 650 mg by mouth as needed.      Marland Kitchen aspirin 81 MG tablet Take  81 mg by mouth daily.      . carbidopa-levodopa (SINEMET IR) 25-100 MG per tablet Take 1 tablet by mouth 3 (three) times daily. 90 tablet 11  . ergocalciferol (VITAMIN D2) 50000 UNITS capsule Take 50,000 Units by mouth once a week.      . fluticasone (FLONASE) 50 MCG/ACT nasal spray 2 sprays by Nasal route daily.      Marland Kitchen FORADIL AEROLIZER 12 MCG capsule for inhaler place 1 capsule INTO INHALER AND INHALE TWICE DAILY 60 capsule 11  . gabapentin (NEURONTIN) 600 MG tablet Take 600 mg by mouth daily. Cuts pill in half per pt.    . guaiFENesin (MUCINEX) 600 MG 12 hr tablet Take 600 mg by mouth as needed.     . Polyethylene Glycol 3350 GRAN by Does not apply route.    Marland Kitchen RAPAFLO 8 MG CAPS capsule     . rivastigmine (EXELON) 1.5 MG capsule Take 1 capsule (1.5 mg total) by mouth 2 (two) times daily. 60 capsule 3  . rOPINIRole (REQUIP) 1 MG tablet Take 1 mg by mouth at bedtime. Takes 1 1/2 tab QHS    . vitamin B-12 (CYANOCOBALAMIN) 1000 MCG tablet Take 1,000 mcg by mouth daily.    Marland Kitchen gabapentin (NEURONTIN) 300 MG capsule Take 300 mg by mouth daily.      No current facility-administered medications for this visit.    Allergies as of 05/24/2015 - Review Complete 05/24/2015  Allergen Reaction Noted  . Advair diskus [fluticasone-salmeterol]  05/16/2012  . Aloe Swelling 07/22/2012  . Ambien [zolpidem tartrate] Other (See Comments)  05/01/2012  . Halcion [triazolam]  08/03/2014  . Horizant [gabapentin]  08/03/2014  . Lyrica [pregabalin] Other (See Comments) 05/01/2012  . Sulfonamide derivatives    . Ciprofloxacin Rash     Vitals: BP 116/64 mmHg  Pulse 69  Ht 5' 5.5" (1.664 m)  Wt 157 lb 12.8 oz (71.578 kg)  BMI 25.85 kg/m2 Last Weight:  Wt Readings from Last 1 Encounters:  05/24/15 157 lb 12.8 oz (71.578 kg)   Last Height:   Ht Readings from Last 1 Encounters:  05/24/15 5' 5.5" (1.664 m)     Coordination: intermittent rest tremor noted in right hand (including with distraction), mild postural tremor L>RUE, mild bradykinesia with finger tapping bilateral, normal foot taps bilat.  Gait:  stands without assistance, upright, slight shuffling of steps, normal arm swing, negative Romberg, negative pull test  Tone:  Increased on the right with facilitation   Sensory exam: Decrease temperature and vibration distally in the great toes )4 sec vibration). Intact pin prick and proprioception.    Assessment/Plan: 81y/o gentleman presenting for follow up parkinsonism, sleep disorder. He is having confusional wandering at night. Not sure if this somnambulism vs REM sleep disorder? nocturnal seizures or medication effect? Sinemet making him a "whole different person" working extremely well, improved shuffling and tremor.Clonazepam and melatonin made his nocturnal symptoms worse.  Sleep study with mild OSA, no REM sleep so could not eval for this, he has follow up with Dr. Vickey Huger, see HPI for details on discussion with patient Stop klonopin and neurontin and see if this helps nocturnal confusion. May order ambulatory eeg.  Try to stop all medications in the evening that can cause confusion For his B12 deficiency, continue oral supplementation and check B12 at next appt. Continue current Sinemet dosage.  Naomie Dean, MD  [facility] [address] [place], [place]  [address]  Phone [phone] Fax [phone]  A total  of 30 minutes was spent face-to-face with this patient. Over half this time was spent on counseling patient on the sleep disorder diagnosis and different diagnostic and therapeutic options available.

## 2015-05-24 NOTE — Patient Instructions (Signed)
Remember to drink plenty of fluid, eat healthy meals and do not skip any meals. Try to eat protein with a every meal and eat a healthy snack such as fruit or nuts in between meals. Try to keep a regular sleep-wake schedule and try to exercise daily, particularly in the form of walking, 20-30 minutes a day, if you can.   As far as diagnostic testing: Will consider overnight EEG  I would like to see you back September first at 2:45pm with Dr. Vickey Huger, sooner if we need to. Please call us with any interim questions, concerns, problems, updates or refill requests.   Our phone number is [phone]. We also have an after hours call service for urgent matters and there is a physician on-call for urgent questions. For any emergencies you know to call 911 or go to the nearest emergency room

## 2015-05-27 ENCOUNTER — Other Ambulatory Visit: Payer: Self-pay | Admitting: Neurology

## 2015-05-27 ENCOUNTER — Telehealth: Payer: Self-pay | Admitting: Neurology

## 2015-05-27 NOTE — Telephone Encounter (Signed)
Spoke with wife. Patient is having episodes of confusion esepcailly at night. Will order ambulatory eeg.  Kara Mead - would you fill out a form for Wesley Pierce for ambulatory EEG and I will complete it Friday. If you don't have the forms contact me and I can tell you where to find them thanks

## 2015-05-27 NOTE — Telephone Encounter (Signed)
Thank you :)

## 2015-05-27 NOTE — Telephone Encounter (Signed)
Originally prescribed 11/02

## 2015-05-31 ENCOUNTER — Encounter: Payer: Self-pay | Admitting: *Deleted

## 2015-05-31 NOTE — Progress Notes (Signed)
Faxed signed neurodiagnostics video EEG certificate of necessity to [facility] at [id number]. Received fax confirmation. Send copy to medical records.

## 2015-06-03 ENCOUNTER — Ambulatory Visit (INDEPENDENT_AMBULATORY_CARE_PROVIDER_SITE_OTHER): Payer: Medicare Other | Admitting: Neurology

## 2015-06-03 ENCOUNTER — Encounter: Payer: Self-pay | Admitting: Neurology

## 2015-06-03 VITALS — BP 116/62 | HR 72 | Resp 20 | Ht 66.0 in | Wt 158.0 lb

## 2015-06-03 DIAGNOSIS — G20 Parkinson's disease: Secondary | ICD-10-CM

## 2015-06-03 DIAGNOSIS — G3184 Mild cognitive impairment, so stated: Secondary | ICD-10-CM

## 2015-06-03 DIAGNOSIS — G4751 Confusional arousals: Secondary | ICD-10-CM | POA: Diagnosis not present

## 2015-06-03 DIAGNOSIS — R441 Visual hallucinations: Secondary | ICD-10-CM

## 2015-06-03 MED ORDER — PIMAVANSERIN TARTRATE 17 MG PO TABS: 17.0000 mg | ORAL_TABLET | Freq: Every morning | ORAL | Status: DC

## 2015-06-03 NOTE — Progress Notes (Signed)
Faxed the nuplazid start form and RX to [facility], [employer].

## 2015-06-03 NOTE — Patient Instructions (Signed)
Takes the Rapaflo medication in the morning to avoid bathroom breaks at night. Reduce temporarily with ropinirole from 1-1/2 tablets to 1 tablet and see if it has a major impact on restless legs. If not I would be happy to for you to wean off the restless leg medication.You will  have to stay on your carbidopa levodopa also known as Sinemet. Parkinson medications can induce visual hallucinations also called visual psychosis. In the treatment of this condition nuplazid is used.

## 2015-06-03 NOTE — Progress Notes (Signed)
[facility]    Provider:  Dr Lucia Gaskins Referring Provider: [name], MD Primary Care Physician:  Minda Meo, MD  CC:  Sleep walking  HPI:  Wesley Pierce is a 81 y.o. male here as a referral from Dr. Jacky Kindle for sleep walking, parkinson's disease and RLS, B12 deficiency.  08/03/2014: Wesley Pierce is a 81 y.o. male here as a referral from Dr. Jacky Kindle for Parkinson's disease, RLS and memory changes. He is 81 years old and is a former patient of Dr. Hosie Poisson and is transitioning to me. He was found to have B12 deficiency and started on B12 injections. At last appointment was starte don low-dose Sinemet and titrated to one pill three times daily. Wife provides most history and updates. Things are gong very well. He is a "whole different person" when he is on the medicine. Can see a significant change and can tell when it wears off. They are taking the medications at 8:30am and is taking it 3x a day a whole pill. Tremor is improving and helps significantly. Still taking the injections. He has some sleeping problems. He takes Ropinerole and gabapentin at night. Usually he sleeps well when he gets to sleep. But he has some difficulty initiating sleep.  Currently taking Neurontin and Requip at bedtime for possible RLS. Is now currently taking Requip 1.5mg  and Gabapentin  nightly. Tried Gabapentin  and had hallucinations. Had tried higher dose of Requip but also suffered from hallucinations. Has had a few TIAs in the past. No strokes. Otherwise healthy. No family history of parkinson's or other neurodegenerative disorders  Basic lab work, including TSH, reviewed from PCP and was unremarkable.   Dr. Vickey Huger, 04-12-15  He continues to significant persistent episodes of sleep walking and confusion at night. Last night they went to bed at 10pm. That he endorsed as usual bedtime. He has always had trouble to initiate sleep, since childhood. He slept better on  Neurontin and Requip. He is more drowsy in daytime now, too.  He woke up last night at 1:30am, usually at 2 or 4 AM . His wife reports no calling out, no diaphoresis and no dilated pupils. He is not afraid.  He has no longer episodes every night, but when under stress.  It comes in clusters. It is usually after 2-4 AM, more likely REM, but not classic REM BD. Wesley Pierce reports that he usually watches with his wife TV before transferring to the bedroom and that there is no screen in the bedroom. He may repeat a little bit at night but usually there is no exciting activity or anything that would interrupt his ability to go to sleep. The bedroom is described as cool, quiet and dark - there tonight light so that he can find a way to the bathroom easier. He makes sure that he empties his bladder before going to bed. He does not drink caffeinated beverages in the afternoon and not before nighttime. He drinks some fluid throughout the afternoon and evening. He does also drinks apple juice besides water or a non-caffeine soda. He usually pretends to do something , an action/ project that he would have done in daytime. He has a special lock on the door. He no longer walks  into the shower trying to find his way out. This was frequently encountered while he was on Klonopin.  The patient is no longer on Klonopin or melatonin.  His family history is interesting for sleep disorders; Mrs. Yarbro advised me that their  mutual daughter is a sleep walker since childhood. she is now mostly a sleep talker in adulthood and that 2 sons are using CPAP for the treatment of obstructive sleep apnea.  Patient  carries a diagnosis of  COPD, and has undergone a surgical procedure to remove the  lining from the left lung. He carries a diagnosis of interstitial lung disease. Dr. Lucia Gaskins had reported : "He was standing at the sink. He said he was helping people build a pond. He talks back to his wife but he does not remember these  episodes. He urinated on the side of the bed. She got him back to bed and he got up again due to RLS, he doesn't remember all of it. He went to the refrigerator and she followed him down. He didn' t know where the fridge was. He remembers getting water but doesn't know why he was so confused. He tosses and tumbles, he doesn't usually remembr the episodes at night. He does around 2 or 4am not knowing which one was the hot or cold spicket. He can't find the door knob at night. He remembers some episodes but others he does not. The wife has gotten him out of the closet many times when he gets lost at night. He can't get out of the closet."  Thought possibly this was REM sleep disorder but clonazepam made it worse. He sas tried melatonin which also made things worse. The melatonin and clonazepam have made things worse. He took [place] and destroyed the lamps one night.   Interval history from 06-03-15. Wesley Pierce underwent a sleep study at [facility] sleep at [facility]. He was diagnosed with very mild sleep apnea the AHI was 11.8 the RDI was 14.4 in supine sleep he had more apnea than when sleeping on the side. Actually sleeping on his side did not reveal any apneas at all. There was no significant oxygen desaturation only 8.7 minutes of the whole night were spent with desaturated settings. Periodic limb movements were frequent and arousals from periodic limb movements were 5 per hour. The majority of arousals was from spontaneous arousals. His heart rate was bradycardic but in sinus rhythm. He had an abnormal sleep architecture as written REM sleep was actually not seen. He only slept for sustained periods of time after 2 AM. According to his wife at home he would actually sleep walk or roll after 2 AM. This was not what we saw in the sleep lab.  I wanted to discuss today a possible treatment of the mild apnea but I do not think that this is an essential part of his sleep problem. His wife headaches that he would  have significant adjustment problems with CPAP and I agree that this is probably not the optimal treatment option for him.  He seems to be walking all night he was actually talking to himself and pulling on the wires and tubes at night in the sleep lab he left the bed on several occasions, sometimes searching for the bathroom, and his EEG during that time suggested wakefulness and not REM sleep.   Review of Systems: Patient complains of symptoms per HPI as well as the following symptoms no SOB, no CP. Pertinent negatives per HPI. All others negative. 04-12-15  the patient endorsed the Epworth sleepiness score at 17 from  5 points and the fatigue severity score at  30 from 42 points the geriatric depression score at 7 from 3  points. Spouse endorsed epworth at 10. Lots of  daytime  naps,   Social History   Social History  . Marital Status: Married    Spouse Name: [name]  . Number of Children: 3  . Years of Education: AA degree   Occupational History  . retired from [employer]    Social History Main Topics  . Smoking status: Never Smoker   . Smokeless tobacco: Never Used  . Alcohol Use: No  . Drug Use: No  . Sexual Activity: Not on file   Other Topics Concern  . Not on file   Social History Narrative   Patient is married to [name]), has 3 children    Patient is right handed   Education is AA degree   Caffeine consumption is 1 cup daily    Family History  Problem Relation Age of Onset  . Allergies Brother   . Heart disease Sister   . Stroke Sister   . Clotting disorder Sister   . Alzheimer's disease Father   . Dementia Father     Past Medical History  Diagnosis Date  . Osteoarthritis   . Hyperlipidemia   . GERD (gastroesophageal reflux disease)   . Restless legs syndrome (RLS)   . Interstitial lung disease   . Pleural thickening   . Hemorrhoids   . Cognitive decline   . Parkinson disease   . B12 deficiency   . Memory loss   . Non-rapid eye movement sleep arousal  disorder, sleep walking type 04/12/2015    Past Surgical History  Procedure Laterality Date  . Cataract extraction  03/2008  . Lung decortication  1962  . Colonoscopy    . Hemorrhoid surgery  06/21/2012    Procedure: HEMORRHOIDECTOMY;  Surgeon: Clovis Pu. Cornett, MD;  Location: [facility];  Service: General;  Laterality: N/A;  . Examination under anesthesia  08/27/2012    Procedure: EXAM UNDER ANESTHESIA;  Surgeon: Maisie Fus A. Cornett, MD;  Location: [facility];  Service: General;  Laterality: N/A;    Current Outpatient Prescriptions  Medication Sig Dispense Refill  . acetaminophen (TYLENOL) 325 MG tablet Take 650 mg by mouth as needed.      Marland Kitchen aspirin 81 MG tablet Take 81 mg by mouth daily.      . carbidopa-levodopa (SINEMET IR) 25-100 MG per tablet Take 1 tablet by mouth 3 (three) times daily. 90 tablet 11  . ergocalciferol (VITAMIN D2) 50000 UNITS capsule Take 50,000 Units by mouth once a week.      . fluticasone (FLONASE) 50 MCG/ACT nasal spray 2 sprays by Nasal route daily.      Marland Kitchen FORADIL AEROLIZER 12 MCG capsule for inhaler place 1 capsule INTO INHALER AND INHALE TWICE DAILY 60 capsule 11  . guaiFENesin (MUCINEX) 600 MG 12 hr tablet Take 600 mg by mouth as needed.     . Polyethylene Glycol 3350 GRAN by Does not apply route.    Marland Kitchen RAPAFLO 8 MG CAPS capsule     . rivastigmine (EXELON) 1.5 MG capsule take 1 capsule by mouth twice a day 60 capsule 3  . rOPINIRole (REQUIP) 1 MG tablet Take 1 mg by mouth at bedtime. Takes 1 1/2 tab QHS    . vitamin B-12 (CYANOCOBALAMIN) 1000 MCG tablet Take 1,000 mcg by mouth daily.     No current facility-administered medications for this visit.    Allergies as of 06/03/2015 - Review Complete 06/03/2015  Allergen Reaction Noted  . Advair diskus [fluticasone-salmeterol]  05/16/2012  . Aloe Swelling 07/22/2012  . Ambien [zolpidem  tartrate] Other (See Comments) 05/01/2012  . Halcion [triazolam]  08/03/2014  . Horizant  [gabapentin]  08/03/2014  . Lyrica [pregabalin] Other (See Comments) 05/01/2012  . Sulfonamide derivatives    . Ciprofloxacin Rash     Vitals: BP 116/62 mmHg  Pulse 72  Resp 20  Ht 5\' 6"  (1.676 m)  Wt 158 lb (71.668 kg)  BMI 25.51 kg/m2 Last Weight:  Wt Readings from Last 1 Encounters:  06/03/15 158 lb (71.668 kg)   Last Height:   Ht Readings from Last 1 Encounters:  06/03/15 5\' 6"  (1.676 m)    General: The patient is awake, alert and appears not in acute distress. The patient is well groomed. Head: Normocephalic, atraumatic. Neck is supple. Mallampati 3 , neck circumference: 16. 00 Cardiovascular:  Regular rate and rhythm , without  murmurs or carotid bruit, and without distended neck veins. Respiratory: Lungs are clear to auscultation. Skin:  Without evidence of edema, or rash Trunk: BMI is not  elevated and patient  has normal posture.   Neurologic exam : The patient is awake and alert, oriented to place and time.  Memory subjective not described as intact. There was an impaired  attention span & concentration ability.  Speech is fluent without  dysarthria,  but with mild dysphonia . Mood and affect are appropriate.  Cranial nerves: Pupils are equal and briskly reactive to light. Funduscopic exam without  evidence of pallor or edema. Extraocular movements  in vertical and horizontal planes intact and without nystagmus. Visual fields by finger perimetry are intact. Hearing to finger rub intact.  Facial sensation intact to fine touch. Facial motor strength is symmetric and tongue and uvula move midline.  Motor exam: Normal tone and normal muscle bulk and symmetric normal strength in all extremities. No evidence of cog-wheeling   Sensory:  Fine touch, pinprick and vibration were tested in all extremities. Proprioception is tested in the upper extremities only and normal .  This was normal.  Coordination: Rapid alternating movements in the fingers/hands is tested and normal.  Finger-to-nose maneuver tested and normal without evidence of ataxia, dysmetria or tremor.  Gait and station: Patient walks without assistive device and is able and assisted stool climb up to the exam table. Strength within normal limits. Stance is stable and normal. Tandem gait is intact, the patient is able to turn with 3 steps 180. He can walk with everted and inverted toes. His right arm does not have any arm swing while he walks.  Steps are unfragmented. Romberg testing is normal.  Deep tendon reflexes: in the upper and lower extremities are symmetric and intact. Babinski maneuver response is downgoing.   Assessment:  After physical and neurologic examination, review of laboratory studies, imaging, neurophysiology testing and pre-existing records, assessment /problem list. I spent well over 30 minute face to face with the patiet and his spouse to redirect the medication treatment towards anti hallucination treatment. And drugs that do not o interfere with sinemet or cause PD symptoms in the long run.   Plan:  Treatment plan and additional workup ;  The patient is not sleep walking but has confusional arousals, he is not oriented to his surroundings. Some of these spells happen 3-5 times at night. He is frequently leaving the bed. Since Klonopin and melatonin made his sleep behaviors worse . He has visual hallucinations 'from the corner of his eye " , I will use NUPLAZID for treatment of visual hallucinations in Parkinson's Disease.  These hopefully correct some of  the sleep behaviors , too. If not, Rv in 2 month , to discuss plan B . The patient's spouse reports that his clearest mental cognitive and physically most stable times the first 4 hours after Sinemet intake. This is for 3 times a day. Unfortunately this is the medications that may cause visual hallucinations. I will not discontinue the medication but add a non-neuroleptic medication to it.   NUPLAZID  at 17 mg 2 tablets each  morning  Melvyn Novas, MD   [facility] [address] [place], [place] [address]  Phone [phone] Fax [phone]

## 2015-06-03 NOTE — Progress Notes (Signed)
Form along with demographics and copy of ins card faxed to Nuplazid at [phone]

## 2015-06-08 ENCOUNTER — Telehealth: Payer: Self-pay | Admitting: Neurology

## 2015-06-08 NOTE — Telephone Encounter (Signed)
Phone call from Wesley Pierce re: medication the Doctor wants to start him on Pimavanserin Tartrate (NUPLAZID) 17 MG TABS. They had a time with him over the weekend with sleep walking at night and now in the daytime.

## 2015-06-08 NOTE — Telephone Encounter (Signed)
17 mg 2 tabs together taking in AM, we forwarded  the form to speciality pharmacy -  They should  contact the patient  directly.

## 2015-06-08 NOTE — Telephone Encounter (Signed)
Please let him know that I spoke to Dr. Vickey Huger and I agree with starting the medication. Thanks

## 2015-06-08 NOTE — Telephone Encounter (Signed)
Called and spoke w/ pt wife. Advised that Dr. Lucia Gaskins spoke w/ Dr. Vickey Huger and she agrees with starting the medication. She is wondering how she is going to get medication. I checked on status and it looks like Rx was printed on 06/03/15. I told her I will check with Dr. Vickey Huger and call her back. She verbalized understanding.

## 2015-06-10 ENCOUNTER — Ambulatory Visit: Payer: Self-pay | Admitting: Neurology

## 2015-06-10 NOTE — Telephone Encounter (Signed)
Wife is calling back about Nuplazid medication, wants to know when is the best time to give him the medication?

## 2015-06-10 NOTE — Telephone Encounter (Signed)
Per Dr Dohmeier's note, patient should take meds in AM.  I called back and spoke with Ms Boone Master.  She expressed understanding and will call us back if anything further is needed.

## 2015-06-14 ENCOUNTER — Telehealth: Payer: Self-pay

## 2015-06-14 NOTE — Telephone Encounter (Signed)
[facility]) has approved the request for coverage on Nuplazid effective until 10/02/2015 Ref # [id number]

## 2015-06-16 ENCOUNTER — Telehealth: Payer: Self-pay | Admitting: Neurology

## 2015-06-16 NOTE — Telephone Encounter (Signed)
pts wife called and states that pt is not sleeping well. Hallucinating and sleep walking. Pt started new medication Pimavanserin Tartrate (NUPLAZID) 17 MG TABS about 6 days ago. Please call and advise  [phone]

## 2015-06-17 ENCOUNTER — Other Ambulatory Visit: Payer: Self-pay | Admitting: Neurology

## 2015-06-17 DIAGNOSIS — F03918 Unspecified dementia, unspecified severity, with other behavioral disturbance: Secondary | ICD-10-CM

## 2015-06-17 DIAGNOSIS — F0280 Dementia in other diseases classified elsewhere without behavioral disturbance: Secondary | ICD-10-CM

## 2015-06-17 DIAGNOSIS — F0391 Unspecified dementia with behavioral disturbance: Secondary | ICD-10-CM

## 2015-06-17 DIAGNOSIS — G3183 Dementia with Lewy bodies: Principal | ICD-10-CM

## 2015-06-17 NOTE — Telephone Encounter (Signed)
Patient is having more hallucinations, wandering at night,behavioral problems. Spoke to patient's wife. Advised it may take up to 2 weeks for Nuplazid to work. She scheduled the 3-day ambulatory eeg for next week.I also advised that he go to pcp and ensure there is no metabolic cause such as UTI. And I referred him to Dr. Donell Beers as well.  Dr. Vickey Huger - any way you can reach out to Dr. Donell Beers about scheduling an appointment for our mutual patient?

## 2015-06-28 ENCOUNTER — Encounter: Payer: Self-pay | Admitting: *Deleted

## 2015-06-28 ENCOUNTER — Telehealth: Payer: Self-pay | Admitting: Neurology

## 2015-06-28 NOTE — Telephone Encounter (Signed)
Called wife since son is not listed on DPR form. Expressed I cannot speak with son because he is not on this form. Told her EEG results not ready yet. We will call her once they are ready. She verbalized understanding.   She also rescheduled appt for 10/12 to 10/3. She wanted earlier appt. Told her to check in at 7:50am. She verbalized understanding.

## 2015-06-28 NOTE — Progress Notes (Signed)
Received physician referral notification for [facility]: "The study has been completed and has been loaded onto our server. We will send you and Dr. Lura Em additional information within 48 hours to let you know it has been scanned and a report generated for your review and interpretation"   Phone: [phone]

## 2015-06-28 NOTE — Telephone Encounter (Signed)
Patient's son [name] is calling to get the results of father's EEG. Please call.

## 2015-06-29 ENCOUNTER — Telehealth: Payer: Self-pay

## 2015-06-29 NOTE — Telephone Encounter (Signed)
Spoke to pt's wife and advised her that Dr. Vickey Huger recommends stopping the medication that seems to be causing the psychotic breakdowns. Pt's wife says she thinks this is the nuplazid that Dr. Vickey Huger started the pt on. She says she will stop giving her husband this medication. I advised her that Dr. Vickey Huger advised resuming the requip. Pt's wife verbalized understanding.  Pt's wife states they have an appt with Dr. Lucia Gaskins already on 10/3, but I offered her an appt with Dr. Vickey Huger as well. Appt made for 10/12 at 11:15. Pt's wife says that they will cancel the appt with Dr. Vickey Huger if Dr. Lucia Gaskins is able to answer all of their questions.

## 2015-06-29 NOTE — Telephone Encounter (Signed)
Thank you Wesley Pierce!   Dr. Lucia Gaskins-- just wanted to let you know, thank you!

## 2015-06-29 NOTE — Telephone Encounter (Signed)
Thanks emma.  

## 2015-06-29 NOTE — Telephone Encounter (Signed)
Dr. Lanell Matar office called and wanted to discuss pt care with Dr. Vickey Huger.

## 2015-06-29 NOTE — Telephone Encounter (Signed)
I have spoken to Dr. Jacky Kindle on the phone today and I would like for this particular patient to be seen in a brief 15 minute revisit somewhere in the next 14 days. In the meantime I have instructed Baxter Hire to ask the patient to discontinue the medication that he feels increases his psychotic breakdowns and resume the Requip. CD

## 2015-06-29 NOTE — Telephone Encounter (Signed)
Thanks Kara Mead, would you call them and ask them to fax the report to [place] when completed? I wonder how we can make this a standard request?

## 2015-06-30 NOTE — Telephone Encounter (Signed)
Called and spoke with [facility] from [facility]. She is going to fax over all ambulatory EEG results to [phone]. She also took Dr. Trevor Mace email so reports will also be sent to her email when faxed. She said some of the words/report will look blurry if faxed sometimes and she wants her to have the best results possible.

## 2015-06-30 NOTE — Telephone Encounter (Signed)
THANK YOU

## 2015-07-05 ENCOUNTER — Ambulatory Visit: Payer: Self-pay | Admitting: Neurology

## 2015-07-05 ENCOUNTER — Telehealth: Payer: Self-pay | Admitting: Neurology

## 2015-07-05 NOTE — Telephone Encounter (Signed)
Kara Mead - Please call patient and let him know that I received the results of patient's EEG:  This was a normal 72-hour ambulatory video EEG. No epileptiform discharges, focal/generalized slowing or electroclinical/electrographic abnormality were seen. There were 12 push-button events ('confused', 'walking'/'talking') with none of them showing any changes on the EEG or clinical evidence of seizures on video.  Thanks

## 2015-07-06 NOTE — Telephone Encounter (Signed)
I called the patient to give EEG results. I asked if they have seen Dr. Donell Beers or have an appointment to see him. Patient's wife stated that he does not accept their insurance ([employer]). I advised that I would let Dr. Lucia Gaskins know in case there is another doctor she would like to refer the patient to.

## 2015-07-06 NOTE — Telephone Encounter (Signed)
I called the patient and explained that his EEG was normal. He wife stated "that can't be right." I explained there were no abnormalities seen during the 72 hours that he was monitored and there were 12 push-button events but none of them showed any changes on the EEG. Patient and his wife verbalized understanding. Appointment was rescheduled from 10/5 to 10/14 d/t Dr. Lucia Gaskins being out of the office 10/5.

## 2015-07-07 ENCOUNTER — Ambulatory Visit: Payer: Self-pay | Admitting: Neurology

## 2015-07-07 ENCOUNTER — Other Ambulatory Visit: Payer: Self-pay | Admitting: Neurology

## 2015-07-07 DIAGNOSIS — F0391 Unspecified dementia with behavioral disturbance: Secondary | ICD-10-CM

## 2015-07-07 DIAGNOSIS — F03918 Unspecified dementia, unspecified severity, with other behavioral disturbance: Secondary | ICD-10-CM

## 2015-07-07 NOTE — Telephone Encounter (Signed)
Hi, I got cc'd on this. Dr Lolly Mustache sees geriatric psychiatry as well. Within [place] , he will accept [employer]/ And there is  Dr Pete Glatter , who is a geriatrician internist in [place]. Hope that helps. CD

## 2015-07-07 NOTE — Telephone Encounter (Signed)
Dr. Frances Furbish or Dr. Vickey Huger - Can you make any recommendations for geriatric psychiatrists in the area other than Dr. Archer Asa who manages behavioral disturbances in patients with Parkinson's Disease? Thank you.

## 2015-07-07 NOTE — Telephone Encounter (Signed)
I do believe, Dr. Lolly Mustache at [facility] is specialized in ger. Psychiatry.

## 2015-07-07 NOTE — Telephone Encounter (Signed)
Wesley Pierce, can you ask them to also contact Dr. Lolly Mustache at [facility]? i will give them a referral.

## 2015-07-12 NOTE — Telephone Encounter (Signed)
Tried mobile number. Automated system stated "The person you are trying to reach is not accepting calls at this time".

## 2015-07-12 NOTE — Telephone Encounter (Addendum)
Called home number. Phone kept ringing. Unable to leave message.

## 2015-07-13 NOTE — Telephone Encounter (Signed)
Lets make a RV appointment.

## 2015-07-13 NOTE — Telephone Encounter (Signed)
Pt has an appt tomorrow with Dr. Vickey Huger.

## 2015-07-13 NOTE — Telephone Encounter (Signed)
He has an appointment tomorrow with Porfirio Mylar and on the 14th with me. We can discuss at that time. I don't know if he had to stop the Nuplazid, if so maybe can discuss starting Seroquel at night if he hasn't tried that before. The 3-day viseo EEG was negative for any seizure or epileptiform activity so his behavior at night is likely all due to his parkinson's dementia. Will see what Dr. Vickey Huger thinks. thanks

## 2015-07-14 ENCOUNTER — Ambulatory Visit: Payer: Medicare Other | Admitting: Neurology

## 2015-07-14 ENCOUNTER — Ambulatory Visit: Payer: Self-pay | Admitting: Neurology

## 2015-07-14 ENCOUNTER — Ambulatory Visit (INDEPENDENT_AMBULATORY_CARE_PROVIDER_SITE_OTHER): Payer: Medicare Other | Admitting: Neurology

## 2015-07-14 ENCOUNTER — Encounter: Payer: Self-pay | Admitting: Neurology

## 2015-07-14 VITALS — BP 122/64 | HR 74 | Resp 20 | Ht 66.0 in | Wt 151.0 lb

## 2015-07-14 DIAGNOSIS — F03918 Unspecified dementia, unspecified severity, with other behavioral disturbance: Secondary | ICD-10-CM

## 2015-07-14 DIAGNOSIS — F0391 Unspecified dementia with behavioral disturbance: Secondary | ICD-10-CM

## 2015-07-14 DIAGNOSIS — F513 Sleepwalking [somnambulism]: Secondary | ICD-10-CM

## 2015-07-14 DIAGNOSIS — F29 Unspecified psychosis not due to a substance or known physiological condition: Secondary | ICD-10-CM | POA: Diagnosis not present

## 2015-07-14 MED ORDER — ROPINIROLE HCL 1 MG PO TABS: 1.5000 mg | ORAL_TABLET | Freq: Every day | ORAL | Status: DC

## 2015-07-14 MED ORDER — QUETIAPINE FUMARATE 25 MG PO TABS: 25.0000 mg | ORAL_TABLET | Freq: Every day | ORAL | Status: DC

## 2015-07-14 NOTE — Patient Instructions (Signed)
Quetiapine tablets What is this medicine? QUETIAPINE (kwe TYE a peen) is an antipsychotic. It is used to treat schizophrenia and bipolar disorder, also confusion and delusions.  This medicine may be used for other purposes; ask your health care provider or pharmacist if you have questions. What should I tell my health care provider before I take this medicine? They need to know if you have any of these conditions: -brain tumor or head injury -breast cancer -cataracts -diabetes -difficulty swallowing -heart disease -kidney disease -liver disease -low blood counts, like low white cell, platelet, or red cell counts -low blood pressure or dizziness when standing up -Parkinson's disease -previous heart attack -seizures -suicidal thoughts, plans, or attempt by you or a family member -thyroid disease -an unusual or allergic reaction to quetiapine, other medicines, foods, dyes, or preservatives -pregnant or trying to get pregnant -breast-feeding How should I use this medicine? Take this medicine by mouth. Swallow it with a drink of water. Follow the directions on the prescription label. If it upsets your stomach you can take it with food. Take your medicine at regular intervals. Do not take it more often than directed. Do not stop taking except on the advice of your doctor or health care professional. A special MedGuide will be given to you by the pharmacist with each prescription and refill. Be sure to read this information carefully each time. Talk to your pediatrician regarding the use of this medicine in children. While this drug may be prescribed for children as young as 10 years for selected conditions, precautions do apply. Patients over age 79 years may have a stronger reaction to this medicine and need smaller doses. Overdosage: If you think you have taken too much of this medicine contact a poison control center or emergency room at once. NOTE: This medicine is only for you. Do not  share this medicine with others. What if I miss a dose? If you miss a dose, take it as soon as you can. If it is almost time for your next dose, take only that dose. Do not take double or extra doses. What may interact with this medicine? Do not take this medicine with any of the following medications: -certain medicines for fungal infections like fluconazole, itraconazole, ketoconazole, posaconazole, voriconazole -cisapride -dofetilide -dronedarone -droperidol -grepafloxacin -halofantrine -phenothiazines like chlorpromazine, mesoridazine, thioridazine -pimozide -sparfloxacin -ziprasidone This medicine may also interact with the following medications: -alcohol -antiviral medicines for HIV or AIDS -certain medicines for blood pressure -certain medicines for depression, anxiety, or psychotic disturbances like haloperidol, lorazepam -certain medicines for diabetes -certain medicines for Parkinson's disease -certain medicines for seizures like carbamazepine, phenobarbital, phenytoin -cimetidine -erythromycin -other medicines that prolong the QT interval (cause an abnormal heart rhythm) -rifampin -steroid medicines like prednisone or cortisone This list may not describe all possible interactions. Give your health care provider a list of all the medicines, herbs, non-prescription drugs, or dietary supplements you use. Also tell them if you smoke, drink alcohol, or use illegal drugs. Some items may interact with your medicine. What should I watch for while using this medicine? Visit your doctor or health care professional for regular checks on your progress. It may be several weeks before you see the full effects of this medicine. Your health care provider may suggest that you have your eyes examined prior to starting this medicine, and every 6 months thereafter. If you have been taking this medicine regularly for some time, do not suddenly stop taking it. You must gradually reduce the dose  or your symptoms may get worse. Ask your doctor or health care professional for advice. Patients and their families should watch out for worsening depression or thoughts of suicide. Also watch out for sudden or severe changes in feelings such as feeling anxious, agitated, panicky, irritable, hostile, aggressive, impulsive, severely restless, overly excited and hyperactive, or not being able to sleep. If this happens, especially at the beginning of antidepressant treatment or after a change in dose, call your health care professional. Bonita QuinYou may get dizzy or drowsy. Do not drive, use machinery, or do anything that needs mental alertness until you know how this medicine affects you. Do not stand or sit up quickly, especially if you are an older patient. This reduces the risk of dizzy or fainting spells. Alcohol can increase dizziness and drowsiness. Avoid alcoholic drinks. Do not treat yourself for colds, diarrhea or allergies. Ask your doctor or health care professional for advice, some ingredients may increase possible side effects. This medicine can reduce the response of your body to heat or cold. Dress warm in cold weather and stay hydrated in hot weather. If possible, avoid extreme temperatures like saunas, hot tubs, very hot or cold showers, or activities that can cause dehydration such as vigorous exercise. What side effects may I notice from receiving this medicine? Side effects that you should report to your doctor or health care professional as soon as possible: -allergic reactions like skin rash, itching or hives, swelling of the face, lips, or tongue -difficulty swallowing -fast or irregular heartbeat -fever or chills, sore throat -fever with rash, swollen lymph nodes, or swelling of the face -increased hunger or thirst -increased urination -problems with balance, talking, walking -seizures -stiff muscles -suicidal thoughts or other mood changes -uncontrollable head, mouth, neck, arm, or  leg movements -unusually weak or tired Side effects that usually do not require medical attention (report to your doctor or health care professional if they continue or are bothersome): -change in sex drive or performance -constipation -drowsy or dizzy -dry mouth -stomach upset -weight gain This list may not describe all possible side effects. Call your doctor for medical advice about side effects. You may report side effects to FDA at 1-800-FDA-1088. Where should I keep my medicine? Keep out of the reach of children. Store at room temperature between 15 and 30 degrees C (59 and 86 degrees F). Throw away any unused medicine after the expiration date. NOTE: This sheet is a summary. It may not cover all possible information. If you have questions about this medicine, talk to your doctor, pharmacist, or health care provider.    2016, Elsevier/Gold Standard. (2015-03-23 13:07:35)

## 2015-07-14 NOTE — Progress Notes (Signed)
[facility]    Provider:  Dr Lucia Gaskins Referring Provider: [name], MD Primary Care Physician:  Minda Meo, MD  CC:  Sleep walking  HPI:  Wesley Pierce is a 81 y.o. male here as a referral from Dr. Jacky Kindle for sleep walking, parkinson's disease and RLS, B12 deficiency.  08/03/2014: Wesley Pierce is a 81 y.o. male here as a referral from Dr. Jacky Kindle for Parkinson's disease, RLS and memory changes. He is 81 years old and is a former patient of Dr. Hosie Poisson and is transitioning to me. He was found to have B12 deficiency and started on B12 injections. At last appointment was starte don low-dose Sinemet and titrated to one pill three times daily. Wife provides most history and updates. Things are gong very well. He is a "whole different person" when he is on the medicine. Can see a significant change and can tell when it wears off. They are taking the medications at 8:30am and is taking it 3x a day a whole pill. Tremor is improving and helps significantly. Still taking the injections. He has some sleeping problems. He takes Ropinerole and gabapentin at night. Usually he sleeps well when he gets to sleep. But he has some difficulty initiating sleep.  Currently taking Neurontin and Requip at bedtime for possible RLS. Is now currently taking Requip 1.5mg  and Gabapentin  nightly. Tried Gabapentin  and had hallucinations. Had tried higher dose of Requip but also suffered from hallucinations. Has had a few TIAs in the past. No strokes. Otherwise healthy. No family history of parkinson's or other neurodegenerative disorders  Basic lab work, including TSH, reviewed from PCP and was unremarkable.   Dr. Vickey Huger, 04-12-15  He continues to significant persistent episodes of sleep walking and confusion at night. Last night they went to bed at 10pm. That he endorsed as usual bedtime. He has always had trouble to initiate sleep, since childhood. He slept better on  Neurontin and Requip. He is more drowsy in daytime now, too.  He woke up last night at 1:30am, usually at 2 or 4 AM . His wife reports no calling out, no diaphoresis and no dilated pupils. He is not afraid.  He has no longer episodes every night, but when under stress.  It comes in clusters. It is usually after 2-4 AM, more likely REM, but not classic REM BD. Wesley Pierce reports that he usually watches with his wife TV before transferring to the bedroom and that there is no screen in the bedroom. He may repeat a little bit at night but usually there is no exciting activity or anything that would interrupt his ability to go to sleep. The bedroom is described as cool, quiet and dark - there tonight light so that he can find a way to the bathroom easier. He makes sure that he empties his bladder before going to bed. He does not drink caffeinated beverages in the afternoon and not before nighttime. He drinks some fluid throughout the afternoon and evening. He does also drinks apple juice besides water or a non-caffeine soda. He usually pretends to do something , an action/ project that he would have done in daytime. He has a special lock on the door. He no longer walks  into the shower trying to find his way out. This was frequently encountered while he was on Klonopin.  The patient is no longer on Klonopin or melatonin.  His family history is interesting for sleep disorders; Mrs. Yarbro advised me that their  mutual daughter is a sleep walker since childhood. she is now mostly a sleep talker in adulthood and that 2 sons are using CPAP for the treatment of obstructive sleep apnea.  Patient  carries a diagnosis of  COPD, and has undergone a surgical procedure to remove the  lining from the left lung. He carries a diagnosis of interstitial lung disease. Dr. Lucia GaskinsAhern had reported : "He was standing at the sink. He said he was helping people build a pond. He talks back to his wife but he does not remember these  episodes. He urinated on the side of the bed. She got him back to bed and he got up again due to RLS, he doesn't remember all of it. He went to the refrigerator and she followed him down. He didn' t know where the fridge was. He remembers getting water but doesn't know why he was so confused. He tosses and tumbles, he doesn't usually remembr the episodes at night. He does around 2 or 4am not knowing which one was the hot or cold spicket. He can't find the door knob at night. He remembers some episodes but others he does not. The wife has gotten him out of the closet many times when he gets lost at night. He can't get out of the closet."  Thought possibly this was REM sleep disorder but clonazepam made it worse. He tried melatonin which also made things worse. The melatonin and clonazepam have made things worse. He took [place] and destroyed the lamps one night.   Interval history from 06-03-15. Wesley Pierce underwent [facility] He was diagnosed with very mild sleep apnea the AHI was 11.8 the RDI was 14.4 in supine sleep he had more apnea than when sleeping on the side. Actually sleeping on his side did not reveal any apneas at all. There was no significant oxygen desaturation only 8.7 minutes of the whole night were spent with desaturated settings. Periodic limb movements were frequent and arousals from periodic limb movements were 5 per hour. The majority of arousals was from spontaneous arousals. His heart rate was bradycardic but in sinus rhythm. He had an abnormal sleep architecture as written REM sleep was actually not seen. He only slept for sustained periods of time after 2 AM. According to his wife at home he would actually sleep walk or roll after 2 AM. This was not what we saw in the sleep lab.  I wanted to discuss today a possible treatment of the mild apnea but I do not think that this is an essential part of his sleep problem. His wife headaches that he would have  significant adjustment problems with CPAP and I agree that this is probably not the optimal treatment option for him.  He seems to be walking all night he was actually talking to himself and pulling on the wires and tubes at night in the sleep lab he left the bed on several occasions, sometimes searching for the bathroom, and his EEG during that time suggested wakefulness and not REM sleep.The patient is not sleep walking but has confusional arousals, he is not oriented to his surroundings. Some of these spells happen 3-5 times at night. He is frequently leaving the bed. Since Klonopin and melatonin made his sleep behaviors worse , I will use NUPLAZID for treatment of visual hallucinations in Parkinson's Disease.  These hopefully correct some of the sleep behaviors, too. If not, Rv in 2 month, to discuss plan  B . The patient's spouse reports that his clearest mental cognitive and physically most stable times the first 4 hours after Sinemet intake. This is for 3 times a day. Unfortunately this is the medications that may cause visual hallucinations. I will not discontinue the medication but add a non-neuroleptic medication to it.  I prescribed. NUPLAZID  at 17 mg 2 tablets each morning.   Interval history from 07-14-15.  On behalf of Wesley Pierce, I had received a call from Dr. Lanell Matar primary care physician. He reported that the patient seemed to decline more rapidly after Nuplazid had been started. The patient was asked to discontinue the medication. In response we changed his Requip to a higher dose after talking to Dr. Jacky Kindle. The patient seemed to have done better with  Requip increase. His son, daughter in law and wife report today, he just progressed as before, and was not acutely worse on the medication. He has continued into the day with sleep walking, hallucinations at sun dawn and at  dusk. The family has noticed that he seems to clear up around breakfast time and be more cognitively intact and  alert. However Mrs. Hardie noted  him to be continuously actively acting out dreams and noted him to urinate all over the house. This morning at 3.45 he went to the toilet and than into the shower. He reported having played ball, and fallen into the muddy playing field. He is awake and can report on his hallucinations.   His wife doesn't get good sleep either.  We had already reviewed her previous visit the patient's sleep study. He had actually bradycardia very low heart rates during the night. No hypoxemia and only very mild sleep apnea I felt that an apnea index of 11.8 does not justify CPAP in a patient with these sleep behaviors. We also ordered a  EEG with 72 hour ambulation and  video. The video was performed on 6 9-20 116 through 06/24/2015. the patient had removed some of the electrodes that had to be re-attached. The EEG showed no epileptiform discharges and the patient appeared confused, had twitches or irregular muscle movements. The background of the EEG was reactive to eye movements. The he was very restless. Again the EKG was regular parasomnia activity non-epileptic etiology.    Review of Systems: Patient complains of symptoms per HPI as well as the following symptoms no SOB, no CP. Pertinent negatives per HPI. All others negative. 04-12-15  The patient endorsed the Epworth sleepiness score at 17 from 5 points and the fatigue severity score at  30 from 42 points the geriatric depression score at 7 from 3  points. Spouse endorsed epworth at 10. Lots of daytime naps, even here in the office.   Social History   Social History  . Marital Status: Married    Spouse Name: [name]  . Number of Children: 3  . Years of Education: AA degree   Occupational History  . retired from [employer]    Social History Main Topics  . Smoking status: Never Smoker   . Smokeless tobacco: Never Used  . Alcohol Use: No  . Drug Use: No  . Sexual Activity: Not on file   Other Topics Concern  . Not on file     Social History Narrative   Patient is married to [name]), has 3 children    Patient is right handed   Education is AA degree   Caffeine consumption is 1 cup daily    Family History  Problem Relation Age of Onset  . Allergies Brother   . Heart disease Sister   . Stroke Sister   . Clotting disorder Sister   . Alzheimer's disease Father   . Dementia Father     Past Medical History  Diagnosis Date  . Osteoarthritis   . Hyperlipidemia   . GERD (gastroesophageal reflux disease)   . Restless legs syndrome (RLS)   . Interstitial lung disease   . Pleural thickening   . Hemorrhoids   . Cognitive decline   . Parkinson disease   . B12 deficiency   . Memory loss   . Non-rapid eye movement sleep arousal disorder, sleep walking type 04/12/2015    Past Surgical History  Procedure Laterality Date  . Cataract extraction  03/2008  . Lung decortication  1962  . Colonoscopy    . Hemorrhoid surgery  06/21/2012    Procedure: HEMORRHOIDECTOMY;  Surgeon: Clovis Pu. Cornett, MD;  Location: [facility];  Service: General;  Laterality: N/A;  . Examination under anesthesia  08/27/2012    Procedure: EXAM UNDER ANESTHESIA;  Surgeon: Maisie Fus A. Cornett, MD;  Location: [facility];  Service: General;  Laterality: N/A;    Current Outpatient Prescriptions  Medication Sig Dispense Refill  . acetaminophen (TYLENOL) 325 MG tablet Take 650 mg by mouth as needed.      Marland Kitchen aspirin 81 MG tablet Take 81 mg by mouth daily.      . carbidopa-levodopa (SINEMET IR) 25-100 MG per tablet Take 1 tablet by mouth 3 (three) times daily. 90 tablet 11  . ergocalciferol (VITAMIN D2) 50000 UNITS capsule Take 50,000 Units by mouth once a week.      . fluticasone (FLONASE) 50 MCG/ACT nasal spray 2 sprays by Nasal route daily.      Marland Kitchen FORADIL AEROLIZER 12 MCG capsule for inhaler place 1 capsule INTO INHALER AND INHALE TWICE DAILY 60 capsule 11  . gabapentin (NEURONTIN) 300 MG capsule Take 300 mg by  mouth at bedtime.    Marland Kitchen guaiFENesin (MUCINEX) 600 MG 12 hr tablet Take 600 mg by mouth as needed.     . Polyethylene Glycol 3350 GRAN by Does not apply route.    Marland Kitchen RAPAFLO 8 MG CAPS capsule     . rivastigmine (EXELON) 1.5 MG capsule take 1 capsule by mouth twice a day 60 capsule 3  . rOPINIRole (REQUIP) 1 MG tablet Take 1.5 mg by mouth at bedtime. Takes 1 1/2 tab QHS    . vitamin B-12 (CYANOCOBALAMIN) 1000 MCG tablet Take 1,000 mcg by mouth daily.    Thereasa Solo Tartrate (NUPLAZID) 17 MG TABS Take 17 mg by mouth every morning. (Patient not taking: Reported on 07/14/2015) 60 tablet 3   No current facility-administered medications for this visit.    Allergies as of 07/14/2015 - Review Complete 07/14/2015  Allergen Reaction Noted  . Advair diskus [fluticasone-salmeterol]  05/16/2012  . Aloe Swelling 07/22/2012  . Ambien [zolpidem tartrate] Other (See Comments) 05/01/2012  . Halcion [triazolam]  08/03/2014  . Horizant [gabapentin]  08/03/2014  . Lyrica [pregabalin] Other (See Comments) 05/01/2012  . Sulfonamide derivatives    . Ciprofloxacin Rash     Vitals: BP 122/64 mmHg  Pulse 74  Resp 20  Ht 5\' 6"  (1.676 m)  Wt 151 lb (68.493 kg)  BMI 24.38 kg/m2 Last Weight:  Wt Readings from Last 1 Encounters:  07/14/15 151 lb (68.493 kg)   Last Height:   Ht Readings from Last 1 Encounters:  07/14/15  (1.676 m)    General: The patient is awake, alert and appears not in acute distress. The patient is well groomed. Head: Normocephalic, atraumatic. Neck is supple. Mallampati 3 , neck circumference: 16. 00 Cardiovascular:  Regular rate and rhythm , without  murmurs or carotid bruit, and without distended neck veins. Respiratory: Lungs are clear to auscultation. Skin:  Without evidence of edema, or rash Trunk: BMI is not  elevated and patient  has normal posture.   Neurologic exam : The patient is awake and alert, oriented to place and time.  Memory subjective not described as  intact.  Severly progressed/  declined. There was an impaired attention span & concentration ability. He fell asleep in the examination twice. Speech is fluent without dysarthria,  but with mild dysphonia . Mood and affect are appropriate.  Cranial nerves: Pupils are equal and briskly reactive to light. . Extraocular movements  in vertical and horizontal planes intact and without nystagmus. Visual fields by finger perimetry are intact. Hearing to finger rub intact.  Facial sensation intact to fine touch. Facial motor strength is symmetric and tongue and uvula move midline.  Motor exam: Normal tone and normal muscle bulk and symmetric normal strength in all extremities. No evidence of cog-wheeling. No rigor over the wrists , but shuffling gait. Sensory:  Fine touch, pinprick and vibration were tested in all extremities. Proprioception is tested in the upper extremities only and normal. This was normal. Coordination: Rapid alternating movements in the fingers/hands is tested and normal. Finger-to-nose maneuver tested and normal without evidence of ataxia, dysmetria or tremor. Gait and station: Patient walks without assistive device and is able and assisted stool climb up to the exam table. Strength within normal limits. Stance is stable and normal. Tandem gait is intact, the patient is able to turn with 3 steps 180. His right arm does not have any arm swing while he walks.  Steps are shuffling now, but turn remain  unfragmented. Romberg testing is normal.  Deep tendon reflexes: in the upper and lower extremities are symmetric and intact. Babinski maneuver response is downgoing.   Assessment:  After physical and neurologic examination, review of laboratory studies, imaging, neurophysiology testing and pre-existing records, assessment /problem list. I spent well over 50% of the 45 minute face to face with the patient and his spouse, their son and and daughter - in law.  I redirected the medication  treatment towards anti hallucination treatment. He will stay on Requip and I will start Zyprexa.  I were difficulty is to treat the psychosis that seems to be associated with the sleep movements. The patient is sleepwalking his leaving the bed he recalls and actually acts out dreams outside the bedroom or the bed proper. This is atypical for a simple REM sleep behavior disorder. In addition he has been having her cognitive decline as well and he is excessively daytime sleepy not getting any rest at night. The same is true for his wife. I suggested he start on a low-dose of Seroquel or Zyprexa at night to help him get hopefully 5-6 hours of more restful sleep.  These medications however can cause a significant hangover effect the next morning. His fall risk has to be addressed. His bathroom is in direct attachment to a bedroom so he does not have a long walk and he does not have to pass stairs or doorways.  Plan:  Treatment plan and additional workup ;  Seroquel 25 mg at 9- 10 PM by  mouth . Requip at current dose.   Rv in 3 month.   Melvyn Novas, MD   [facility] [address] [place], [place] [address]  Phone [phone] Fax [phone]    Cc Dr Tawni Levy.

## 2015-07-15 ENCOUNTER — Telehealth: Payer: Self-pay

## 2015-07-15 LAB — CBC WITH DIFFERENTIAL/PLATELET
Basophils Absolute: 0 10*3/uL (ref 0.0–0.2)
Basos: 0 %
EOS (ABSOLUTE): 0.2 10*3/uL (ref 0.0–0.4)
EOS: 2 %
HEMATOCRIT: 41.6 % (ref 37.5–51.0)
HEMOGLOBIN: 14.2 g/dL (ref 12.6–17.7)
Immature Grans (Abs): 0 10*3/uL (ref 0.0–0.1)
Immature Granulocytes: 0 %
LYMPHS ABS: 1.4 10*3/uL (ref 0.7–3.1)
Lymphs: 17 %
MCH: 30.1 pg (ref 26.6–33.0)
MCHC: 34.1 g/dL (ref 31.5–35.7)
MCV: 88 fL (ref 79–97)
MONOCYTES: 12 %
Monocytes Absolute: 1 10*3/uL — ABNORMAL HIGH (ref 0.1–0.9)
NEUTROS ABS: 5.5 10*3/uL (ref 1.4–7.0)
Neutrophils: 69 %
Platelets: 232 10*3/uL (ref 150–379)
RBC: 4.71 x10E6/uL (ref 4.14–5.80)
RDW: 13.6 % (ref 12.3–15.4)
WBC: 8 10*3/uL (ref 3.4–10.8)

## 2015-07-15 LAB — COMPREHENSIVE METABOLIC PANEL
ALBUMIN: 4.1 g/dL (ref 3.5–4.7)
ALK PHOS: 84 IU/L (ref 39–117)
ALT: 11 IU/L (ref 0–44)
AST: 16 IU/L (ref 0–40)
Albumin/Globulin Ratio: 1.8 (ref 1.1–2.5)
BILIRUBIN TOTAL: 0.4 mg/dL (ref 0.0–1.2)
BUN / CREAT RATIO: 14 (ref 10–22)
BUN: 11 mg/dL (ref 8–27)
CO2: 24 mmol/L (ref 18–29)
CREATININE: 0.79 mg/dL (ref 0.76–1.27)
Calcium: 8.9 mg/dL (ref 8.6–10.2)
Chloride: 100 mmol/L (ref 97–108)
GFR calc Af Amer: 97 mL/min/{1.73_m2} (ref >59)
GFR calc non Af Amer: 84 mL/min/{1.73_m2} (ref >59)
GLUCOSE: 94 mg/dL (ref 65–99)
Globulin, Total: 2.3 g/dL (ref 1.5–4.5)
Potassium: 4.7 mmol/L (ref 3.5–5.2)
Sodium: 137 mmol/L (ref 134–144)
Total Protein: 6.4 g/dL (ref 6.0–8.5)

## 2015-07-15 NOTE — Telephone Encounter (Signed)
-----   Message from Melvyn Novasarmen Dohmeier, MD sent at 07/15/2015  4:32 PM EDT ----- normal CMET , CBC

## 2015-07-15 NOTE — Telephone Encounter (Signed)
Attempted to call pt to give lab results. Labs are normal. Both numbers keep ringing. Will try again later.

## 2015-07-16 ENCOUNTER — Ambulatory Visit (INDEPENDENT_AMBULATORY_CARE_PROVIDER_SITE_OTHER): Payer: Medicare Other | Admitting: Neurology

## 2015-07-16 ENCOUNTER — Encounter: Payer: Self-pay | Admitting: Neurology

## 2015-07-16 VITALS — BP 111/60 | HR 55 | Ht 66.0 in | Wt 153.0 lb

## 2015-07-16 DIAGNOSIS — G20 Parkinson's disease: Secondary | ICD-10-CM | POA: Diagnosis not present

## 2015-07-16 DIAGNOSIS — F0281 Dementia in other diseases classified elsewhere with behavioral disturbance: Secondary | ICD-10-CM

## 2015-07-16 DIAGNOSIS — F02818 Dementia in other diseases classified elsewhere, unspecified severity, with other behavioral disturbance: Secondary | ICD-10-CM

## 2015-07-16 NOTE — Progress Notes (Signed)
Wesley Pierce    Provider:  Dr Lucia Gaskins Referring Provider: [name], MD Primary Care Physician:  Minda Meo, MD  CC: Confusional episodes   Interval history 10/14/102016: Wesley Pierce is a 81 y.o. male here as a follow up. He has Parkinson's with good response to Sinemet, dementia, RLS, B12 deficiency.He is having confusional episodes at night. Klonopin and melationin worsened symptoms, Nuplazid did not work. He was diagnosed with very mild sleep study. A 3-day VEEG did not show seizre activity. There was no significant oxygen desaturation only 8.7 minutes of the whole night were spent with desaturated settings. Periodic limb movements were frequent and arousals from periodic limb movements were 5 per hour. The majority of arousals was from spontaneous arousals. His requip was decreased at night to see if this was causing the confusion however he did not do well and it had to be increased again. He destroyed a lampshade in the den this past week. He has confusional episodes, sleepy during the day.   Had a long talk with patient, his wife and son who was with him. He is confusional episodes are likely due to Parkinson's disease and Parkinson's disease dementia. Dr. Dyanne Iha started Seroquel at night and I did encourage them to try it. I tried to refer them to Dr. Berniece Andreas was a geriatric psychiatrist however he did not take their insurance, recommended possibly seeing Dr. Suzie Portela who is also a geriatric psychiatrist.  I do feel that Seroquel is a good choice and this can be titrated as well.  Interval history 05/24/2015 : Wesley Pierce is a 81 y.o. male here as a follow up. He has Parkinson's with good response to Sinemet, dementia, RLS, B12 deficiency. He continues to sleep walk and recently had a sleep study. He is here to discuss his sleep study. However he does has a follow up with Dr. Vickey Huger on the 1st of September. I have explained to him that  following up with Dr. Vickey Huger for sleep is appropriate but I can give him an initial review. He continues to sleep walk. The other night he urinated on his couch in the middle of the night. Results showed that he was talking to himself and pulling out his lines during wakefullness, there was no REM sleep to evaluate for REM sleep disorder, EEG montage did not show seizures. Sleep study recommendation was to decrease any medications that could cause confusion, he only takes neurontin at night. Can trial stopping that but doubtful this is the cause. May consider an ambulatory eeg.   Interval history 03/04/2015: He continues to significant persistent episodes of sleep walking and confusion at night. Last night they went to bed at 10pm. He woke up last night at 1:30am. He was standing at the sink. He said he was helping people build a pond. He talks back to his wife but he does not remember these episodes. He urinated on the side of the bed. She got him back to bed and he got up again due to RLS, he doesn't remember all of it. He went to the refrigerator and she followed him down. He didn' t know where the fridge was. He remembers getting water but doesn't know why he was so confused. He tosses and tumbles, he doesn't usually remembr the episodes at night. He does around 2 or 4am not knowing which one was the hot or cold spicket. He can't find the door knob at night. He remembers some episodes but others he does  not. The wife has gotten him out of the closet many times when he gets lost at night. He can't get out of the closet.   Thought possibly this was REM sleep disorder but clonazepam made it worse. He sas tried melatonin which also made things worse. The melatonin and clonazepam have made things worse. But these episodes started even before he tried medication. Unclear if this is a medication effect or not. He took [place] and destroyed the lamps one night.   Interval update 11/03/2014:  Patient is  sleepwalking. He wakes up and moves. For example, he told his wife he was looking for a lantern. A lot of times he remembers sometimes he doesnt. Usually wife will ask a question and he can give her an answer. Sometimes there is a little agitation, may hit the bed. Agitation is rare, mostly confusional wandering. Last night he was in the kitchen looking for water.   His feet are hurting. The bottoms are tingly. He has to stand up and walk around. He tries to sit down and read which helps some. The bottom of his right foot feels sore. It has been going on for 5 years.   Initial visit 08/03/2014: Wesley Pierce is a 81 y.o. male here as a referral from Dr. Jacky KindleAronson for Parkinson's disease, RLS and memory changes. He is 81 years old and is a former patient of Dr. Hosie PoissonSumner and is transitioning to me. He was found to have B12 deficiency and started on B12 injections. At last appointment was starte don low-dose Sinemet and titrated to one pill three times daily.  Wife provides most history and updates. Things are gong very well. He is a "whole different person" when he is on the medicine. Can see a significant change and can tell when it wears off. They are taking the medications at 8:30am and is taking it 3x a day a whole pill. Tremor is improving and helps significantly. Still taking the injections. He has some sleeping problems. He takes Ropinerole and gabapentin at night. Usually he sleeps well when he gets to sleep. But he has some difficulty initiating sleep.   Currently taking Neurontin and Requip at bedtime for possible RLS. Is now currently taking Requip 1.5mg  and Gabapentin 300mg  nightly. Tried Gabapentin 600mg  and had hallucinations. Had tried higher dose of Requip but also suffered from hallucinations.   Has had a few TIAs in the past. No strokes. Otherwise healthy.   No family history of parkinson's or other neurodegenerative disorders   Basic lab work, including TSH, reviewed from PCP and was  unremarkable.   Social History   Social History  . Marital Status: Married    Spouse Name: Wesley Pierce  . Number of Children: 3  . Years of Education: AA degree   Occupational History  . retired from [employer]    Social History Main Topics  . Smoking status: Never Smoker   . Smokeless tobacco: Never Used  . Alcohol Use: No  . Drug Use: No  . Sexual Activity: Not on file   Other Topics Concern  . Not on file   Social History Narrative   Patient is married to Wesley Hatch(Ann), has 3 children    Patient is right handed   Education is AA degree   Caffeine consumption is 1 cup daily    Family History  Problem Relation Age of Onset  . Allergies Brother   . Heart disease Sister   . Stroke Sister   . Clotting disorder Sister   .  Alzheimer's disease Father   . Dementia Father     Past Medical History  Diagnosis Date  . Osteoarthritis   . Hyperlipidemia   . GERD (gastroesophageal reflux disease)   . Restless legs syndrome (RLS)   . Interstitial lung disease (HCC)   . Pleural thickening   . Hemorrhoids   . Cognitive decline   . Parkinson disease (HCC)   . B12 deficiency   . Memory loss   . Non-rapid eye movement sleep arousal disorder, sleep walking type 04/12/2015    Past Surgical History  Procedure Laterality Date  . Cataract extraction  03/2008  . Lung decortication  1962  . Colonoscopy    . Hemorrhoid surgery  06/21/2012    Procedure: HEMORRHOIDECTOMY;  Surgeon: Clovis Pu. Cornett, MD;  Location: [facility];  Service: General;  Laterality: N/A;  . Examination under anesthesia  08/27/2012    Procedure: EXAM UNDER ANESTHESIA;  Surgeon: Maisie Fus A. Cornett, MD;  Location: [facility];  Service: General;  Laterality: N/A;    Current Outpatient Prescriptions  Medication Sig Dispense Refill  . acetaminophen (TYLENOL) 325 MG tablet Take 650 mg by mouth as needed.      Marland Kitchen aspirin 81 MG tablet Take 81 mg by mouth daily.      . carbidopa-levodopa (SINEMET IR)  25-100 MG per tablet Take 1 tablet by mouth 3 (three) times daily. 90 tablet 11  . ergocalciferol (VITAMIN D2) 50000 UNITS capsule Take 50,000 Units by mouth once a week.      . fluticasone (FLONASE) 50 MCG/ACT nasal spray 2 sprays by Nasal route daily.      Marland Kitchen FORADIL AEROLIZER 12 MCG capsule for inhaler place 1 capsule INTO INHALER AND INHALE TWICE DAILY 60 capsule 11  . gabapentin (NEURONTIN) 300 MG capsule Take 300 mg by mouth at bedtime.    Marland Kitchen guaiFENesin (MUCINEX) 600 MG 12 hr tablet Take 600 mg by mouth as needed.     . Polyethylene Glycol 3350 GRAN by Does not apply route.    Marland Kitchen QUEtiapine (SEROQUEL) 25 MG tablet Take 1 tablet (25 mg total) by mouth at bedtime. 30 tablet 2  . RAPAFLO 8 MG CAPS capsule     . rivastigmine (EXELON) 1.5 MG capsule take 1 capsule by mouth twice a day 60 capsule 3  . rOPINIRole (REQUIP) 1 MG tablet Take 1.5 tablets (1.5 mg total) by mouth at bedtime. Takes 1 1/2 tab QHS 45 tablet 5  . vitamin B-12 (CYANOCOBALAMIN) 1000 MCG tablet Take 1,000 mcg by mouth daily.     No current facility-administered medications for this visit.    Allergies as of 07/16/2015 - Review Complete 07/16/2015  Allergen Reaction Noted  . Advair diskus [fluticasone-salmeterol]  05/16/2012  . Aloe Swelling 07/22/2012  . Ambien [zolpidem tartrate] Other (See Comments) 05/01/2012  . Halcion [triazolam]  08/03/2014  . Horizant [gabapentin]  08/03/2014  . Lyrica [pregabalin] Other (See Comments) 05/01/2012  . Sulfonamide derivatives    . Ciprofloxacin Rash     Vitals: BP 111/60 mmHg  Pulse 55  Ht  (1.676 m)  Wt 153 lb (69.4 kg)  BMI 24.71 kg/m2 Last Weight:  Wt Readings from Last 1 Encounters:  07/16/15 153 lb (69.4 kg)   Last Height:   Ht Readings from Last 1 Encounters:  07/16/15  (1.676 m)    Coordination: intermittent rest tremor noted in right hand (including with distraction), mild postural tremor L>RUE, mild bradykinesia with finger tapping bilateral, normal  foot taps bilat.  Gait:  stands without assistance, upright, slight shuffling of steps, normal arm swing, negative Romberg, negative pull test  Tone:  Increased on the right with facilitation   Sensory exam: Decrease temperature and vibration distally in the great toes )4 sec vibration). Intact pin prick and proprioception.    Assessment/Plan: TALIB HEADLEY is a 81 y.o. male here as a follow up. He has Parkinson's with good response to Sinemet, dementia, RLS, B12 deficiency.He is having confusional episodes at night. Klonopin and melationin worsened symptoms, Nuplazid did not work. He was diagnosed with very mild sleep study. A 3-day VEEG did not show seizre activity. There was no significant oxygen desaturation only 8.7 minutes of the whole night were spent with desaturated settings. Periodic limb movements were frequent and arousals from periodic limb movements were 5 per hour. The majority of arousals was from spontaneous arousals. His requip was decreased at night to see if this was causing the confusion however he did not do well and it had to be increased again. He destroyed a lampshade in the den this past week. He has confusional episodes, sleepy during the day.   Confusion at night likely due to his Parkinson's disease and Parkinson's disease dementia. Dr. Vickey Huger started Seroquel which I think is a good choice. Continue Sinemet at current dose. We can increase the Seroquel as needed at night. Did recommend following with a geriatric psychiatrist if this does not work.    Naomie Dean, MD  [facility] [address] [place], [place] [address]  Phone [phone] Fax [phone]  A total of 30 minutes was spent face-to-face with this patient. Over half this time was spent on counseling patient on the Parkinson's disease and Parkinson's disease dementia diagnosis and different diagnostic and therapeutic options available.

## 2015-07-16 NOTE — Patient Instructions (Signed)
Overall you are doing fairly well but I do want to suggest a few things today:   Remember to drink plenty of fluid, eat healthy meals and do not skip any meals. Try to eat protein with a every meal and eat a healthy snack such as fruit or nuts in between meals. Try to keep a regular sleep-wake schedule and try to exercise daily, particularly in the form of walking, 20-30 minutes a day, if you can.   Dr Lolly MustacheArfeen geriatric psychiatry. Start Seroquel at night.   Our phone number is [phone]. We also have an after hours call service for urgent matters and there is a physician on-call for urgent questions. For any emergencies you know to call 911 or go to the nearest emergency room

## 2015-07-18 DIAGNOSIS — G20A1 Parkinson's disease without dyskinesia, without mention of fluctuations: Secondary | ICD-10-CM | POA: Insufficient documentation

## 2015-07-18 DIAGNOSIS — G20 Parkinson's disease: Secondary | ICD-10-CM

## 2015-07-18 DIAGNOSIS — F0280 Dementia in other diseases classified elsewhere without behavioral disturbance: Secondary | ICD-10-CM | POA: Insufficient documentation

## 2015-07-19 ENCOUNTER — Other Ambulatory Visit: Payer: Self-pay | Admitting: *Deleted

## 2015-07-19 ENCOUNTER — Telehealth: Payer: Self-pay | Admitting: Neurology

## 2015-07-19 DIAGNOSIS — G3183 Dementia with Lewy bodies: Principal | ICD-10-CM

## 2015-07-19 DIAGNOSIS — F03918 Unspecified dementia, unspecified severity, with other behavioral disturbance: Secondary | ICD-10-CM

## 2015-07-19 DIAGNOSIS — F02818 Dementia in other diseases classified elsewhere, unspecified severity, with other behavioral disturbance: Secondary | ICD-10-CM

## 2015-07-19 DIAGNOSIS — F0281 Dementia in other diseases classified elsewhere with behavioral disturbance: Secondary | ICD-10-CM

## 2015-07-19 DIAGNOSIS — F0391 Unspecified dementia with behavioral disturbance: Secondary | ICD-10-CM

## 2015-07-19 DIAGNOSIS — F0280 Dementia in other diseases classified elsewhere without behavioral disturbance: Secondary | ICD-10-CM

## 2015-07-19 MED ORDER — FORMOTEROL FUMARATE 12 MCG IN CAPS: ORAL_CAPSULE | RESPIRATORY_TRACT | Status: DC

## 2015-07-19 NOTE — Telephone Encounter (Signed)
Wife called regarding Dr. Lucia GaskinsAhern referring husband to Dr. Arfeen/Geriatric Psychologist. Their office needs records prior to seeing patient. Please call wife [phone].

## 2015-07-19 NOTE — Telephone Encounter (Signed)
Spoke to pt and informed him that his labs were normal. Pt verbalized understanding.

## 2015-07-19 NOTE — Telephone Encounter (Signed)
-----   Message from Carmen Dohmeier, MD sent at 07/15/2015  4:32 PM EDT ----- normal CMET , CBC 

## 2015-07-19 NOTE — Telephone Encounter (Signed)
Placed referral to Dr. Arfeen/Geriatric Psychologist. Will ask Darreld McleanDana Cox to send referral to their office.

## 2015-07-20 NOTE — Telephone Encounter (Signed)
Checked on status of referral. Referral sent on 07/19/15.

## 2015-07-20 NOTE — Telephone Encounter (Signed)
Called and checked status on referral [phone] left a message. I will call and speak to wife and let her know what status is. Called and spoke to wife gave her telephone number she is going to call and leave them a message as well. Wife will call me back if she has not hurd anything by the end of the week. Patient's wife was satisfied.

## 2015-07-23 ENCOUNTER — Telehealth: Payer: Self-pay | Admitting: Emergency Medicine

## 2015-07-23 NOTE — Telephone Encounter (Signed)
Spoke with pt and wife, states that Foradil is no longer available.  Requesting an alternative medication. RB please advise.  Thanks!

## 2015-07-26 ENCOUNTER — Telehealth: Payer: Self-pay | Admitting: Emergency Medicine

## 2015-07-26 NOTE — Telephone Encounter (Signed)
A few options to choose from. Check or have him check (if possible) to see if formulary includes either Arcapta or Striverdi. Either of these would be a good substitute

## 2015-07-26 NOTE — Telephone Encounter (Signed)
Called made spouse aware of below. They will check formulary and call back. Nothing further needed at this time

## 2015-07-26 NOTE — Telephone Encounter (Signed)
Pt's wife called sts she called office but has not heard anything. Please call and advise.

## 2015-07-26 NOTE — Telephone Encounter (Signed)
Spoke with Wesley Pierce and reports arcapata and strivderdi is non formulary. She was given alternative of spiriva or ipratropium bromide. Please advise RB thanks

## 2015-07-26 NOTE — Telephone Encounter (Signed)
Pt called returning Dana's call.

## 2015-07-26 NOTE — Telephone Encounter (Signed)
Spoke to patient and his wife he will go to Dr. Nolen MuMckinney office this week .

## 2015-07-26 NOTE — Telephone Encounter (Signed)
[facility] Called [facility] office they can see patient this week but they need to speak to patient's wife first. Called and relayed to patient's wife and she will call me back if she is going to go with Dr. Loralie ChampagneMckinney's office.

## 2015-07-27 ENCOUNTER — Encounter: Payer: Self-pay | Admitting: Neurology

## 2015-07-27 NOTE — Telephone Encounter (Signed)
Spoke with pt, advised we were waiting to hear from RB on alternative med.  Pt used last dose this morning. RB please advise on alternative med.  Thanks!

## 2015-07-27 NOTE — Telephone Encounter (Signed)
Pt wife is calling to check on the status of husbands med's she says that he is out of it, please advise.Caren GriffinsStanley A Pierce

## 2015-07-27 NOTE — Telephone Encounter (Signed)
Neither Spiriva nor ipratropium are in appropriate substitute Foradil. These are different class medications. We need to know which long-acting beta agonist is on his formulary. The only other choice to consider is Serevent. Please see if this is acceptable

## 2015-07-28 ENCOUNTER — Telehealth: Payer: Self-pay | Admitting: Neurology

## 2015-07-28 ENCOUNTER — Other Ambulatory Visit: Payer: Self-pay | Admitting: Neurology

## 2015-07-28 DIAGNOSIS — F29 Unspecified psychosis not due to a substance or known physiological condition: Secondary | ICD-10-CM

## 2015-07-28 DIAGNOSIS — F513 Sleepwalking [somnambulism]: Secondary | ICD-10-CM

## 2015-07-28 MED ORDER — QUETIAPINE FUMARATE 25 MG PO TABS: 50.0000 mg | ORAL_TABLET | Freq: Every day | ORAL | Status: DC

## 2015-07-28 MED ORDER — SALMETEROL XINAFOATE 50 MCG/DOSE IN AEPB: 1.0000 | INHALATION_SPRAY | Freq: Two times a day (BID) | RESPIRATORY_TRACT | Status: DC

## 2015-07-28 NOTE — Telephone Encounter (Signed)
Spoke with pt's wife. She is aware of RB's response and recommendation. They are going to check to see if Serevent is on their formulary and let us know. Will await their return call.

## 2015-07-28 NOTE — Telephone Encounter (Signed)
She can increase the Seroquel to 50mg  at night and see how that goes. If he does well, she can wait on the other appointment thanks!

## 2015-07-28 NOTE — Telephone Encounter (Signed)
Patient's wife is calling and has found that insurance does pay for Serevent.  Can be reached at [phone].

## 2015-07-28 NOTE — Telephone Encounter (Signed)
Rx has been sent in. Pt's wife is aware. Nothing further was needed. 

## 2015-07-28 NOTE — Telephone Encounter (Signed)
Called wife back. Advised per Dr. Lucia GaskinsAhern that she can increase the seroquel to 50mg  at night and see how he does with this dose. If he does well, she can wait on other appt. Told her to call if he has any side effects/has any questions. He has appt with Dr. Donell BeersPlovsky on October 07, 2014. She will contact Dr. Loralie ChampagneMckinney's office if they end up choosing to cancel. She is going to see how he does on increased dose. Told her to call if she has any questions.

## 2015-07-28 NOTE — Telephone Encounter (Signed)
Called patient/spouse. No answer.

## 2015-07-28 NOTE — Telephone Encounter (Signed)
Pt's wife called and says that QUEtiapine (SEROQUEL) 25 MG tablet is working well. Pt is still sleep walking though and is wondering the dosage needs to increase, but she is very happy with that. She also states she has an appt with Dr. Loralie ChampagneMcKinney's NP and the it is not in network and has to pay over $200. She wants to know if they can wait on that appt. On Nov. 2nd. Since pt is doing well on Seroquel. Please call and advise [phone]

## 2015-08-02 NOTE — Telephone Encounter (Signed)
Pt's wife called sts when seroquel was increased he began having nightmares, sleep walking. She has reduced to 25mg  07/29/15 and he is doing well. She is inquiring since he doing much better on lower dose, does he still need to see psychologist on Wed (08/04/15). Please call and advise at [phone], can LM

## 2015-08-02 NOTE — Telephone Encounter (Signed)
I would still prefer that he sees the psychologist but it is up to wife if he is doing well on the seroquel 25mg . Thanks.

## 2015-08-02 NOTE — Telephone Encounter (Signed)
LVM for wife, returning her call. Advised that Dr Lucia GaskinsAhern prefer he still see psychologist, but up to her if he is doing well on seroquel 25mg . Gave [facility] phone number if she has further questions.

## 2015-08-17 ENCOUNTER — Telehealth: Payer: Self-pay | Admitting: Neurology

## 2015-08-17 NOTE — Telephone Encounter (Signed)
Called back. Advised Dr Lucia GaskinsAhern is aware. She verbalized understanding.

## 2015-08-17 NOTE — Telephone Encounter (Signed)
Lemonica with [facility] [employer] sts pt stopped taking Nuplazid. She spoke with wife 08/09/15 and she sts pt is not taking Nuplazid due to it not working the month he took which was October. She can be reached at [phone]

## 2015-09-03 ENCOUNTER — Telehealth: Payer: Self-pay | Admitting: Emergency Medicine

## 2015-09-03 NOTE — Telephone Encounter (Signed)
We received a letter stating that after the first of the year the pt's insurance will no longer cover Foradil. The alternatives that will be covered are Incruse, Serevent or Spiriva.  RB - please advise what alternative you would like to switch the patient to. Thanks.

## 2015-09-03 NOTE — Telephone Encounter (Signed)
I'd like to change him to either Spiriva or Incruse, whichever is lower tier.

## 2015-09-03 NOTE — Telephone Encounter (Signed)
It seems that this issue has already been addressed. RB changed Foradil to Serevent back in October. Nothing further was needed at this time.

## 2015-09-13 ENCOUNTER — Telehealth: Payer: Self-pay | Admitting: Neurology

## 2015-09-13 ENCOUNTER — Encounter: Payer: Self-pay | Admitting: Neurology

## 2015-09-13 ENCOUNTER — Ambulatory Visit (INDEPENDENT_AMBULATORY_CARE_PROVIDER_SITE_OTHER): Payer: Medicare Other | Admitting: Neurology

## 2015-09-13 VITALS — BP 123/64 | HR 62 | Ht 66.0 in | Wt 157.2 lb

## 2015-09-13 DIAGNOSIS — F02818 Dementia in other diseases classified elsewhere, unspecified severity, with other behavioral disturbance: Secondary | ICD-10-CM

## 2015-09-13 DIAGNOSIS — F0281 Dementia in other diseases classified elsewhere with behavioral disturbance: Secondary | ICD-10-CM

## 2015-09-13 DIAGNOSIS — G20 Parkinson's disease: Principal | ICD-10-CM

## 2015-09-13 MED ORDER — RIVASTIGMINE TARTRATE 4.5 MG PO CAPS: 4.5000 mg | ORAL_CAPSULE | Freq: Two times a day (BID) | ORAL | Status: DC

## 2015-09-13 NOTE — Telephone Encounter (Signed)
Kara MeadEmma, can you please order Harriette BouillonBayado for patient? See my last office ntoe or discuss with me for the specific services required. Thank you

## 2015-09-13 NOTE — Patient Instructions (Signed)
Remember to drink plenty of fluid, eat healthy meals and do not skip any meals. Try to eat protein with a every meal and eat a healthy snack such as fruit or nuts in between meals. Try to keep a regular sleep-wake schedule and try to exercise daily, particularly in the form of walking, 20-30 minutes a day, if you can.   As far as your medications are concerned, I would like to suggest: Increase Rivastigmine as discussed  I would like to see you back in 4 months, sooner if we need to. Please call us with any interim questions, concerns, problems, updates or refill requests.   Our phone number is [phone]. We also have an after hours call service for urgent matters and there is a physician on-call for urgent questions. For any emergencies you know to call 911 or go to the nearest emergency room

## 2015-09-13 NOTE — Progress Notes (Signed)
[facility]    Provider:  Dr Lucia Gaskins Referring Provider: [name], MD Primary Care Physician:  Minda Meo, MD  CC: Parkinson's Disease Dementia  Interval history 09/13/2015: Wesley Pierce is a 81 y.o. male here as a follow up. He has Parkinson's with good response to Sinemet, dementia, RLS, B12 deficiency. The Seroquel has helped. Fewer nightmares.  Wandering at night is not as bad. He is upset his license has been taken away. He wants to drive. It is a hardship on the family. He has wandered outside. He was episodes of confusion. The family took away his keys and the keys to the house. There were some times in September he did not know who his son was. This is better. He has had some falls however. No dysphagia. Wife and son provide most of the information. Overall they are thrilled with his improvement. They also Have started an anti-depression medication which has helped. Sinemet helps with his tremor. Memory is stable. He tried Nuplazid but symptoms worsened. He is back on requip at night for his RLS, stopping it did not improve his confusional episodes at night and only worsened his RLS symptoms and periodic limb movements of sleep. He has had a sleep study and prolonged EEG as well. I am glad the seroquel has been helpful, this can be further titrated if needed. Have discussed the side effects and black box warnings in the elderly for risk of increased mortality. He is on Rivastigmine. Will increase today and consider Namenda at a later time.    Interval history 10/14/102016: Wesley Pierce is a 81 y.o. male here as a follow up. He has Parkinson's with good response to Sinemet, dementia, RLS, B12 deficiency.He is having confusional episodes at night. Klonopin and melationin worsened symptoms, Nuplazid did not work. He was diagnosed with very mild sleep study. A 3-day VEEG did not show seizre activity. There was no significant oxygen desaturation only 8.7  minutes of the whole night were spent with desaturated settings. Periodic limb movements were frequent and arousals from periodic limb movements were 5 per hour. The majority of arousals was from spontaneous arousals. His requip was decreased at night to see if this was causing the confusion however he did not do well and it had to be increased again. He destroyed a lampshade in the den this past week. He has confusional episodes, sleepy during the day.   Had a long talk with patient, his wife and son who was with him. He is confusional episodes are likely due to Parkinson's disease and Parkinson's disease dementia. Dr. Dyanne Iha started Seroquel at night and I did encourage them to try it. I tried to refer them to Dr. Alphonzo Lemmings was a geriatric psychiatrist however he did not take their insurance, recommended possibly seeing Dr. Suzie Portela who is also a geriatric psychiatrist. I do feel that Seroquel is a good choice and this can be titrated as well.  Interval history 05/24/2015 : Wesley Pierce is a 81 y.o. male here as a follow up. He has Parkinson's with good response to Sinemet, dementia, RLS, B12 deficiency. He continues to sleep walk and recently had a sleep study. He is here to discuss his sleep study. However he does has a follow up with Dr. Vickey Huger on the 1st of September. I have explained to him that following up with Dr. Vickey Huger for sleep is appropriate but I can give him an initial review. He continues to sleep walk. The other night he  urinated on his couch in the middle of the night. Results showed that he was talking to himself and pulling out his lines during wakefullness, there was no REM sleep to evaluate for REM sleep disorder, EEG montage did not show seizures. Sleep study recommendation was to decrease any medications that could cause confusion, he only takes neurontin at night. Can trial stopping that but doubtful this is the cause. May consider an ambulatory eeg.   Interval history  03/04/2015: He continues to significant persistent episodes of sleep walking and confusion at night. Last night they went to bed at 10pm. He woke up last night at 1:30am. He was standing at the sink. He said he was helping people build a pond. He talks back to his wife but he does not remember these episodes. He urinated on the side of the bed. She got him back to bed and he got up again due to RLS, he doesn't remember all of it. He went to the refrigerator and she followed him down. He didn' t know where the fridge was. He remembers getting water but doesn't know why he was so confused. He tosses and tumbles, he doesn't usually remembr the episodes at night. He does around 2 or 4am not knowing which one was the hot or cold spicket. He can't find the door knob at night. He remembers some episodes but others he does not. The wife has gotten him out of the closet many times when he gets lost at night. He can't get out of the closet.   Thought possibly this was REM sleep disorder but clonazepam made it worse. He sas tried melatonin which also made things worse. The melatonin and clonazepam have made things worse. But these episodes started even before he tried medication. Unclear if this is a medication effect or not. He took [place] and destroyed the lamps one night.   Interval update 11/03/2014:  Patient is sleepwalking. He wakes up and moves. For example, he told his wife he was looking for a lantern. A lot of times he remembers sometimes he doesnt. Usually wife will ask a question and he can give her an answer. Sometimes there is a little agitation, may hit the bed. Agitation is rare, mostly confusional wandering. Last night he was in the kitchen looking for water.   His feet are hurting. The bottoms are tingly. He has to stand up and walk around. He tries to sit down and read which helps some. The bottom of his right foot feels sore. It has been going on for 5 years.   Initial visit 08/03/2014: Wesley Pierce is a 81 y.o. male here as a referral from Dr. Jacky Kindle for Parkinson's disease, RLS and memory changes. He is 81 years old and is a former patient of Dr. Hosie Poisson and is transitioning to me. He was found to have B12 deficiency and started on B12 injections. At last appointment was starte don low-dose Sinemet and titrated to one pill three times daily.  Wife provides most history and updates. Things are gong very well. He is a "whole different person" when he is on the medicine. Can see a significant change and can tell when it wears off. They are taking the medications at 8:30am and is taking it 3x a day a whole pill. Tremor is improving and helps significantly. Still taking the injections. He has some sleeping problems. He takes Ropinerole and gabapentin at night. Usually he sleeps well when he gets to sleep. But he  has some difficulty initiating sleep.   Currently taking Neurontin and Requip at bedtime for possible RLS. Is now currently taking Requip 1.5mg  and Gabapentin 300mg  nightly. Tried Gabapentin 600mg  and had hallucinations. Had tried higher dose of Requip but also suffered from hallucinations.   Has had a few TIAs in the past. No strokes. Otherwise healthy.   No family history of parkinson's or other neurodegenerative disorders   Basic lab work, including TSH, reviewed from PCP and was unremarkable.    Social History   Social History  . Marital Status: Married    Spouse Name: [name]  . Number of Children: 3  . Years of Education: AA degree   Occupational History  . retired from [employer]    Social History Main Topics  . Smoking status: Never Smoker   . Smokeless tobacco: Never Used  . Alcohol Use: No  . Drug Use: No  . Sexual Activity: Not on file   Other Topics Concern  . Not on file   Social History Narrative   Patient is married to [name]), has 3 children    Patient is right handed   Education is AA degree   Caffeine consumption is 1 cup daily    Family History    Problem Relation Age of Onset  . Allergies Brother   . Heart disease Sister   . Stroke Sister   . Clotting disorder Sister   . Alzheimer's disease Father   . Dementia Father     Past Medical History  Diagnosis Date  . Osteoarthritis   . Hyperlipidemia   . GERD (gastroesophageal reflux disease)   . Restless legs syndrome (RLS)   . Interstitial lung disease (HCC)   . Pleural thickening   . Hemorrhoids   . Cognitive decline   . Parkinson disease (HCC)   . B12 deficiency   . Memory loss   . Non-rapid eye movement sleep arousal disorder, sleep walking type 04/12/2015    Past Surgical History  Procedure Laterality Date  . Cataract extraction  03/2008  . Lung decortication  1962  . Colonoscopy    . Hemorrhoid surgery  06/21/2012    Procedure: HEMORRHOIDECTOMY;  Surgeon: Clovis Pu. Cornett, MD;  Location: [facility];  Service: General;  Laterality: N/A;  . Examination under anesthesia  08/27/2012    Procedure: EXAM UNDER ANESTHESIA;  Surgeon: Maisie Fus A. Cornett, MD;  Location: [facility];  Service: General;  Laterality: N/A;    Current Outpatient Prescriptions  Medication Sig Dispense Refill  . acetaminophen (TYLENOL) 325 MG tablet Take 650 mg by mouth as needed.      Marland Kitchen aspirin 81 MG tablet Take 81 mg by mouth daily.      . carbidopa-levodopa (SINEMET IR) 25-100 MG per tablet Take 1 tablet by mouth 3 (three) times daily. 90 tablet 11  . ergocalciferol (VITAMIN D2) 50000 UNITS capsule Take 50,000 Units by mouth once a week.      . fluticasone (FLONASE) 50 MCG/ACT nasal spray 2 sprays by Nasal route daily.      . formoterol (FORADIL AEROLIZER) 12 MCG capsule for inhaler place 1 capsule INTO INHALER AND INHALE TWICE DAILY 60 capsule 6  . gabapentin (NEURONTIN) 300 MG capsule Take 300 mg by mouth at bedtime.    Marland Kitchen guaiFENesin (MUCINEX) 600 MG 12 hr tablet Take 600 mg by mouth as needed.     . Polyethylene Glycol 3350 GRAN by Does not apply route.    Marland Kitchen  QUEtiapine (SEROQUEL)  25 MG tablet Take 2 tablets (50 mg total) by mouth at bedtime. 60 tablet 11  . RAPAFLO 8 MG CAPS capsule     . rOPINIRole (REQUIP) 1 MG tablet Take 1.5 tablets (1.5 mg total) by mouth at bedtime. Takes 1 1/2 tab QHS 45 tablet 5  . salmeterol (SEREVENT) 50 MCG/DOSE diskus inhaler Inhale 1 puff into the lungs 2 (two) times daily. 1 Inhaler 5  . vitamin B-12 (CYANOCOBALAMIN) 1000 MCG tablet Take 1,000 mcg by mouth daily.    . Vortioxetine HBr (TRINTELLIX) 10 MG TABS Take 10 mg by mouth at bedtime.    . rivastigmine (EXELON) 4.5 MG capsule Take 1 capsule (4.5 mg total) by mouth 2 (two) times daily. 30 capsule 11   No current facility-administered medications for this visit.    Allergies as of 09/13/2015 - Review Complete 09/13/2015  Allergen Reaction Noted  . Advair diskus [fluticasone-salmeterol]  05/16/2012  . Aloe Swelling 07/22/2012  . Ambien [zolpidem tartrate] Other (See Comments) 05/01/2012  . Halcion [triazolam]  08/03/2014  . Horizant [gabapentin]  08/03/2014  . Lyrica [pregabalin] Other (See Comments) 05/01/2012  . Sulfonamide derivatives    . Ciprofloxacin Rash     Vitals: BP 123/64 mmHg  Pulse 62  Ht  (1.676 m)  Wt 157 lb 3.2 oz (71.305 kg)  BMI 25.38 kg/m2 Last Weight:  Wt Readings from Last 1 Encounters:  09/13/15 157 lb 3.2 oz (71.305 kg)   Last Height:   Ht Readings from Last 1 Encounters:  09/13/15  (1.676 m)    Montreal Cognitive Assessment  07/16/2015  Visuospatial/ Executive (0/5) 0  Naming (0/3) 3  Attention: Read list of digits (0/2) 1  Attention: Read list of letters (0/1) 1  Attention: Serial 7 subtraction starting at 100 (0/3) 1  Language: Repeat phrase (0/2) 1  Language : Fluency (0/1) 1  Abstraction (0/2) 2  Delayed Recall (0/5) 4  Orientation (0/6) 4  Total 18  Adjusted Score (based on education) 18    Coordination: intermittent rest tremor noted in right hand (including with distraction) improved with  Sinemet, mild postural tremor L>RUE, mild bradykinesia with finger tapping bilateral, normal foot taps bilat.  Gait:  stands without assistance, upright, slight shuffling of steps, normal arm swing, negative Romberg, negative pull test  Tone:  Increased on the right with facilitation     Assessment/Plan: AVEER BARTOW is a 81 y.o. male here as a follow up. He has Parkinson's with good response to Sinemet, dementia, RLS, B12 deficiency.He was having confusional episodes at night. Klonopin and melationin worsened symptoms, Nuplazid did not work, Seroquel [place] helping quite a bit. He was diagnosed with very mild sleep apnea  There was no significant oxygen desaturation only 8.7 minutes of the whole night were spent with desaturated settings. Periodic limb movements were frequent and arousals from periodic limb movements were 5 per hour. The majority of arousals was from spontaneous arousals. His requip was decreased at night to see if this was causing the confusion however he did not do well and it had to be increased again. A 3-day VEEG did not show seizre activity.   Confusion at night likely due to his Parkinson's disease and Parkinson's disease dementia. Dr. Vickey Huger started Seroquel which I think is a good choice. Continue Sinemet at current dose. We can increase the Seroquel as needed at night. Did recommend following with a geriatric psychiatrist if this does not work. He was started on SSRI for his depression  which is helping with mood.   Increase Rivastigmine to 4.5mg  twice a day. Can consider adding namenda after we titrate the Rivastigmine to the highest dose. Consinue Seroquel. Continue Sinemet. Follow up with psychiatry, continue antidepressant as it appears to be helping. Will request [facility] in-home services: Patient needs  PT at home for gait and safety due to falls, also should heabe a home safety inspection, nursing support and counseling on his PD and dementia.  Advised them  to inquire if Medicare wil pay for home help or to contract [facility]. Caretaker burnout can be significant and wife and son need to take care of themselves as well.     Naomie DeanAntonia Ahern, MD  [facility] [address] [place], [place] [id number]  A total of 45 minutes was spent in with this patient and his family face-to-face. Over half this time was spent on counseling patient on the Parkinson's disease dementia diagnosis and different therapeutic options available.

## 2015-09-14 NOTE — Telephone Encounter (Signed)
[facility] Lucia GaskinsAhern request. Sent message thru [employer] to Suezanne CheshireEdwinna Swan [facility]) that referral is placed.

## 2015-09-15 ENCOUNTER — Telehealth: Payer: Self-pay | Admitting: Neurology

## 2015-09-15 NOTE — Telephone Encounter (Signed)
Called pt son back. Ok per [employer]. Advised per Dr Lucia GaskinsAhern that he stop 4.5mg  and decrease back down to 1.5mg . Told him to call us if sx do not get better. He verbalized understanding.

## 2015-09-15 NOTE — Telephone Encounter (Signed)
[name] [phone] called to advise after recent increase in rivastigmine (EXELON) 4.5 MG capsule, patient has been vomiting with diarrhea ever since.

## 2015-09-20 ENCOUNTER — Other Ambulatory Visit: Payer: Self-pay

## 2015-09-20 MED ORDER — RIVASTIGMINE TARTRATE 4.5 MG PO CAPS: 4.5000 mg | ORAL_CAPSULE | Freq: Two times a day (BID) | ORAL | Status: DC

## 2015-09-20 NOTE — Telephone Encounter (Signed)
Pharmacy requests #[phone]

## 2015-09-21 ENCOUNTER — Other Ambulatory Visit: Payer: Self-pay | Admitting: Neurology

## 2015-09-22 ENCOUNTER — Other Ambulatory Visit: Payer: Self-pay | Admitting: Neurology

## 2015-09-28 ENCOUNTER — Other Ambulatory Visit: Payer: Self-pay

## 2015-09-28 MED ORDER — RIVASTIGMINE TARTRATE 1.5 MG PO CAPS: 1.5000 mg | ORAL_CAPSULE | Freq: Two times a day (BID) | ORAL | Status: DC

## 2015-09-28 NOTE — Telephone Encounter (Addendum)
Pts wife called and says that they need to have a new rx for [employer] to reflect the decrease to 1.5 mg from 4.5mg . Please call and advise [phone]. Please use [facility]

## 2015-09-28 NOTE — Telephone Encounter (Signed)
Rx has been updated and sent per instruction.  Receipt confirmed by pharmacy.  I called back to advise.  They expressed understanding an appreciation.

## 2015-09-30 ENCOUNTER — Encounter: Payer: Self-pay | Admitting: *Deleted

## 2015-09-30 NOTE — Progress Notes (Signed)
[facility]. Received confirmation. Sent copies to medical records.

## 2015-10-08 ENCOUNTER — Ambulatory Visit (INDEPENDENT_AMBULATORY_CARE_PROVIDER_SITE_OTHER): Payer: Medicare Other | Admitting: Psychiatry

## 2015-10-08 DIAGNOSIS — G20 Parkinson's disease: Principal | ICD-10-CM

## 2015-10-08 DIAGNOSIS — F0281 Dementia in other diseases classified elsewhere with behavioral disturbance: Secondary | ICD-10-CM

## 2015-10-08 NOTE — Progress Notes (Signed)
Psychiatric Initial Adult Assessment   Patient Identification: Wesley Pierce MRN:  [id number] Date of Evaluation:  10/08/2015 Referral Source: Dr. Daisy Blossom neurologist at [facility] Chief Complaint:   Visit Diagnosis Parkinson's disease dementia Diagnosis:  Parkinson's disease dementia This patient is a 82 year old white married father is diagnosed with Parkinson's and is being asked to have a psychiatric evaluation most likely due the question of the use of Seroquel. The patient's Parkinson's symptoms actually at this time seem to be minimal. All we did no formal testing I suspect he does have a dementia process evident by the fact that he is no longer doing his medications and that his providers have her strict him from driving. Today the patient was seen with his wife and and his son [name]. The patient is been married for 62 years and is in a stable marriage. The patient has 3 adult children and 4 grandchildren all of whom are doing well. The patient has been retired from [employer] for many years. Financially he is stable. Unfortunately recently had a death of his sister in the last month. This patient denies daily depression. He is sleeping and eating well. In particular problem in September where he experienced insomnia and significant auditory and visual hallucinations during the day. There is some questions about his Requip dose but according to the family once started on Seroquel at low dose he began sleep and no longer experienced hallucinations. The patient is never been violent or agitated. The patient still enjoys things. He would like to do more would work. He reads and watches TV. He is a good sense of worth. He denies being suicidal now and has never made a suicide attempt. He is not suspicious. He is not paranoid. The patient denies the use of alcohol. At this time he denies any auditory or visual hallucinations. He denies any clear episode of major depression or of  mania. He denies specific symptoms consistent with generalized anxiety disorder, panic disorder or obsessive-compulsive disorder. The patient medically is actually quite healthy. His only medical condition seems to be that of restless leg syndrome and Parkinson's. The patient's past psychiatric history is negative. He's never been in a psychiatric hospital and never been evaluated by a psychiatrist. The only psychiatric medication he's been on his the Seroquel that he takes now. It seems to work well and he is not oversedated. The patient and his family seem to be pleased with the benefits of the Seroquel. There are no side effects from this agent at this dose. Overall they believe that his health eliminated his hallucinations and is helping her sleep. Patient is calm and cooperative and compliant. Patient Active Problem List   Diagnosis Date Noted  . Dementia due to Parkinson's disease with behavioral disturbance (HCC) [G20, F02.81] 09/13/2015  . Parkinson's disease dementia (HCC) [G20, F02.80] 07/18/2015  . Non-rapid eye movement sleep arousal disorder, sleep walking type [F51.3] 04/12/2015  . RLS (restless legs syndrome) [G25.81] 03/05/2015  . B12 deficiency [E53.8] 03/05/2015  . Hereditary and idiopathic peripheral neuropathy [G60.9] 03/05/2015  . Parkinson disease (HCC) [G20] 03/05/2015  . Parasomnia [G47.50] 03/04/2015  . Parkinsonism (HCC) [G20] 08/03/2014  . Post-operative state [Z98.890] 09/16/2012  . Acute sinusitis [J01.90] 08/16/2012  . Anal stenosis [K62.4] 08/12/2012  . Acute bronchitis [J20.9] 05/01/2012  . Hemorrhoids [K64.9] 03/08/2012  . Hemorrhoids [455] 02/05/2012  . COPD (chronic obstructive pulmonary disease) (HCC) [J44.9] 05/29/2011  . PERSISTENT DISORDER INITIATING/MAINTAINING SLEEP [G47.00] 08/30/2009  . UNSPEC ALVEOLAR&PARIETOALVEOLAR PNEUMONOPATHY [J84.09] 06/02/2009  .  HYPERLIPIDEMIA [E78.5] 06/01/2009  . RESTLESS LEG SYNDROME [G25.81] 06/01/2009  . GERD [K21.9]  06/01/2009  . OSTEOARTHRITIS [M19.90] 06/01/2009   History of Present Illness:   Elements:   Associated Signs/Symptoms: Depression Symptoms:   (Hypo) Manic Symptoms:   Anxiety Symptoms:   Psychotic Symptoms:  None PTSD Symptoms: NA  Past Medical History:  Past Medical History  Diagnosis Date  . Osteoarthritis   . Hyperlipidemia   . GERD (gastroesophageal reflux disease)   . Restless legs syndrome (RLS)   . Interstitial lung disease (HCC)   . Pleural thickening   . Hemorrhoids   . Cognitive decline   . Parkinson disease (HCC)   . B12 deficiency   . Memory loss   . Non-rapid eye movement sleep arousal disorder, sleep walking type 04/12/2015    Past Surgical History  Procedure Laterality Date  . Cataract extraction  03/2008  . Lung decortication  1962  . Colonoscopy    . Hemorrhoid surgery  06/21/2012    Procedure: HEMORRHOIDECTOMY;  Surgeon: Clovis Pu. Cornett, MD;  Location: [facility];  Service: General;  Laterality: N/A;  . Examination under anesthesia  08/27/2012    Procedure: EXAM UNDER ANESTHESIA;  Surgeon: Maisie Fus A. Cornett, MD;  Location: [facility];  Service: General;  Laterality: N/A;   Family History:  Family History  Problem Relation Age of Onset  . Allergies Brother   . Heart disease Sister   . Stroke Sister   . Clotting disorder Sister   . Alzheimer's disease Father   . Dementia Father    Social History:   Social History   Social History  . Marital Status: Married    Spouse Name: [name]  . Number of Children: 3  . Years of Education: AA degree   Occupational History  . retired from [employer]    Social History Main Topics  . Smoking status: Never Smoker   . Smokeless tobacco: Never Used  . Alcohol Use: No  . Drug Use: No  . Sexual Activity: Not on file   Other Topics Concern  . Not on file   Social History Narrative   Patient is married to [name]), has 3 children    Patient is right handed   Education is AA degree    Caffeine consumption is 1 cup daily   Additional Social History:   Musculoskeletal: Strength & Muscle Tone: within normal limits Gait & Station: broad based Patient leans: No leaning  Psychiatric Specialty Exam: HPI  ROS  There were no vitals taken for this visit.There is no weight on file to calculate BMI.  General Appearance: Fairly Groomed  Eye Contact: Good   Speech:  Clear and Coherent  Volume:  Normal  Mood:  Euthymic  Affect:  Appropriate  Thought Process:  Coherent  Orientation:  Full (Time, Place, and Person)  Thought Content:  WDL  Suicidal Thoughts:  No  Homicidal Thoughts:  No  Memory:  NA  Judgement:  Good  Insight:  Fair  Psychomotor Activity:  Normal  Concentration:  Good  Recall:  Good  Fund of Knowledge:Good  Language: Good  Akathisia:  No  Handed:  Right  AIMS (if indicated):    Assets:  Desire for Improvement  ADL's:  Intact  Cognition: WNL  Sleep:      Is the patient at risk to self?  No. Has the patient been a risk to self in the past 6 months?  No. Has the patient been a risk to  self within the distant past?  No. Is the patient a risk to others?  No. Has the patient been a risk to others in the past 6 months?  No. Has the patient been a risk to others within the distant past?  No.  Allergies:   Allergies  Allergen Reactions  . Advair Diskus [Fluticasone-Salmeterol]     anxiety  . Aloe Swelling  . Ambien [Zolpidem Tartrate] Other (See Comments)    Sleep walking  . Halcion [Triazolam]     Sleep Walking  . Horizant [Gabapentin]     Sleep walking and halucinations  . Lyrica [Pregabalin] Other (See Comments)    Sleep walking   . Sulfonamide Derivatives     Pt unable to remember  . Ciprofloxacin Rash   Current Medications: Current Outpatient Prescriptions  Medication Sig Dispense Refill  . acetaminophen (TYLENOL) 325 MG tablet Take 650 mg by mouth as needed.      Marland Kitchen aspirin 81 MG tablet Take 81 mg by mouth daily.      .  carbidopa-levodopa (SINEMET IR) 25-100 MG per tablet Take 1 tablet by mouth 3 (three) times daily. 90 tablet 11  . ergocalciferol (VITAMIN D2) 50000 UNITS capsule Take 50,000 Units by mouth once a week.      . fluticasone (FLONASE) 50 MCG/ACT nasal spray 2 sprays by Nasal route daily.      . formoterol (FORADIL AEROLIZER) 12 MCG capsule for inhaler place 1 capsule INTO INHALER AND INHALE TWICE DAILY 60 capsule 6  . gabapentin (NEURONTIN) 300 MG capsule Take 300 mg by mouth at bedtime.    Marland Kitchen guaiFENesin (MUCINEX) 600 MG 12 hr tablet Take 600 mg by mouth as needed.     . Polyethylene Glycol 3350 GRAN by Does not apply route.    Marland Kitchen QUEtiapine (SEROQUEL) 25 MG tablet Take 2 tablets (50 mg total) by mouth at bedtime. 60 tablet 11  . RAPAFLO 8 MG CAPS capsule     . rivastigmine (EXELON) 1.5 MG capsule Take 1 capsule (1.5 mg total) by mouth 2 (two) times daily. 60 capsule 6  . rOPINIRole (REQUIP) 1 MG tablet Take 1.5 tablets (1.5 mg total) by mouth at bedtime. Takes 1 1/2 tab QHS 45 tablet 5  . salmeterol (SEREVENT) 50 MCG/DOSE diskus inhaler Inhale 1 puff into the lungs 2 (two) times daily. 1 Inhaler 5  . vitamin B-12 (CYANOCOBALAMIN) 1000 MCG tablet Take 1,000 mcg by mouth daily.    . Vortioxetine HBr (TRINTELLIX) 10 MG TABS Take 10 mg by mouth at bedtime.     No current facility-administered medications for this visit.    Previous Psychotropic Medications: Seroquel 25 mg  Substance Abuse History in the last 12 months:  No.  Consequences of Substance Abuse: Negative  Medical Decision Making:  Self-Limited or Minor (1)  Treatment Plan Summary: At this time I believe this patient is doing well. While it is true there is a different medicine available for hallucinations and Parkinson-like conditions I think at this time Seroquel services purposes. By getting a good night sleep and possibly reducing psychosis as well it is ideal for this patient. He is not having any negatives of Seroquel. He does  not have diabetes or hypercholesterolemia. He's not fallen. Therefore his no complications from the use of Seroquel it only seems to been helpful. The patient has no evidence of mood disorder. He is not agitated. In September he was particularly disturbed by hallucinations and poor sleep that has abated at this time.  I think the use of Seroquel was helpful and I would continue it without a problem. The risk of tardive dyskinesia is extremely small with this agent. This patient will be given a follow-up appointment to see me in 5 months but at this time I would not change the Seroquel.    Koki Buxton IRVING 1/6/201711:45 AM

## 2015-10-19 ENCOUNTER — Encounter: Payer: Self-pay | Admitting: *Deleted

## 2015-10-19 NOTE — Progress Notes (Signed)
Faxed signed form back to [facility]. Form dated 10/04/15. Received confirmation. Fax: [phone]. Sent copy to MR.

## 2015-11-01 ENCOUNTER — Telehealth: Payer: Self-pay | Admitting: Neurology

## 2015-11-01 NOTE — Telephone Encounter (Signed)
MaryAnn Hawkey called to advise husband was seeing NP at [facility] however was out of network and will now be seen by Dr. Donell Beers, states NP prescribed anti-depressant Trintellix that is working well for patient and wonders if Dr. Lucia Gaskins could prescribe Trintellix in the interim. Patient was last seen by NP in December. Advised to call NP/Parrish McKinney's office to see if they can't continue to prescribe until husband is seen by Dr. Donell Beers and if there is a problem with their office prescribing, then call our office back.

## 2015-11-09 ENCOUNTER — Encounter: Payer: Self-pay | Admitting: Emergency Medicine

## 2015-11-09 ENCOUNTER — Ambulatory Visit (INDEPENDENT_AMBULATORY_CARE_PROVIDER_SITE_OTHER): Payer: Medicare Other | Admitting: Emergency Medicine

## 2015-11-09 VITALS — BP 120/64 | HR 64 | Ht 66.0 in | Wt 154.0 lb

## 2015-11-09 DIAGNOSIS — G2581 Restless legs syndrome: Secondary | ICD-10-CM | POA: Diagnosis not present

## 2015-11-09 DIAGNOSIS — J439 Emphysema, unspecified: Secondary | ICD-10-CM | POA: Diagnosis not present

## 2015-11-09 NOTE — Assessment & Plan Note (Signed)
Please continue your Serevent twice a day.  We will give you Pro-Air to use 2 puffs if needed for shortness of breath.  Follow with Dr Delton Coombes in 12 months or sooner if you have any problems

## 2015-11-09 NOTE — Patient Instructions (Signed)
Please continue your Serevent twice a day.  We will give you Pro-Air to use 2 puffs if needed for shortness of breath.  Continue your other medications as directed by Dr Lucia Gaskins.  Follow with Dr Delton Coombes in 12 months or sooner if you have any problems

## 2015-11-09 NOTE — Assessment & Plan Note (Signed)
Inactive at this time. 

## 2015-11-09 NOTE — Assessment & Plan Note (Signed)
Continue current regimen

## 2015-11-09 NOTE — Progress Notes (Signed)
   Subjective:    Patient ID: Wesley Pierce, male    DOB: July 28, 1931, 82 y.o.   MRN: [id number]  HPI 82 year old gentleman who follows up for COPD, some chronic right basilar scar and restless leg syndrome. Also with a hx of a remote L decortication, ? Due to empyema. He has been followed by Dr. Shelle Iron. He also sees neurology for vitamin B-12 deficiency and early parkinson's. He is currently managed on Foradil, only taking it qd right now. He forgets it, also the insurance cost has been problematic. His restless leg syndrome is treated with Requip 1.5 mg daily at bedtime.  He describes stable breathing, good exercise tolerance. He is able to sing in chorus.  His restless legs are active - he just had a repeat PSG last week. Also Sleep walking is a concern, ? Related to degree of his medications, gabapentin, etc. History today is taken from the patient and from his wife.   ROV 11/09/15 -- patient with a history of COPD. He also has restless leg syndrome and nocturnal sleep ambulation has been followed by Dr. Vickey Huger.  He is much improved since starting seroquel. Also on rivastigmine, sinemet, requip.  Last time we had to replace foradil to serevent discus. Hasn't seemed to lose any ground. He may have a bit more cough. He can walk around the block, but is unable to jog.    Review of Systems As per HPI     Objective:   Physical Exam Filed Vitals:   11/09/15 1335  BP: 120/64  Pulse: 64  Height:  (1.676 m)  Weight: 154 lb (69.854 kg)  SpO2: 95%   Gen: Pleasant, well-nourished, in no distress,  normal affect  ENT: No lesions,  mouth clear,  oropharynx clear, no postnasal drip  Neck: No JVD, no TMG, no carotid bruits  Lungs: No use of accessory muscles, clear on the left, inspiratory right basilar crackles, no wheezes  Cardiovascular: RRR, heart sounds normal, no murmur or gallops, no peripheral edema  Musculoskeletal: No deformities, no cyanosis or clubbing  Neuro: alert, non  focal, some forgetfulness and difficulty with short-term memory  Skin: Warm, no lesions or rashes      Assessment & Plan:  COPD (chronic obstructive pulmonary disease) Please continue your Serevent twice a day.  We will give you Pro-Air to use 2 puffs if needed for shortness of breath.  Follow with Dr Delton Coombes in 12 months or sooner if you have any problems  UNSPEC ALVEOLAR&PARIETOALVEOLAR PNEUMONOPATHY Inactive at this time.   RLS (restless legs syndrome) Continue current regimen

## 2015-11-15 ENCOUNTER — Telehealth: Payer: Self-pay | Admitting: Emergency Medicine

## 2015-11-15 MED ORDER — ALBUTEROL SULFATE HFA 108 (90 BASE) MCG/ACT IN AERS: 2.0000 | INHALATION_SPRAY | Freq: Four times a day (QID) | RESPIRATORY_TRACT | Status: DC | PRN

## 2015-11-15 NOTE — Telephone Encounter (Signed)
Called spoke with spouse. Pt was see by RB last week and no proair was called in. I have sent this in. Nothing further needed

## 2015-11-25 ENCOUNTER — Telehealth: Payer: Self-pay | Admitting: Neurology

## 2015-11-25 NOTE — Telephone Encounter (Signed)
Called wife back. Relayed message below per Dr Terrace Arabia. She stated she realized pt has only been taking seroquel 1 tablet at night because she got a little confused by the instruction the pharmacy gave her. She would like to try going back to 2 tablets at night first before increasing to 3 tablets.  Re-educated that pt needs to make sure he is taking the sinemet at least 4 hours apart. (9/12/4) and he can take an extra tablet before he goes to bed for his PLM. Also needs to separate dose from protein rich foods at least by 30-60 min. She verbalized understanding and appreciation.

## 2015-11-25 NOTE — Telephone Encounter (Signed)
Chart reviewed, Dr. Trevor Mace patient, last visit October 2016  He has Parkinson's with good response to Sinemet, dementia, RLS, B12 deficiency.He is having confusional episodes at night. Klonopin and melationin worsened symptoms, Nuplazid did not work. Abnormal sleep study. Periodic limb movements were frequent and arousals from periodic limb movements were 5 per hour. The majority of arousals was from spontaneous arousals.   He is confusional episodes are likely due to Parkinson's disease and Parkinson's disease dementia.   He is currently taking seroquel 25 mg 2 tablets every night  May suggested him increase seroquel to 25 mg 3 tablets every night before he goes to bed Stopped Requip, please also check the timing of his Sinemet 25/100 mg, may consider add one extra tablet before he goes to bed for his PLM

## 2015-11-25 NOTE — Telephone Encounter (Signed)
Pt's wife called and says that since last week she has seen a decline. Last night was the worst. Reminded her of 6 months ago. He was up all night pacing around the house. She wants to know about increasing his medication. Please call and advise [phone] Pt's wife says she may have found the reason , she was only giving him one dose not two of the Seroquel.

## 2015-12-07 ENCOUNTER — Other Ambulatory Visit: Payer: Self-pay | Admitting: Neurology

## 2015-12-28 ENCOUNTER — Other Ambulatory Visit: Payer: Self-pay | Admitting: Neurology

## 2015-12-29 ENCOUNTER — Telehealth (HOSPITAL_COMMUNITY): Payer: Self-pay

## 2015-12-29 MED ORDER — VORTIOXETINE HBR 10 MG PO TABS: 10.0000 mg | ORAL_TABLET | Freq: Every morning | ORAL | Status: AC

## 2015-12-29 NOTE — Telephone Encounter (Signed)
Yes  i  Called in his  Trintelix  Please call family to pick it up   thanks

## 2015-12-29 NOTE — Telephone Encounter (Signed)
Patients wife is calling she states that the psychiatrist they were seeing had her husband on Trintellix. I looked in the chart and it looks like he was on 10 mg at night. The former psychiatrist will not refill since patient is no longer his and the PCP would rather it came from you. Patients wife would like to know if they can get a new order. Please advise, thank you

## 2016-01-18 ENCOUNTER — Ambulatory Visit (INDEPENDENT_AMBULATORY_CARE_PROVIDER_SITE_OTHER): Payer: Medicare Other | Admitting: Neurology

## 2016-01-18 ENCOUNTER — Encounter: Payer: Self-pay | Admitting: Neurology

## 2016-01-18 VITALS — BP 116/68 | HR 61 | Ht 66.0 in | Wt 154.4 lb

## 2016-01-18 DIAGNOSIS — F513 Sleepwalking [somnambulism]: Secondary | ICD-10-CM

## 2016-01-18 DIAGNOSIS — F0391 Unspecified dementia with behavioral disturbance: Secondary | ICD-10-CM | POA: Diagnosis not present

## 2016-01-18 DIAGNOSIS — F29 Unspecified psychosis not due to a substance or known physiological condition: Secondary | ICD-10-CM | POA: Diagnosis not present

## 2016-01-18 DIAGNOSIS — F03918 Unspecified dementia, unspecified severity, with other behavioral disturbance: Secondary | ICD-10-CM

## 2016-01-18 MED ORDER — RIVASTIGMINE 4.6 MG/24HR TD PT24: 4.6000 mg | MEDICATED_PATCH | Freq: Every day | TRANSDERMAL | Status: DC

## 2016-01-18 MED ORDER — QUETIAPINE FUMARATE 25 MG PO TABS: 75.0000 mg | ORAL_TABLET | Freq: Every day | ORAL | Status: DC

## 2016-01-18 NOTE — Patient Instructions (Signed)
Remember to drink plenty of fluid, eat healthy meals and do not skip any meals. Try to eat protein with a every meal and eat a healthy snack such as fruit or nuts in between meals. Try to keep a regular sleep-wake schedule and try to exercise daily, particularly in the form of walking, 20-30 minutes a day, if you can.   As far as your medications are concerned, I would like to suggest; Stop the Rivastigmine/Exelon by mouth. Instead use one patch daily. Discard and replace daily. Increase the Seroquel to 75mg  at night (3 pills).  I would like to see you back in 6 months, sooner if we need to. Please call us with any interim questions, concerns, problems, updates or refill requests.   Our phone number is [phone]. We also have an after hours call service for urgent matters and there is a physician on-call for urgent questions. For any emergencies you know to call 911 or go to the nearest emergency room

## 2016-01-18 NOTE — Progress Notes (Signed)
[facility]    Provider:  Dr Lucia Gaskins Referring Provider: [name], MD Primary Care Physician:  Minda Meo, MD   CC: Parkinson's Disease Dementia  Interval history 01/18/2016: Wesley Pierce is a 82 y.o. male here as a follow up. He has Parkinson's disease dementia with good response to Sinemet and seroquel, dementia, RLS, B12 deficiency. The Seroquel has helped. Fewer nightmares. Wandering at night is not as bad. He feels like he should be driving. Discussed again, family has taken away keys and also agrees he should not be driving. He is slowly regressing at night. He has started wandering more though. Not taking naps. He goes to bed at 10pm and wakes randomly and wanders. He had nausea with the rivastigmine when tried to increase above 1.5mg  bid. Can't increase oral increase. No dysphagia. No falls. Sinemet helps with his tremor. Memory is stable. He tried Nuplazid but symptoms worsened. He is back on requip at night for his RLS, stopping it did not improve his confusional episodes at night and only worsened his RLS symptoms and periodic limb movements of sleep. He has had a sleep study and prolonged EEG as well. I am glad the seroquel has been helpful, this can be further titrated if needed. Have discussed the side effects and black box warnings in the elderly for risk of increased mortality. Consider Namenda at a later time.   Interval history 09/13/2015: Wesley Pierce is a 82 y.o. male here as a follow up. He has Parkinson's with good response to Sinemet, dementia, RLS, B12 deficiency. The Seroquel has helped. Fewer nightmares. Wandering at night is not as bad. He is upset his license has been taken away. He wants to drive. It is a hardship on the family. He has wandered outside. He was episodes of confusion. The family took away his keys and the keys to the house. There were some times in September he did not know who his son was. This is better. He has  had some falls however. No dysphagia. Wife and son provide most of the information. Overall they are thrilled with his improvement. They also Have started an anti-depression medication which has helped. Sinemet helps with his tremor. Memory is stable. He tried Nuplazid but symptoms worsened. He is back on requip at night for his RLS, stopping it did not improve his confusional episodes at night and only worsened his RLS symptoms and periodic limb movements of sleep. He has had a sleep study and prolonged EEG as well. I am glad the seroquel has been helpful, this can be further titrated if needed. Have discussed the side effects and black box warnings in the elderly for risk of increased mortality. He is on Rivastigmine. Will increase today and consider Namenda at a later time.    Interval history 10/14/102016: Wesley Pierce is a 82 y.o. male here as a follow up. He has Parkinson's with good response to Sinemet, dementia, RLS, B12 deficiency.He is having confusional episodes at night. Klonopin and melationin worsened symptoms, Nuplazid did not work. He was diagnosed with very mild sleep study. A 3-day VEEG did not show seizre activity. There was no significant oxygen desaturation only 8.7 minutes of the whole night were spent with desaturated settings. Periodic limb movements were frequent and arousals from periodic limb movements were 5 per hour. The majority of arousals was from spontaneous arousals. His requip was decreased at night to see if this was causing the confusion however he did not do well and  it had to be increased again. He destroyed a lampshade in the den this past week. He has confusional episodes, sleepy during the day.   Had a long talk with patient, his wife and son who was with him. He is confusional episodes are likely due to Parkinson's disease and Parkinson's disease dementia. Dr. Dyanne Ihaelmar started Seroquel at night and I did encourage them to try it. I tried to refer them to Dr. Alphonzo LemmingsGerald  Plovski was a geriatric psychiatrist however he did not take their insurance, recommended possibly seeing Dr. Suzie PortelaPayne who is also a geriatric psychiatrist. I do feel that Seroquel is a good choice and this can be titrated as well.  Interval history 05/24/2015 : Wesley Pierce is a 82 y.o. male here as a follow up. He has Parkinson's with good response to Sinemet, dementia, RLS, B12 deficiency. He continues to sleep walk and recently had a sleep study. He is here to discuss his sleep study. However he does has a follow up with Dr. [name] on the 1st of September. I have explained to him that following up with Dr. [name] for sleep is appropriate but I can give him an initial review. He continues to sleep walk. The other night he urinated on his couch in the middle of the night. Results showed that he was talking to himself and pulling out his lines during wakefullness, there was no REM sleep to evaluate for REM sleep disorder, EEG montage did not show seizures. Sleep study recommendation was to decrease any medications that could cause confusion, he only takes neurontin at night. Can trial stopping that but doubtful this is the cause. May consider an ambulatory eeg.   Interval history 03/04/2015: He continues to significant persistent episodes of sleep walking and confusion at night. Last night they went to bed at 10pm. He woke up last night at 1:30am. He was standing at the sink. He said he was helping people build a pond. He talks back to his wife but he does not remember these episodes. He urinated on the side of the bed. She got him back to bed and he got up again due to RLS, he doesn't remember all of it. He went to the refrigerator and she followed him down. He didn' t know where the fridge was. He remembers getting water but doesn't know why he was so confused. He tosses and tumbles, he doesn't usually remembr the episodes at night. He does around 2 or 4am not knowing which one was the hot or cold  spicket. He can't find the door knob at night. He remembers some episodes but others he does not. The wife has gotten him out of the closet many times when he gets lost at night. He can't get out of the closet.   Thought possibly this was REM sleep disorder but clonazepam made it worse. He sas tried melatonin which also made things worse. The melatonin and clonazepam have made things worse. But these episodes started even before he tried medication. Unclear if this is a medication effect or not. He took [place] and destroyed the lamps one night.   Interval update 11/03/2014:  Patient is sleepwalking. He wakes up and moves. For example, he told his wife he was looking for a lantern. A lot of times he remembers sometimes he doesnt. Usually wife will ask a question and he can give her an answer. Sometimes there is a little agitation, may hit the bed. Agitation is rare, mostly confusional wandering. Last night  he was in the kitchen looking for water.   His feet are hurting. The bottoms are tingly. He has to stand up and walk around. He tries to sit down and read which helps some. The bottom of his right foot feels sore. It has been going on for 5 years.   Initial visit 08/03/2014: Wesley Pierce is a 82 y.o. male here as a referral from Dr. Jacky Kindle for Parkinson's disease, RLS and memory changes. He is 82 years old and is a former patient of Dr. Hosie Poisson and is transitioning to me. He was found to have B12 deficiency and started on B12 injections. At last appointment was starte don low-dose Sinemet and titrated to one pill three times daily.  Wife provides most history and updates. Things are gong very well. He is a "whole different person" when he is on the medicine. Can see a significant change and can tell when it wears off. They are taking the medications at 8:30am and is taking it 3x a day a whole pill. Tremor is improving and helps significantly. Still taking the injections. He has some sleeping  problems. He takes Ropinerole and gabapentin at night. Usually he sleeps well when he gets to sleep. But he has some difficulty initiating sleep.   Currently taking Neurontin and Requip at bedtime for possible RLS. Is now currently taking Requip 1.5mg  and Gabapentin  nightly. Tried Gabapentin  and had hallucinations. Had tried higher dose of Requip but also suffered from hallucinations.   Has had a few TIAs in the past. No strokes. Otherwise healthy.   No family history of parkinson's or other neurodegenerative disorders   Basic lab work, including TSH, reviewed from PCP and was unremarkable.   Social History   Social History  . Marital Status: Married    Spouse Name: [name]  . Number of Children: 3  . Years of Education: AA degree   Occupational History  . retired from [employer]    Social History Main Topics  . Smoking status: Never Smoker   . Smokeless tobacco: Never Used  . Alcohol Use: No  . Drug Use: No  . Sexual Activity: Not on file   Other Topics Concern  . Not on file   Social History Narrative   Patient is married to [name]), has 3 children    Patient is right handed   Education is AA degree   Caffeine consumption is 1 cup daily    Family History  Problem Relation Age of Onset  . Allergies Brother   . Heart disease Sister   . Stroke Sister   . Clotting disorder Sister   . Alzheimer's disease Father   . Dementia Father     Past Medical History  Diagnosis Date  . Osteoarthritis   . Hyperlipidemia   . GERD (gastroesophageal reflux disease)   . Restless legs syndrome (RLS)   . Interstitial lung disease (HCC)   . Pleural thickening   . Hemorrhoids   . Cognitive decline   . Parkinson disease (HCC)   . B12 deficiency   . Memory loss   . Non-rapid eye movement sleep arousal disorder, sleep walking type 04/12/2015    Past Surgical History  Procedure Laterality Date  . Cataract extraction  03/2008  . Lung decortication  1962  . Colonoscopy    .  Hemorrhoid surgery  06/21/2012    Procedure: HEMORRHOIDECTOMY;  Surgeon: Clovis Pu. Cornett, MD;  Location: [facility];  Service: General;  Laterality: N/A;  .  Examination under anesthesia  08/27/2012    Procedure: EXAM UNDER ANESTHESIA;  Surgeon: Maisie Fus A. Cornett, MD;  Location:  SURGERY CENTER;  Service: General;  Laterality: N/A;    Current Outpatient Prescriptions  Medication Sig Dispense Refill  . acetaminophen (TYLENOL) 325 MG tablet Take 650 mg by mouth as needed.      Marland Kitchen albuterol (PROAIR HFA) 108 (90 Base) MCG/ACT inhaler Inhale 2 puffs into the lungs every 6 (six) hours as needed for wheezing or shortness of breath. 1 Inhaler 3  . aspirin 81 MG tablet Take 81 mg by mouth daily.      . carbidopa-levodopa (SINEMET IR) 25-100 MG tablet take 1 tablet by mouth three times a day 90 tablet 11  . ergocalciferol (VITAMIN D2) 50000 UNITS capsule Take 50,000 Units by mouth once a week.      . fluticasone (FLONASE) 50 MCG/ACT nasal spray 2 sprays by Nasal route daily.      Marland Kitchen gabapentin (NEURONTIN) 300 MG capsule Take 300 mg by mouth at bedtime.    Marland Kitchen guaiFENesin (MUCINEX) 600 MG 12 hr tablet Take 600 mg by mouth as needed.     . Polyethylene Glycol 3350 GRAN by Does not apply route.    Marland Kitchen QUEtiapine (SEROQUEL) 25 MG tablet Take 2 tablets (50 mg total) by mouth at bedtime. 60 tablet 11  . rivastigmine (EXELON) 1.5 MG capsule Take 1 capsule (1.5 mg total) by mouth 2 (two) times daily. 60 capsule 6  . rOPINIRole (REQUIP) 1 MG tablet Take 1.5 tablets (1.5 mg total) by mouth at bedtime. Takes 1 1/2 tab QHS 45 tablet 5  . salmeterol (SEREVENT) 50 MCG/DOSE diskus inhaler Inhale 1 puff into the lungs 2 (two) times daily. 1 Inhaler 5  . vitamin B-12 (CYANOCOBALAMIN) 1000 MCG tablet Take 1,000 mcg by mouth daily.    . Vortioxetine HBr (TRINTELLIX) 10 MG TABS Take 1 tablet (10 mg total) by mouth every morning. 30 tablet 4   No current facility-administered medications for this visit.      Allergies as of 01/18/2016 - Review Complete 01/18/2016  Allergen Reaction Noted  . Advair diskus [fluticasone-salmeterol]  05/16/2012  . Aloe Swelling 07/22/2012  . Ambien [zolpidem tartrate] Other (See Comments) 05/01/2012  . Halcion [triazolam]  08/03/2014  . Horizant [gabapentin]  08/03/2014  . Lyrica [pregabalin] Other (See Comments) 05/01/2012  . Sulfonamide derivatives    . Ciprofloxacin Rash     Vitals: BP 116/68 mmHg  Pulse 61  Ht 5\' 6"  (1.676 m)  Wt 154 lb 6.4 oz (70.035 kg)  BMI 24.93 kg/m2 Last Weight:  Wt Readings from Last 1 Encounters:  01/18/16 154 lb 6.4 oz (70.035 kg)   Last Height:   Ht Readings from Last 1 Encounters:  01/18/16 5\' 6"  (1.676 m)     Coordination: intermittent rest tremor noted in right hand (including with distraction) improved with Sinemet, mild postural tremor L>RUE, mild bradykinesia with finger tapping bilateral, normal foot taps bilat.  Gait:  stands without assistance, upright, very slight shuffling of steps, normal arm swing, negative Romberg, negative pull test  Tone:  Increased on the right with facilitation, normal on the left    Assessment/Plan: Wesley Pierce is a 82 y.o. male here as a follow up. He has Parkinson's with good response to Sinemet, dementia, RLS, B12 deficiency.He was having confusional episodes at night. Klonopin and melationin worsened symptoms, Nuplazid did not work, Seroquel helping quite a bit. He was diagnosed with very mild sleep apnea  There was no significant oxygen desaturation only 8.7 minutes of the whole night were spent with desaturated settings. Periodic limb movements were frequent and arousals from periodic limb movements were 5 per hour. The majority of arousals was from spontaneous arousals. His requip was decreased at night to see if this was causing the confusion however he did not do well and it had to be increased again. A 3-day VEEG did not show seizre activity.   Confusion at  night likely due to his Parkinson's disease and Parkinson's disease dementia. Dr. Vickey Huger started Seroquel which I think is a good choice. Continue Sinemet at current dose. Increase the Seroquel as needed at night now increased to  qhs. Did recommend following with a geriatric psychiatrist, Dr. Donell Beers was consulted and patient is following in 5 months. He was started on SSRI for his depression which is helping with mood.   Increase Rivastigmine to 4.5mg  twice a day caused nausea, will try the patch instead since he did not tolerate the pills above 1,5mg  bid. If patch too expensive can go back to lower dose exelon po. Can consider adding namenda after we titrate the Rivastigmine to the highest dose.  They had [facility] comes in as well as a Child psychotherapist, a physical therapist. They had home safety inspection. They have grab bars.   Discussed HCPOA and POA, they are in process now.   Naomie Dean, MD  [facility] [address] [place], [place] [address]  A total of 30 minutes was spent in with this patient and his family face-to-face. Over half this time was spent on counseling patient on the Parkinson's disease dementia diagnosis and different therapeutic options available.

## 2016-02-15 ENCOUNTER — Other Ambulatory Visit: Payer: Self-pay | Admitting: Neurology

## 2016-02-15 ENCOUNTER — Other Ambulatory Visit: Payer: Self-pay | Admitting: *Deleted

## 2016-02-15 MED ORDER — SALMETEROL XINAFOATE 50 MCG/DOSE IN AEPB: 1.0000 | INHALATION_SPRAY | Freq: Two times a day (BID) | RESPIRATORY_TRACT | Status: DC

## 2016-02-28 ENCOUNTER — Emergency Department (HOSPITAL_COMMUNITY): Payer: Medicare Other

## 2016-02-28 ENCOUNTER — Inpatient Hospital Stay (HOSPITAL_COMMUNITY)
Admission: EM | Admit: 2016-02-28 | Discharge: 2016-03-03 | DRG: 178 | Disposition: A | Discharge status: Skilled Nursing Facility | Payer: Medicare Other | Attending: Internal Medicine | Admitting: Internal Medicine

## 2016-02-28 ENCOUNTER — Encounter (HOSPITAL_COMMUNITY): Payer: Self-pay | Admitting: Emergency Medicine

## 2016-02-28 DIAGNOSIS — E871 Hypo-osmolality and hyponatremia: Secondary | ICD-10-CM | POA: Diagnosis present

## 2016-02-28 DIAGNOSIS — W19XXXA Unspecified fall, initial encounter: Secondary | ICD-10-CM

## 2016-02-28 DIAGNOSIS — E785 Hyperlipidemia, unspecified: Secondary | ICD-10-CM | POA: Diagnosis present

## 2016-02-28 DIAGNOSIS — G20 Parkinson's disease: Secondary | ICD-10-CM | POA: Diagnosis present

## 2016-02-28 DIAGNOSIS — Z7982 Long term (current) use of aspirin: Secondary | ICD-10-CM

## 2016-02-28 DIAGNOSIS — R413 Other amnesia: Secondary | ICD-10-CM | POA: Diagnosis present

## 2016-02-28 DIAGNOSIS — G2581 Restless legs syndrome: Secondary | ICD-10-CM | POA: Diagnosis present

## 2016-02-28 DIAGNOSIS — K219 Gastro-esophageal reflux disease without esophagitis: Secondary | ICD-10-CM | POA: Diagnosis present

## 2016-02-28 DIAGNOSIS — Z888 Allergy status to other drugs, medicaments and biological substances status: Secondary | ICD-10-CM

## 2016-02-28 DIAGNOSIS — F0281 Dementia in other diseases classified elsewhere with behavioral disturbance: Secondary | ICD-10-CM | POA: Diagnosis present

## 2016-02-28 DIAGNOSIS — R451 Restlessness and agitation: Secondary | ICD-10-CM | POA: Diagnosis present

## 2016-02-28 DIAGNOSIS — J690 Pneumonitis due to inhalation of food and vomit: Principal | ICD-10-CM | POA: Diagnosis present

## 2016-02-28 DIAGNOSIS — Z882 Allergy status to sulfonamides status: Secondary | ICD-10-CM

## 2016-02-28 DIAGNOSIS — R1313 Dysphagia, pharyngeal phase: Secondary | ICD-10-CM | POA: Diagnosis present

## 2016-02-28 DIAGNOSIS — J181 Lobar pneumonia, unspecified organism: Secondary | ICD-10-CM

## 2016-02-28 DIAGNOSIS — Z823 Family history of stroke: Secondary | ICD-10-CM

## 2016-02-28 DIAGNOSIS — W1830XA Fall on same level, unspecified, initial encounter: Secondary | ICD-10-CM | POA: Diagnosis present

## 2016-02-28 DIAGNOSIS — Z09 Encounter for follow-up examination after completed treatment for conditions other than malignant neoplasm: Secondary | ICD-10-CM

## 2016-02-28 DIAGNOSIS — Z883 Allergy status to other anti-infective agents status: Secondary | ICD-10-CM

## 2016-02-28 DIAGNOSIS — Z79899 Other long term (current) drug therapy: Secondary | ICD-10-CM

## 2016-02-28 DIAGNOSIS — Z7951 Long term (current) use of inhaled steroids: Secondary | ICD-10-CM

## 2016-02-28 DIAGNOSIS — J189 Pneumonia, unspecified organism: Secondary | ICD-10-CM | POA: Diagnosis present

## 2016-02-28 DIAGNOSIS — R4182 Altered mental status, unspecified: Secondary | ICD-10-CM

## 2016-02-28 DIAGNOSIS — R05 Cough: Secondary | ICD-10-CM | POA: Diagnosis not present

## 2016-02-28 DIAGNOSIS — Z820 Family history of epilepsy and other diseases of the nervous system: Secondary | ICD-10-CM

## 2016-02-28 DIAGNOSIS — R1310 Dysphagia, unspecified: Secondary | ICD-10-CM

## 2016-02-28 DIAGNOSIS — R1311 Dysphagia, oral phase: Secondary | ICD-10-CM | POA: Diagnosis present

## 2016-02-28 DIAGNOSIS — Z8249 Family history of ischemic heart disease and other diseases of the circulatory system: Secondary | ICD-10-CM

## 2016-02-28 LAB — I-STAT TROPONIN, ED: Troponin i, poc: 0.01 ng/mL (ref 0.00–0.08)

## 2016-02-28 LAB — COMPREHENSIVE METABOLIC PANEL
ALBUMIN: 3.9 g/dL (ref 3.5–5.0)
ALT: 21 U/L (ref 17–63)
ANION GAP: 8 (ref 5–15)
AST: 38 U/L (ref 15–41)
Alkaline Phosphatase: 69 U/L (ref 38–126)
BILIRUBIN TOTAL: 1.1 mg/dL (ref 0.3–1.2)
BUN: 19 mg/dL (ref 6–20)
CO2: 23 mmol/L (ref 22–32)
Calcium: 8.5 mg/dL — ABNORMAL LOW (ref 8.9–10.3)
Chloride: 99 mmol/L — ABNORMAL LOW (ref 101–111)
Creatinine, Ser: 0.74 mg/dL (ref 0.61–1.24)
GFR calc Af Amer: >60 mL/min (ref >60)
GFR calc non Af Amer: >60 mL/min (ref >60)
GLUCOSE: 130 mg/dL — AB (ref 65–99)
POTASSIUM: 3.9 mmol/L (ref 3.5–5.1)
SODIUM: 130 mmol/L — AB (ref 135–145)
TOTAL PROTEIN: 6.4 g/dL — AB (ref 6.5–8.1)

## 2016-02-28 LAB — CBC WITH DIFFERENTIAL/PLATELET
BASOS PCT: 0 %
Basophils Absolute: 0 10*3/uL (ref 0.0–0.1)
EOS ABS: 0 10*3/uL (ref 0.0–0.7)
EOS PCT: 0 %
HCT: 37.1 % — ABNORMAL LOW (ref 39.0–52.0)
Hemoglobin: 12.8 g/dL — ABNORMAL LOW (ref 13.0–17.0)
Lymphocytes Relative: 3 %
Lymphs Abs: 0.3 10*3/uL — ABNORMAL LOW (ref 0.7–4.0)
MCH: 30 pg (ref 26.0–34.0)
MCHC: 34.5 g/dL (ref 30.0–36.0)
MCV: 86.9 fL (ref 78.0–100.0)
MONO ABS: 0.6 10*3/uL (ref 0.1–1.0)
MONOS PCT: 6 %
Neutro Abs: 8.3 10*3/uL — ABNORMAL HIGH (ref 1.7–7.7)
Neutrophils Relative %: 91 %
Platelets: 135 10*3/uL — ABNORMAL LOW (ref 150–400)
RBC: 4.27 MIL/uL (ref 4.22–5.81)
RDW: 12.8 % (ref 11.5–15.5)
WBC: 9.3 10*3/uL (ref 4.0–10.5)

## 2016-02-28 LAB — URINE MICROSCOPIC-ADD ON

## 2016-02-28 LAB — URINALYSIS, ROUTINE W REFLEX MICROSCOPIC
Glucose, UA: NEGATIVE mg/dL
Hgb urine dipstick: NEGATIVE
Ketones, ur: 40 mg/dL — AB
Leukocytes, UA: NEGATIVE
NITRITE: NEGATIVE
PROTEIN: 100 mg/dL — AB
SPECIFIC GRAVITY, URINE: 1.031 — AB (ref 1.005–1.030)
pH: 6 (ref 5.0–8.0)

## 2016-02-28 MED ORDER — DEXTROSE 5 % IV SOLN
500.0000 mg | Freq: Once | INTRAVENOUS | Status: AC
  Administered 2016-02-29: 500 mg via INTRAVENOUS
  Filled 2016-02-28: qty 500

## 2016-02-28 MED ORDER — ACETAMINOPHEN 325 MG PO TABS
650.0000 mg | ORAL_TABLET | Freq: Once | ORAL | Status: AC
  Administered 2016-02-28: 650 mg via ORAL
  Filled 2016-02-28: qty 2

## 2016-02-28 MED ORDER — DEXTROSE 5 % IV SOLN
2.0000 g | Freq: Once | INTRAVENOUS | Status: AC
  Administered 2016-02-28: 2 g via INTRAVENOUS
  Filled 2016-02-28: qty 2

## 2016-02-28 NOTE — ED Provider Notes (Signed)
CSN: [id number]     Arrival date & time 02/28/16  2020 History   First MD Initiated Contact with Patient 02/28/16 2051     Chief Complaint  Patient presents with  . Cough   HPI  Mr. Torre is an 82 year old male with PMHx of Parkinson's, GERD, OA, interstitial lung disease and memory loss presenting with AMS. Pt's wife and son are at bedside and provide most of the history. Wife states that he has had a low grade fever with a Tmax of 99.8 since yesterday. They have attempted to give him aspirin but pt would refuse to swallow the pills and spit them out. His wife reports a cough productive of white-yellow phlegm. They also report that pt has had generalized weakness since yesterday and was so weak today that he fell while ambulating. Pt has shuffling gait at baseline but previously was able to ambulate independently. Son was able to break his fall but is unsure if he struck his head on the floor. Pt's wife also notes he has not been eating for the past few days. States that he has good fluid intake. Pt has been complaining of nausea and had one episode of food regurgitation after eating yesterday. Son and wife both agree that his mental status seems to be declining. He is typically confused at night time but has good function during the day. Pt is very poor historian with tangential speech.   Past Medical History  Diagnosis Date  . Osteoarthritis   . Hyperlipidemia   . GERD (gastroesophageal reflux disease)   . Restless legs syndrome (RLS)   . Interstitial lung disease (HCC)   . Pleural thickening   . Hemorrhoids   . Cognitive decline   . Parkinson disease (HCC)   . B12 deficiency   . Memory loss   . Non-rapid eye movement sleep arousal disorder, sleep walking type 04/12/2015   Past Surgical History  Procedure Laterality Date  . Cataract extraction  03/2008  . Lung decortication  1962  . Colonoscopy    . Hemorrhoid surgery  06/21/2012    Procedure: HEMORRHOIDECTOMY;  Surgeon: Clovis Pu.  Cornett, MD;  Location: [facility];  Service: General;  Laterality: N/A;  . Examination under anesthesia  08/27/2012    Procedure: EXAM UNDER ANESTHESIA;  Surgeon: Maisie Fus A. Cornett, MD;  Location: [facility];  Service: General;  Laterality: N/A;   Family History  Problem Relation Age of Onset  . Allergies Brother   . Heart disease Sister   . Stroke Sister   . Clotting disorder Sister   . Alzheimer's disease Father   . Dementia Father    Social History  Substance Use Topics  . Smoking status: Never Smoker   . Smokeless tobacco: Never Used  . Alcohol Use: No    Review of Systems  All other systems reviewed and are negative.     Allergies  Advair diskus; Aloe; Ambien; Halcion; Horizant; Lyrica; Sulfonamide derivatives; and Ciprofloxacin  Home Medications   Prior to Admission medications   Medication Sig Start Date End Date Taking? Authorizing Provider  acetaminophen (TYLENOL) 325 MG tablet Take 650 mg by mouth as needed.     Yes Historical Provider, MD  albuterol (PROAIR HFA) 108 (90 Base) MCG/ACT inhaler Inhale 2 puffs into the lungs every 6 (six) hours as needed for wheezing or shortness of breath. 11/15/15  Yes Leslye Peer, MD  aspirin 81 MG tablet Take 81 mg by mouth daily.  Yes Historical Provider, MD  carbidopa-levodopa (SINEMET IR) 25-100 MG tablet take 1 tablet by mouth three times a day 12/07/15  Yes Anson Fret, MD  cefdinir (OMNICEF) 300 MG capsule Take 1 capsule by mouth 2 (two) times daily. 02/27/16  Yes Historical Provider, MD  ergocalciferol (VITAMIN D2) 50000 UNITS capsule Take 50,000 Units by mouth once a week.     Yes Historical Provider, MD  fluticasone (FLONASE) 50 MCG/ACT nasal spray 2 sprays by Nasal route daily.     Yes Historical Provider, MD  gabapentin (NEURONTIN) 600 MG tablet Take 300 mg by mouth at bedtime.   Yes Historical Provider, MD  guaiFENesin (MUCINEX) 600 MG 12 hr tablet Take 600 mg by mouth as needed.     Yes Historical Provider, MD  Polyethylene Glycol 3350 GRAN by Does not apply route.   Yes Historical Provider, MD  QUEtiapine (SEROQUEL) 25 MG tablet Take 3 tablets (75 mg total) by mouth at bedtime. 01/18/16  Yes Anson Fret, MD  rivastigmine (EXELON) 4.6 mg/24hr Place 1 patch (4.6 mg total) onto the skin daily. 01/18/16  Yes Anson Fret, MD  rOPINIRole (REQUIP) 1 MG tablet take 1 and 1/2 tablets by mouth at bedtime 02/15/16  Yes Melvyn Novas, MD  salmeterol (SEREVENT) 50 MCG/DOSE diskus inhaler Inhale 1 puff into the lungs 2 (two) times daily. 02/15/16  Yes Leslye Peer, MD  vitamin B-12 (CYANOCOBALAMIN) 1000 MCG tablet Take 1,000 mcg by mouth daily.   Yes Historical Provider, MD  Vortioxetine HBr (TRINTELLIX) 10 MG TABS Take 1 tablet (10 mg total) by mouth every morning. 12/29/15  Yes Archer Asa, MD   BP 133/64 mmHg  Pulse 92  Temp(Src) 98 F (36.7 C) (Oral)  Resp 20  Ht 5\' 6"  (1.676 m)  Wt 68.04 kg  BMI 24.22 kg/m2  SpO2 94% Physical Exam  Constitutional: He appears well-developed and well-nourished. No distress.  HENT:  Head: Normocephalic and atraumatic.  Mouth/Throat: Uvula is midline. Mucous membranes are dry. No posterior oropharyngeal erythema.  Eyes: Conjunctivae and EOM are normal. Right eye exhibits no discharge. Left eye exhibits no discharge. No scleral icterus.  Left pupil appears more dilated than right. Both are round and reactive to light. EOM intact.   Neck: Normal range of motion. Neck supple.  Cardiovascular: Normal rate, regular rhythm and normal heart sounds.   Pulmonary/Chest: Effort normal. No respiratory distress. He has no wheezes. He has rales.  Breathing unlabored. Crackles and decreased breath sounds in right base.   Abdominal: Soft. Bowel sounds are normal. He exhibits no distension. There is no tenderness. There is no rebound and no guarding.  Musculoskeletal: Normal range of motion.  Neurological: He is alert. Coordination normal.   Disorganized speech. Left pupil slightly more dilated than right. Both are round and reactive to light. No facial droop or speech slur. Facial movements symmetrical. Uvula and tongue protrusion midline. 5/5 strength in all extremities. Sensation to light touch intact. Intermittent bilateral hand tremors.   Skin: Skin is warm and dry.  Psychiatric: He has a normal mood and affect. His behavior is normal. His speech is tangential. Cognition and memory are impaired.  Nursing note and vitals reviewed.   ED Course  Procedures (including critical care time) Labs Review Labs Reviewed  CBC WITH DIFFERENTIAL/PLATELET - Abnormal; Notable for the following:    Hemoglobin 12.8 (*)    HCT 37.1 (*)    Platelets 135 (*)    Neutro Abs 8.3 (*)  Lymphs Abs 0.3 (*)    All other components within normal limits  COMPREHENSIVE METABOLIC PANEL - Abnormal; Notable for the following:    Sodium 130 (*)    Chloride 99 (*)    Glucose, Bld 130 (*)    Calcium 8.5 (*)    Total Protein 6.4 (*)    All other components within normal limits  URINALYSIS, ROUTINE W REFLEX MICROSCOPIC (NOT AT [facility]) - Abnormal; Notable for the following:    Color, Urine AMBER (*)    Specific Gravity, Urine 1.031 (*)    Bilirubin Urine SMALL (*)    Ketones, ur 40 (*)    Protein, ur 100 (*)    All other components within normal limits  URINE MICROSCOPIC-ADD ON - Abnormal; Notable for the following:    Squamous Epithelial / LPF 0-5 (*)    Bacteria, UA MANY (*)    Casts HYALINE CASTS (*)    All other components within normal limits  URINE CULTURE  CULTURE, BLOOD (ROUTINE X 2)  CULTURE, BLOOD (ROUTINE X 2)  CULTURE, EXPECTORATED SPUTUM-ASSESSMENT  GRAM STAIN  HIV ANTIBODY (ROUTINE TESTING)  STREP PNEUMONIAE URINARY ANTIGEN  I-STAT TROPOININ, ED  I-STAT CG4 LACTIC ACID, ED  I-STAT CG4 LACTIC ACID, ED    Imaging Review Dg Chest 2 View  02/28/2016  CLINICAL DATA:  Cough and fever EXAM: CHEST  2 VIEW COMPARISON:  06/18/2012  FINDINGS: Extensive calcified pleural thickening in the right lung base unchanged. Underlying right lung base not well evaluated due to extensive calcified pleural thickening. Mild pleural thickening on the left unchanged. Cardiac enlargement without heart failure. IMPRESSION: Extensive chronic calcified pleural thickening in the right lung base and mild pleural scarring on the left. No superimposed acute abnormality. No change from 2013. Electronically Signed   By: Marlan Palau M.D.   On: 02/28/2016 21:08   Ct Head Wo Contrast  02/28/2016  CLINICAL DATA:  Fall. Recent cough and fever. Syncopal episode tonight. EXAM: CT HEAD WITHOUT CONTRAST CT CERVICAL SPINE WITHOUT CONTRAST TECHNIQUE: Multidetector CT imaging of the head and cervical spine was performed following the standard protocol without intravenous contrast. Multiplanar CT image reconstructions of the cervical spine were also generated. COMPARISON:  12/16/2013 FINDINGS: CT HEAD FINDINGS The brainstem, cerebellum, cerebral peduncles, thalami, basal ganglia, basilar cisterns, and ventricular system appear within normal limits. Periventricular white matter and corona radiata hypodensities favor chronic ischemic microvascular white matter disease. No intracranial hemorrhage, mass lesion, or acute CVA. Chronic ethmoid and frontal sinusitis. CT CERVICAL SPINE FINDINGS Despite efforts by the technologist and patient, motion artifact is present on today's exam and could not be eliminated. This reduces exam sensitivity and specificity. Fused facet joints on the left at C4-5. Loss of intervertebral disc height at C5-6 and C6-7. 2 mm degenerative grade 1 anterolisthesis at C4-5. Posterior osseous ridging and uncinate spurring at C5-6 and C6-7, causing moderate left osseous foraminal stenosis at C5-6, moderate right foraminal stenosis at C6-7, and mild right foraminal stenosis at C5-6. No prevertebral soft tissue swelling. No cervical spine fracture identified.  Degenerative right sternoclavicular arthropathy. Biapical pleural parenchymal scarring. IMPRESSION: 1. No acute intracranial findings and no acute cervical spine findings. 2. Cervical spondylosis causing foraminal impingement at C5-6 and C6-7. 3. 2 mm degenerative grade 1 anterolisthesis at C4-5. 4. Degenerative right sternoclavicular arthropathy. 5. Biapical pleural parenchymal scarring. 6. Chronic ethmoid and frontal sinusitis. 7. Periventricular white matter and corona radiata hypodensities favor chronic ischemic microvascular white matter disease. Electronically Signed   By: Zollie Beckers  Ova FreshwaterLiebkemann M.D.   On: 02/28/2016 22:01   Ct Cervical Spine Wo Contrast  02/28/2016  CLINICAL DATA:  Fall. Recent cough and fever. Syncopal episode tonight. EXAM: CT HEAD WITHOUT CONTRAST CT CERVICAL SPINE WITHOUT CONTRAST TECHNIQUE: Multidetector CT imaging of the head and cervical spine was performed following the standard protocol without intravenous contrast. Multiplanar CT image reconstructions of the cervical spine were also generated. COMPARISON:  12/16/2013 FINDINGS: CT HEAD FINDINGS The brainstem, cerebellum, cerebral peduncles, thalami, basal ganglia, basilar cisterns, and ventricular system appear within normal limits. Periventricular white matter and corona radiata hypodensities favor chronic ischemic microvascular white matter disease. No intracranial hemorrhage, mass lesion, or acute CVA. Chronic ethmoid and frontal sinusitis. CT CERVICAL SPINE FINDINGS Despite efforts by the technologist and patient, motion artifact is present on today's exam and could not be eliminated. This reduces exam sensitivity and specificity. Fused facet joints on the left at C4-5. Loss of intervertebral disc height at C5-6 and C6-7. 2 mm degenerative grade 1 anterolisthesis at C4-5. Posterior osseous ridging and uncinate spurring at C5-6 and C6-7, causing moderate left osseous foraminal stenosis at C5-6, moderate right foraminal stenosis at  C6-7, and mild right foraminal stenosis at C5-6. No prevertebral soft tissue swelling. No cervical spine fracture identified. Degenerative right sternoclavicular arthropathy. Biapical pleural parenchymal scarring. IMPRESSION: 1. No acute intracranial findings and no acute cervical spine findings. 2. Cervical spondylosis causing foraminal impingement at C5-6 and C6-7. 3. 2 mm degenerative grade 1 anterolisthesis at C4-5. 4. Degenerative right sternoclavicular arthropathy. 5. Biapical pleural parenchymal scarring. 6. Chronic ethmoid and frontal sinusitis. 7. Periventricular white matter and corona radiata hypodensities favor chronic ischemic microvascular white matter disease. Electronically Signed   By: Gaylyn RongWalter  Liebkemann M.D.   On: 02/28/2016 22:01   I have personally reviewed and evaluated these images and lab results as part of my medical decision-making.   EKG Interpretation   Date/Time:  Monday Feb 28 2016 20:45:32 EDT Ventricular Rate:  80 PR Interval:  203 QRS Duration: 96 QT Interval:  372 QTC Calculation: 429 R Axis:   -16 Text Interpretation:  Sinus rhythm Borderline left axis deviation A fib w/  RVR from previous EKG has resolved, now sinus rhythm Confirmed by LITTLE  MD, RACHEL [phone]([id number]) on 02/28/2016 8:53:44 PM      MDM   Final diagnoses:  Fall  Altered mental status, unspecified altered mental status type   82 year old male presenting with altered mental status, cough and fall. Patient has Parkinson's disease. Rectal temperature 101.4 degrees. Mucus membranes are dry. Nonfocal neuro exam. Decreased breath sounds with crackles in right lung base. Heart regular rate and rhythm. Abdomen is soft, nontender without peritoneal signs. No leukocytosis. Mildly hyponatremic. Lactic acid 1.42. Urinalysis with many bacteria but no leukocytes or nitrites. Troponin negative with a nonischemic EKG. CT head and neck negative. Chest x-ray shows pleural thickening in the right lung base without  change from 2013. History of likely aspiration yesterday and productive cough today; possible aspiration pneumonia causing altered mental status. Urine sent for culture. Started on IV antibiotics for pneumonia coverage and consulted hospitalist for admission. Dr. Julian ReilGardner accepting.  Alveta HeimlichStevi Hitomi Slape, PA-C 02/29/16 0425  Laurence Spatesachel Morgan Little, MD 03/01/16 [id number]

## 2016-02-28 NOTE — ED Notes (Signed)
PA at bedside.

## 2016-02-28 NOTE — ED Notes (Signed)
Pt transported to XR.  

## 2016-02-28 NOTE — ED Notes (Signed)
Pt reports yesterday morning he began to have a productive cough with fever.  Per spouse highest fever was 99.8.  Producing clear fluid with occasional yellow mucus per pt.

## 2016-02-28 NOTE — ED Notes (Signed)
PA Stevi made aware of fever.

## 2016-02-29 DIAGNOSIS — K219 Gastro-esophageal reflux disease without esophagitis: Secondary | ICD-10-CM | POA: Diagnosis present

## 2016-02-29 DIAGNOSIS — F0281 Dementia in other diseases classified elsewhere with behavioral disturbance: Secondary | ICD-10-CM | POA: Diagnosis present

## 2016-02-29 DIAGNOSIS — G20 Parkinson's disease: Secondary | ICD-10-CM | POA: Diagnosis present

## 2016-02-29 DIAGNOSIS — Z888 Allergy status to other drugs, medicaments and biological substances status: Secondary | ICD-10-CM | POA: Diagnosis not present

## 2016-02-29 DIAGNOSIS — J690 Pneumonitis due to inhalation of food and vomit: Secondary | ICD-10-CM | POA: Diagnosis present

## 2016-02-29 DIAGNOSIS — Z8249 Family history of ischemic heart disease and other diseases of the circulatory system: Secondary | ICD-10-CM | POA: Diagnosis not present

## 2016-02-29 DIAGNOSIS — R1310 Dysphagia, unspecified: Secondary | ICD-10-CM | POA: Diagnosis not present

## 2016-02-29 DIAGNOSIS — E871 Hypo-osmolality and hyponatremia: Secondary | ICD-10-CM | POA: Diagnosis present

## 2016-02-29 DIAGNOSIS — Z820 Family history of epilepsy and other diseases of the nervous system: Secondary | ICD-10-CM | POA: Diagnosis not present

## 2016-02-29 DIAGNOSIS — Z7951 Long term (current) use of inhaled steroids: Secondary | ICD-10-CM | POA: Diagnosis not present

## 2016-02-29 DIAGNOSIS — Z883 Allergy status to other anti-infective agents status: Secondary | ICD-10-CM | POA: Diagnosis not present

## 2016-02-29 DIAGNOSIS — W1830XA Fall on same level, unspecified, initial encounter: Secondary | ICD-10-CM | POA: Diagnosis present

## 2016-02-29 DIAGNOSIS — G2581 Restless legs syndrome: Secondary | ICD-10-CM | POA: Diagnosis present

## 2016-02-29 DIAGNOSIS — Z79899 Other long term (current) drug therapy: Secondary | ICD-10-CM | POA: Diagnosis not present

## 2016-02-29 DIAGNOSIS — R410 Disorientation, unspecified: Secondary | ICD-10-CM | POA: Diagnosis not present

## 2016-02-29 DIAGNOSIS — J189 Pneumonia, unspecified organism: Secondary | ICD-10-CM | POA: Diagnosis present

## 2016-02-29 DIAGNOSIS — R1311 Dysphagia, oral phase: Secondary | ICD-10-CM | POA: Diagnosis present

## 2016-02-29 DIAGNOSIS — Z7982 Long term (current) use of aspirin: Secondary | ICD-10-CM | POA: Diagnosis not present

## 2016-02-29 DIAGNOSIS — R451 Restlessness and agitation: Secondary | ICD-10-CM | POA: Diagnosis present

## 2016-02-29 DIAGNOSIS — Z823 Family history of stroke: Secondary | ICD-10-CM | POA: Diagnosis not present

## 2016-02-29 DIAGNOSIS — R1313 Dysphagia, pharyngeal phase: Secondary | ICD-10-CM | POA: Diagnosis present

## 2016-02-29 DIAGNOSIS — Z09 Encounter for follow-up examination after completed treatment for conditions other than malignant neoplasm: Secondary | ICD-10-CM | POA: Diagnosis not present

## 2016-02-29 DIAGNOSIS — R05 Cough: Secondary | ICD-10-CM | POA: Diagnosis present

## 2016-02-29 DIAGNOSIS — R413 Other amnesia: Secondary | ICD-10-CM | POA: Diagnosis present

## 2016-02-29 DIAGNOSIS — J181 Lobar pneumonia, unspecified organism: Secondary | ICD-10-CM

## 2016-02-29 DIAGNOSIS — R4182 Altered mental status, unspecified: Secondary | ICD-10-CM | POA: Diagnosis present

## 2016-02-29 DIAGNOSIS — Z882 Allergy status to sulfonamides status: Secondary | ICD-10-CM | POA: Diagnosis not present

## 2016-02-29 DIAGNOSIS — E785 Hyperlipidemia, unspecified: Secondary | ICD-10-CM | POA: Diagnosis present

## 2016-02-29 LAB — I-STAT CG4 LACTIC ACID, ED: Lactic Acid, Venous: 1.42 mmol/L (ref 0.5–2.0)

## 2016-02-29 LAB — EXPECTORATED SPUTUM ASSESSMENT W GRAM STAIN, RFLX TO RESP C

## 2016-02-29 LAB — EXPECTORATED SPUTUM ASSESSMENT W REFEX TO RESP CULTURE

## 2016-02-29 LAB — HIV ANTIBODY (ROUTINE TESTING W REFLEX): HIV SCREEN 4TH GENERATION: NONREACTIVE

## 2016-02-29 LAB — STREP PNEUMONIAE URINARY ANTIGEN: Strep Pneumo Urinary Antigen: NEGATIVE

## 2016-02-29 MED ORDER — PIPERACILLIN-TAZOBACTAM 3.375 G IVPB
3.3750 g | Freq: Three times a day (TID) | INTRAVENOUS | Status: DC
  Administered 2016-02-29 – 2016-03-03 (×9): 3.375 g via INTRAVENOUS
  Filled 2016-02-29 (×10): qty 50

## 2016-02-29 MED ORDER — HALOPERIDOL LACTATE 5 MG/ML IJ SOLN
2.0000 mg | Freq: Once | INTRAMUSCULAR | Status: AC
  Administered 2016-02-29: 2 mg via INTRAMUSCULAR

## 2016-02-29 MED ORDER — QUETIAPINE FUMARATE 50 MG PO TABS: 75.0000 mg | ORAL_TABLET | Freq: Every day | ORAL | Status: DC

## 2016-02-29 MED ORDER — DIAZEPAM 5 MG/ML IJ SOLN
2.5000 mg | Freq: Once | INTRAMUSCULAR | Status: AC
  Administered 2016-02-29: 2.5 mg via INTRAVENOUS
  Filled 2016-02-29: qty 2

## 2016-02-29 MED ORDER — HALOPERIDOL LACTATE 5 MG/ML IJ SOLN
2.5000 mg | Freq: Once | INTRAMUSCULAR | Status: AC
  Administered 2016-02-29: 2.5 mg via INTRAVENOUS
  Filled 2016-02-29: qty 1

## 2016-02-29 MED ORDER — GUAIFENESIN ER 600 MG PO TB12: 600.0000 mg | ORAL_TABLET | ORAL | Status: DC | PRN

## 2016-02-29 MED ORDER — ALBUTEROL SULFATE (2.5 MG/3ML) 0.083% IN NEBU
2.5000 mg | INHALATION_SOLUTION | Freq: Four times a day (QID) | RESPIRATORY_TRACT | Status: DC | PRN
  Administered 2016-02-29 – 2016-03-01 (×3): 2.5 mg via RESPIRATORY_TRACT
  Filled 2016-02-29 (×3): qty 3

## 2016-02-29 MED ORDER — DIPHENHYDRAMINE HCL 50 MG/ML IJ SOLN
25.0000 mg | Freq: Once | INTRAMUSCULAR | Status: AC
  Administered 2016-02-29: 25 mg via INTRAMUSCULAR
  Filled 2016-02-29: qty 1

## 2016-02-29 MED ORDER — CARBIDOPA-LEVODOPA 25-100 MG PO TABS
1.0000 | ORAL_TABLET | Freq: Three times a day (TID) | ORAL | Status: DC
  Administered 2016-02-29 – 2016-03-03 (×12): 1 via ORAL
  Filled 2016-02-29 (×13): qty 1

## 2016-02-29 MED ORDER — QUETIAPINE FUMARATE 50 MG PO TABS
75.0000 mg | ORAL_TABLET | Freq: Every day | ORAL | Status: DC
  Administered 2016-02-29 – 2016-03-02 (×3): 75 mg via ORAL
  Filled 2016-02-29 (×4): qty 1

## 2016-02-29 MED ORDER — ROPINIROLE HCL 1 MG PO TABS
1.5000 mg | ORAL_TABLET | Freq: Once | ORAL | Status: AC
  Administered 2016-02-29: 1.5 mg via ORAL
  Filled 2016-02-29: qty 1

## 2016-02-29 MED ORDER — LORAZEPAM 2 MG/ML IJ SOLN
1.0000 mg | Freq: Once | INTRAMUSCULAR | Status: AC
  Administered 2016-02-29: 1 mg via INTRAVENOUS
  Filled 2016-02-29: qty 1

## 2016-02-29 MED ORDER — SODIUM CHLORIDE 0.9 % IV BOLUS (SEPSIS)
1000.0000 mL | Freq: Once | INTRAVENOUS | Status: AC
  Administered 2016-02-29: 1000 mL via INTRAVENOUS

## 2016-02-29 MED ORDER — ARFORMOTEROL TARTRATE 15 MCG/2ML IN NEBU
15.0000 ug | INHALATION_SOLUTION | Freq: Two times a day (BID) | RESPIRATORY_TRACT | Status: DC
  Administered 2016-02-29 – 2016-03-02 (×5): 15 ug via RESPIRATORY_TRACT
  Filled 2016-02-29 (×11): qty 2

## 2016-02-29 MED ORDER — IBUPROFEN 200 MG PO TABS
400.0000 mg | ORAL_TABLET | Freq: Once | ORAL | Status: AC
  Administered 2016-02-29: 400 mg via ORAL
  Filled 2016-02-29: qty 2

## 2016-02-29 MED ORDER — HALOPERIDOL LACTATE 5 MG/ML IJ SOLN
5.0000 mg | Freq: Once | INTRAMUSCULAR | Status: AC
  Administered 2016-02-29: 5 mg via INTRAVENOUS
  Filled 2016-02-29: qty 1

## 2016-02-29 MED ORDER — DEXTROSE 5 % IV SOLN: 1.0000 g | Freq: Every day | INTRAVENOUS | Status: DC

## 2016-02-29 MED ORDER — FLUTICASONE PROPIONATE 50 MCG/ACT NA SUSP
2.0000 | Freq: Every day | NASAL | Status: DC
  Administered 2016-02-29 – 2016-03-03 (×4): 2 via NASAL
  Filled 2016-02-29: qty 16

## 2016-02-29 MED ORDER — DEXTROSE 5 % IV SOLN
500.0000 mg | Freq: Every day | INTRAVENOUS | Status: DC
  Administered 2016-02-29 – 2016-03-02 (×3): 500 mg via INTRAVENOUS
  Filled 2016-02-29 (×4): qty 500

## 2016-02-29 MED ORDER — ENOXAPARIN SODIUM 40 MG/0.4ML ~~LOC~~ SOLN
40.0000 mg | SUBCUTANEOUS | Status: DC
  Administered 2016-02-29 – 2016-03-03 (×4): 40 mg via SUBCUTANEOUS
  Filled 2016-02-29 (×4): qty 0.4

## 2016-02-29 MED ORDER — ASPIRIN EC 81 MG PO TBEC
81.0000 mg | DELAYED_RELEASE_TABLET | Freq: Every day | ORAL | Status: DC
  Administered 2016-02-29 – 2016-03-03 (×4): 81 mg via ORAL
  Filled 2016-02-29 (×4): qty 1

## 2016-02-29 MED ORDER — GABAPENTIN 300 MG PO CAPS
300.0000 mg | ORAL_CAPSULE | Freq: Every day | ORAL | Status: DC
  Administered 2016-02-29 – 2016-03-01 (×2): 300 mg via ORAL
  Filled 2016-02-29 (×4): qty 1

## 2016-02-29 MED ORDER — QUETIAPINE FUMARATE 50 MG PO TABS
75.0000 mg | ORAL_TABLET | Freq: Once | ORAL | Status: AC
  Administered 2016-02-29: 75 mg via ORAL
  Filled 2016-02-29: qty 1

## 2016-02-29 MED ORDER — ALBUTEROL SULFATE HFA 108 (90 BASE) MCG/ACT IN AERS: 2.0000 | INHALATION_SPRAY | Freq: Four times a day (QID) | RESPIRATORY_TRACT | Status: DC | PRN

## 2016-02-29 MED ORDER — HALOPERIDOL LACTATE 5 MG/ML IJ SOLN
2.0000 mg | Freq: Once | INTRAMUSCULAR | Status: DC
  Filled 2016-02-29: qty 1

## 2016-02-29 MED ORDER — ACETAMINOPHEN 325 MG PO TABS
650.0000 mg | ORAL_TABLET | ORAL | Status: DC | PRN
  Administered 2016-03-01: 650 mg via ORAL
  Filled 2016-02-29: qty 2

## 2016-02-29 MED ORDER — VORTIOXETINE HBR 10 MG PO TABS
10.0000 mg | ORAL_TABLET | Freq: Every morning | ORAL | Status: DC
  Administered 2016-02-29 – 2016-03-03 (×4): 10 mg via ORAL
  Filled 2016-02-29 (×4): qty 1

## 2016-02-29 MED ORDER — RIVASTIGMINE 4.6 MG/24HR TD PT24
4.6000 mg | MEDICATED_PATCH | Freq: Every day | TRANSDERMAL | Status: DC
  Administered 2016-02-29 – 2016-03-03 (×4): 4.6 mg via TRANSDERMAL
  Filled 2016-02-29 (×5): qty 1

## 2016-02-29 NOTE — Progress Notes (Signed)
Patient in bed restless and confused. Skin damp and warm. MAE. V/S stable and patient to have a sitter on days. To get seroquel and spoke with NP about meds patient has missed including requip and neurotin. Updated the RN on day shift.

## 2016-02-29 NOTE — H&P (Signed)
History and Physical    Wesley Pierce ZOX:[id number] DOB: March 20, 1931 DOA: 02/28/2016   PCP: Minda MeoARONSON,RICHARD A, MD Chief Complaint:  Chief Complaint  Patient presents with  . Cough    HPI: Wesley FrayCharles E Hedeen is a 82 y.o. male with medical history significant of Parkinson's disease and dementia which has been worsening recently, ILD.  Patient presents to the ED with productive cough and fever that onset Sunday.  Yesterday morning he also had an episode of what sounds like near aspiration where he choked on some food he was eating.  This is the first known such episode.  Fever at home with Tm 99.8 (is running true 101.4 fever in ED today).  Cough productive of purulent sputum.  ED Course: Patient spiked fever in ED as noted above.  CXR was read as pleural disease unchanged from 2013.  Clinically however it was felt that he likely has RLL PNA.  Review of Systems: As per HPI otherwise 10 point review of systems negative.    Past Medical History  Diagnosis Date  . Osteoarthritis   . Hyperlipidemia   . GERD (gastroesophageal reflux disease)   . Restless legs syndrome (RLS)   . Interstitial lung disease (HCC)   . Pleural thickening   . Hemorrhoids   . Cognitive decline   . Parkinson disease (HCC)   . B12 deficiency   . Memory loss   . Non-rapid eye movement sleep arousal disorder, sleep walking type 04/12/2015    Past Surgical History  Procedure Laterality Date  . Cataract extraction  03/2008  . Lung decortication  1962  . Colonoscopy    . Hemorrhoid surgery  06/21/2012    Procedure: HEMORRHOIDECTOMY;  Surgeon: Clovis Puhomas A. Cornett, MD;  Location: [facility];  Service: General;  Laterality: N/A;  . Examination under anesthesia  08/27/2012    Procedure: EXAM UNDER ANESTHESIA;  Surgeon: Maisie Fushomas A. Cornett, MD;  Location: [facility];  Service: General;  Laterality: N/A;     reports that he has never smoked. He has never used smokeless tobacco. He  reports that he does not drink alcohol or use illicit drugs.  Allergies  Allergen Reactions  . Advair Diskus [Fluticasone-Salmeterol]     anxiety  . Aloe Swelling  . Ambien [Zolpidem Tartrate] Other (See Comments)    Sleep walking  . Halcion [Triazolam]     Sleep Walking  . Horizant [Gabapentin]     Sleep walking and halucinations  . Lyrica [Pregabalin] Other (See Comments)    Sleep walking   . Sulfonamide Derivatives     Pt unable to remember  . Ciprofloxacin Rash    Family History  Problem Relation Age of Onset  . Allergies Brother   . Heart disease Sister   . Stroke Sister   . Clotting disorder Sister   . Alzheimer's disease Father   . Dementia Father      Prior to Admission medications   Medication Sig Start Date End Date Taking? Authorizing Provider  acetaminophen (TYLENOL) 325 MG tablet Take 650 mg by mouth as needed.     Yes Historical Provider, MD  albuterol (PROAIR HFA) 108 (90 Base) MCG/ACT inhaler Inhale 2 puffs into the lungs every 6 (six) hours as needed for wheezing or shortness of breath. 11/15/15  Yes Leslye Peerobert S Byrum, MD  aspirin 81 MG tablet Take 81 mg by mouth daily.     Yes Historical Provider, MD  carbidopa-levodopa (SINEMET IR) 25-100 MG tablet take 1 tablet by  mouth three times a day 12/07/15  Yes Anson Fret, MD  cefdinir (OMNICEF) 300 MG capsule Take 1 capsule by mouth 2 (two) times daily. 02/27/16  Yes Historical Provider, MD  ergocalciferol (VITAMIN D2) 50000 UNITS capsule Take 50,000 Units by mouth once a week.     Yes Historical Provider, MD  fluticasone (FLONASE) 50 MCG/ACT nasal spray 2 sprays by Nasal route daily.     Yes Historical Provider, MD  gabapentin (NEURONTIN) 600 MG tablet Take 300 mg by mouth at bedtime.   Yes Historical Provider, MD  guaiFENesin (MUCINEX) 600 MG 12 hr tablet Take 600 mg by mouth as needed.    Yes Historical Provider, MD  Polyethylene Glycol 3350 GRAN by Does not apply route.   Yes Historical Provider, MD    QUEtiapine (SEROQUEL) 25 MG tablet Take 3 tablets (75 mg total) by mouth at bedtime. 01/18/16  Yes Anson Fret, MD  rivastigmine (EXELON) 4.6 mg/24hr Place 1 patch (4.6 mg total) onto the skin daily. 01/18/16  Yes Anson Fret, MD  rOPINIRole (REQUIP) 1 MG tablet take 1 and 1/2 tablets by mouth at bedtime 02/15/16  Yes Melvyn Novas, MD  salmeterol (SEREVENT) 50 MCG/DOSE diskus inhaler Inhale 1 puff into the lungs 2 (two) times daily. 02/15/16  Yes Leslye Peer, MD  vitamin B-12 (CYANOCOBALAMIN) 1000 MCG tablet Take 1,000 mcg by mouth daily.   Yes Historical Provider, MD  Vortioxetine HBr (TRINTELLIX) 10 MG TABS Take 1 tablet (10 mg total) by mouth every morning. 12/29/15  Yes Archer Asa, MD    Physical Exam: Filed Vitals:   02/28/16 2350 02/29/16 0116 02/29/16 0137 02/29/16 0238  BP: 152/75  115/90 133/64  Pulse: 75  80 92  Temp:  99.9 F (37.7 C)  98 F (36.7 C)  TempSrc:  Rectal  Oral  Resp: 18  16 20   Height:      Weight:      SpO2: 95%  97% 94%      Constitutional: NAD, calm, comfortable Eyes: Dilated L pupil, lids and conjunctivae normal ENMT: Mucous membranes are moist. Posterior pharynx clear of any exudate or lesions.Normal dentition.  Neck: normal, supple, no masses, no thyromegaly Respiratory: Diminished in R base Cardiovascular: Regular rate and rhythm, no murmurs / rubs / gallops. No extremity edema. 2+ pedal pulses. No carotid bruits.  Abdomen: no tenderness, no masses palpated. No hepatosplenomegaly. Bowel sounds positive.  Musculoskeletal: no clubbing / cyanosis. No joint deformity upper and lower extremities. Good ROM, no contractures. Normal muscle tone.  Skin: no rashes, lesions, ulcers. No induration Neurologic: CN 2-12 grossly intact. Sensation intact, DTR normal. Strength 5/5 in all 4. Shuffling gait Psychiatric: Tangential speech and thought   Labs on Admission: I have personally reviewed following labs and imaging studies  CBC:  Recent  Labs Lab 02/28/16 2154  WBC 9.3  NEUTROABS 8.3*  HGB 12.8*  HCT 37.1*  MCV 86.9  PLT 135*   Basic Metabolic Panel:  Recent Labs Lab 02/28/16 2154  NA 130*  K 3.9  CL 99*  CO2 23  GLUCOSE 130*  BUN 19  CREATININE 0.74  CALCIUM 8.5*   GFR: Estimated Creatinine Clearance: 63.1 mL/min (by C-G formula based on Cr of 0.74). Liver Function Tests:  Recent Labs Lab 02/28/16 2154  AST 38  ALT 21  ALKPHOS 69  BILITOT 1.1  PROT 6.4*  ALBUMIN 3.9   No results for input(s): LIPASE, AMYLASE in the last 168 hours. No results for input(s):  AMMONIA in the last 168 hours. Coagulation Profile: No results for input(s): INR, PROTIME in the last 168 hours. Cardiac Enzymes: No results for input(s): CKTOTAL, CKMB, CKMBINDEX, TROPONINI in the last 168 hours. BNP (last 3 results) No results for input(s): PROBNP in the last 8760 hours. HbA1C: No results for input(s): HGBA1C in the last 72 hours. CBG: No results for input(s): GLUCAP in the last 168 hours. Lipid Profile: No results for input(s): CHOL, HDL, LDLCALC, TRIG, CHOLHDL, LDLDIRECT in the last 72 hours. Thyroid Function Tests: No results for input(s): TSH, T4TOTAL, FREET4, T3FREE, THYROIDAB in the last 72 hours. Anemia Panel: No results for input(s): VITAMINB12, FOLATE, FERRITIN, TIBC, IRON, RETICCTPCT in the last 72 hours. Urine analysis:    Component Value Date/Time   COLORURINE AMBER* 02/28/2016 2245   APPEARANCEUR CLEAR 02/28/2016 2245   LABSPEC 1.031* 02/28/2016 2245   PHURINE 6.0 02/28/2016 2245   GLUCOSEU NEGATIVE 02/28/2016 2245   HGBUR NEGATIVE 02/28/2016 2245   BILIRUBINUR SMALL* 02/28/2016 2245   KETONESUR 40* 02/28/2016 2245   PROTEINUR 100* 02/28/2016 2245   UROBILINOGEN 1.0 05/13/2009 1824   NITRITE NEGATIVE 02/28/2016 2245   LEUKOCYTESUR NEGATIVE 02/28/2016 2245   Sepsis Labs: @LABRCNTIP (procalcitonin:4,lacticidven:4) )No results found for this or any previous visit (from the past 240 hour(s)).    Radiological Exams on Admission: Dg Chest 2 View  02/28/2016  CLINICAL DATA:  Cough and fever EXAM: CHEST  2 VIEW COMPARISON:  06/18/2012 FINDINGS: Extensive calcified pleural thickening in the right lung base unchanged. Underlying right lung base not well evaluated due to extensive calcified pleural thickening. Mild pleural thickening on the left unchanged. Cardiac enlargement without heart failure. IMPRESSION: Extensive chronic calcified pleural thickening in the right lung base and mild pleural scarring on the left. No superimposed acute abnormality. No change from 2013. Electronically Signed   By: Marlan Palau M.D.   On: 02/28/2016 21:08   Ct Head Wo Contrast  02/28/2016  CLINICAL DATA:  Fall. Recent cough and fever. Syncopal episode tonight. EXAM: CT HEAD WITHOUT CONTRAST CT CERVICAL SPINE WITHOUT CONTRAST TECHNIQUE: Multidetector CT imaging of the head and cervical spine was performed following the standard protocol without intravenous contrast. Multiplanar CT image reconstructions of the cervical spine were also generated. COMPARISON:  12/16/2013 FINDINGS: CT HEAD FINDINGS The brainstem, cerebellum, cerebral peduncles, thalami, basal ganglia, basilar cisterns, and ventricular system appear within normal limits. Periventricular white matter and corona radiata hypodensities favor chronic ischemic microvascular white matter disease. No intracranial hemorrhage, mass lesion, or acute CVA. Chronic ethmoid and frontal sinusitis. CT CERVICAL SPINE FINDINGS Despite efforts by the technologist and patient, motion artifact is present on today's exam and could not be eliminated. This reduces exam sensitivity and specificity. Fused facet joints on the left at C4-5. Loss of intervertebral disc height at C5-6 and C6-7. 2 mm degenerative grade 1 anterolisthesis at C4-5. Posterior osseous ridging and uncinate spurring at C5-6 and C6-7, causing moderate left osseous foraminal stenosis at C5-6, moderate right  foraminal stenosis at C6-7, and mild right foraminal stenosis at C5-6. No prevertebral soft tissue swelling. No cervical spine fracture identified. Degenerative right sternoclavicular arthropathy. Biapical pleural parenchymal scarring. IMPRESSION: 1. No acute intracranial findings and no acute cervical spine findings. 2. Cervical spondylosis causing foraminal impingement at C5-6 and C6-7. 3. 2 mm degenerative grade 1 anterolisthesis at C4-5. 4. Degenerative right sternoclavicular arthropathy. 5. Biapical pleural parenchymal scarring. 6. Chronic ethmoid and frontal sinusitis. 7. Periventricular white matter and corona radiata hypodensities favor chronic ischemic microvascular white matter  disease. Electronically Signed   By: Gaylyn Rong M.D.   On: 02/28/2016 22:01   Ct Cervical Spine Wo Contrast  02/28/2016  CLINICAL DATA:  Fall. Recent cough and fever. Syncopal episode tonight. EXAM: CT HEAD WITHOUT CONTRAST CT CERVICAL SPINE WITHOUT CONTRAST TECHNIQUE: Multidetector CT imaging of the head and cervical spine was performed following the standard protocol without intravenous contrast. Multiplanar CT image reconstructions of the cervical spine were also generated. COMPARISON:  12/16/2013 FINDINGS: CT HEAD FINDINGS The brainstem, cerebellum, cerebral peduncles, thalami, basal ganglia, basilar cisterns, and ventricular system appear within normal limits. Periventricular white matter and corona radiata hypodensities favor chronic ischemic microvascular white matter disease. No intracranial hemorrhage, mass lesion, or acute CVA. Chronic ethmoid and frontal sinusitis. CT CERVICAL SPINE FINDINGS Despite efforts by the technologist and patient, motion artifact is present on today's exam and could not be eliminated. This reduces exam sensitivity and specificity. Fused facet joints on the left at C4-5. Loss of intervertebral disc height at C5-6 and C6-7. 2 mm degenerative grade 1 anterolisthesis at C4-5. Posterior  osseous ridging and uncinate spurring at C5-6 and C6-7, causing moderate left osseous foraminal stenosis at C5-6, moderate right foraminal stenosis at C6-7, and mild right foraminal stenosis at C5-6. No prevertebral soft tissue swelling. No cervical spine fracture identified. Degenerative right sternoclavicular arthropathy. Biapical pleural parenchymal scarring. IMPRESSION: 1. No acute intracranial findings and no acute cervical spine findings. 2. Cervical spondylosis causing foraminal impingement at C5-6 and C6-7. 3. 2 mm degenerative grade 1 anterolisthesis at C4-5. 4. Degenerative right sternoclavicular arthropathy. 5. Biapical pleural parenchymal scarring. 6. Chronic ethmoid and frontal sinusitis. 7. Periventricular white matter and corona radiata hypodensities favor chronic ischemic microvascular white matter disease. Electronically Signed   By: Gaylyn Rong M.D.   On: 02/28/2016 22:01    EKG: Independently reviewed.  Assessment/Plan Principal Problem:   CAP (community acquired pneumonia) Active Problems:   Parkinson disease (HCC)   Dementia due to Parkinson's disease with behavioral disturbance (HCC)   Altered mental status   Right lower lobe pneumonia  CAP, RLL PNA possibly aspiration PNA -  Rocephin and azithromycin  PNA pathway  Cultures pending  NPO except sips with meds until SLP can evaluate due to concern for possible aspiration  Tylenol PRN fever  AMS - Delirium due to #1 on top of chronic parkinson's dementia  Parkinson's disease - continue home meds including Sinemet    DVT prophylaxis: Lovenox Code Status: Full Family Communication: Wife at bedside Consults called: None Admission status: Admit to inpatient   Hillary Bow DO [facility] Pager [phone] from 7PM-7AM  If 7AM-7PM, please contact the day physician for the patient [email or web address] Password [email or web address]  02/29/2016, 2:51 AM

## 2016-02-29 NOTE — Progress Notes (Signed)
IV access delayed waiting on anti-psychotic medications to take effect.  Patient was very restless and thrashing in bed.  He pulled previous IV out.  MD and family aware.

## 2016-02-29 NOTE — Progress Notes (Signed)
PROGRESS NOTE    Wesley Pierce  ZOX:[id number] DOB: 1931/02/02 DOA: 02/28/2016 PCP: Minda Meo, MD    Brief Narrative: Wesley Pierce is a pleasant 82 year old gentleman with a past medical history of Parkinson's disease, dementia, residing in the community who was admitted early this morning presenting with complaints of cough and shortness of breath. Family members thought he may have had an aspiration event at home. Found to have a fever 101.4 in the emergency department. He was started on empiric IV antibiotic therapy with ceftriaxone and azithromycin for presumed community acquire pneumonia. This morning he became severely agitated and was given Haldol and Ativan.    Assessment & Plan:   Principal Problem:   CAP (community acquired pneumonia) Active Problems:   Parkinson disease (HCC)   Dementia due to Parkinson's disease with behavioral disturbance (HCC)   Altered mental status   Right lower lobe pneumonia   1.  Community acquire pneumonia vs Aspiration PNA -Wesley Pierce having a history of Parkinson's disease, presenting with complaints of cough, sputum production, shortness of breath having a fever of 101.4. -He has a risk for aspiration given history of Parkinson's disease, correlated with family reports that he had a choking episode at home.  -Chest x-ray showed calcification at right lung base, could not rule out infiltrate in this area. -Will cover for possible aspiration pneumonia with IV Zosyn, discontinuing ceftriaxone. -Follow-up on blood cultures  2.  Acute delirium. -This morning patient becoming highly agitated, during my evaluation picking at his blanket, confused, unable to provide history, at times thrashing about and attempting to get out of bed. -Infection and hospitalization likely precipitating delirium. -Benzodiazepine therapy in this setting may worsen agitation, this would not give any more IV Ativan. -I think we should also avoid physical  restraints. -Family members are present at bedside which is helpful. Attempt nonpharmacologic approaches.  3.  Parkinson's disease. -It appears he has a history of Parkinson's disease with Parkinson's dementia -Continue Sinemet 25/100 one tablet by mouth 3 times a day.  4.  Possible aspiration. -Plan to consult SLP once agitation improves   DVT prophylaxis: Lovenox Code Status:  Full code Family Communication: I spoke to several family members at bedside Disposition Plan:    Consultants:   SLP  Antimicrobials:  IV Zosyn started on 02/29/2016  Azithromycin started on 02/29/2016    Subjective: He is agitated, confused, significant mental status changes. Unable to provide history.  Objective: Filed Vitals:   02/29/16 0116 02/29/16 0137 02/29/16 0238 02/29/16 0650  BP:  115/90 133/64 129/91  Pulse:  80 92   Temp: 99.9 F (37.7 C)  98 F (36.7 C)   TempSrc: Rectal  Oral   Resp:  16 20 26   Height:      Weight:      SpO2:  97% 94% 100%   No intake or output data in the 24 hours ending 02/29/16 0920 Filed Weights   02/28/16 2030  Weight: 68.04 kg (150 lb)    Examination:  General exam: He is agitated, picking at his blanket, trying to get out of bed Respiratory system: Clear to auscultation. Respiratory effort normal. Cardiovascular system: S1 & S2 heard, RRR. No JVD, murmurs, rubs, gallops or clicks. No pedal edema. Gastrointestinal system: Abdomen is nondistended, soft and nontender. No organomegaly or masses felt. Normal bowel sounds heard. Central nervous system: Alert and oriented. No focal neurological deficits. Extremities: Symmetric 5 x 5 power. Skin: No rashes, lesions or ulcers Psychiatry: Significantly agitated  Data Reviewed: I have personally reviewed following labs and imaging studies  CBC:  Recent Labs Lab 02/28/16 2154  WBC 9.3  NEUTROABS 8.3*  HGB 12.8*  HCT 37.1*  MCV 86.9  PLT 135*   Basic Metabolic Panel:  Recent Labs Lab  02/28/16 2154  NA 130*  K 3.9  CL 99*  CO2 23  GLUCOSE 130*  BUN 19  CREATININE 0.74  CALCIUM 8.5*   GFR: Estimated Creatinine Clearance: 63.1 mL/min (by C-G formula based on Cr of 0.74). Liver Function Tests:  Recent Labs Lab 02/28/16 2154  AST 38  ALT 21  ALKPHOS 69  BILITOT 1.1  PROT 6.4*  ALBUMIN 3.9   No results for input(s): LIPASE, AMYLASE in the last 168 hours. No results for input(s): AMMONIA in the last 168 hours. Coagulation Profile: No results for input(s): INR, PROTIME in the last 168 hours. Cardiac Enzymes: No results for input(s): CKTOTAL, CKMB, CKMBINDEX, TROPONINI in the last 168 hours. BNP (last 3 results) No results for input(s): PROBNP in the last 8760 hours. HbA1C: No results for input(s): HGBA1C in the last 72 hours. CBG: No results for input(s): GLUCAP in the last 168 hours. Lipid Profile: No results for input(s): CHOL, HDL, LDLCALC, TRIG, CHOLHDL, LDLDIRECT in the last 72 hours. Thyroid Function Tests: No results for input(s): TSH, T4TOTAL, FREET4, T3FREE, THYROIDAB in the last 72 hours. Anemia Panel: No results for input(s): VITAMINB12, FOLATE, FERRITIN, TIBC, IRON, RETICCTPCT in the last 72 hours. Sepsis Labs:  Recent Labs Lab 02/29/16 0001  LATICACIDVEN 1.42    No results found for this or any previous visit (from the past 240 hour(s)).       Radiology Studies: Dg Chest 2 View  02/28/2016  CLINICAL DATA:  Cough and fever EXAM: CHEST  2 VIEW COMPARISON:  06/18/2012 FINDINGS: Extensive calcified pleural thickening in the right lung base unchanged. Underlying right lung base not well evaluated due to extensive calcified pleural thickening. Mild pleural thickening on the left unchanged. Cardiac enlargement without heart failure. IMPRESSION: Extensive chronic calcified pleural thickening in the right lung base and mild pleural scarring on the left. No superimposed acute abnormality. No change from 2013. Electronically Signed   By:  Marlan Palauharles  Clark M.D.   On: 02/28/2016 21:08   Ct Head Wo Contrast  02/28/2016  CLINICAL DATA:  Fall. Recent cough and fever. Syncopal episode tonight. EXAM: CT HEAD WITHOUT CONTRAST CT CERVICAL SPINE WITHOUT CONTRAST TECHNIQUE: Multidetector CT imaging of the head and cervical spine was performed following the standard protocol without intravenous contrast. Multiplanar CT image reconstructions of the cervical spine were also generated. COMPARISON:  12/16/2013 FINDINGS: CT HEAD FINDINGS The brainstem, cerebellum, cerebral peduncles, thalami, basal ganglia, basilar cisterns, and ventricular system appear within normal limits. Periventricular white matter and corona radiata hypodensities favor chronic ischemic microvascular white matter disease. No intracranial hemorrhage, mass lesion, or acute CVA. Chronic ethmoid and frontal sinusitis. CT CERVICAL SPINE FINDINGS Despite efforts by the technologist and patient, motion artifact is present on today's exam and could not be eliminated. This reduces exam sensitivity and specificity. Fused facet joints on the left at C4-5. Loss of intervertebral disc height at C5-6 and C6-7. 2 mm degenerative grade 1 anterolisthesis at C4-5. Posterior osseous ridging and uncinate spurring at C5-6 and C6-7, causing moderate left osseous foraminal stenosis at C5-6, moderate right foraminal stenosis at C6-7, and mild right foraminal stenosis at C5-6. No prevertebral soft tissue swelling. No cervical spine fracture identified. Degenerative right sternoclavicular arthropathy. Biapical pleural  parenchymal scarring. IMPRESSION: 1. No acute intracranial findings and no acute cervical spine findings. 2. Cervical spondylosis causing foraminal impingement at C5-6 and C6-7. 3. 2 mm degenerative grade 1 anterolisthesis at C4-5. 4. Degenerative right sternoclavicular arthropathy. 5. Biapical pleural parenchymal scarring. 6. Chronic ethmoid and frontal sinusitis. 7. Periventricular white matter and  corona radiata hypodensities favor chronic ischemic microvascular white matter disease. Electronically Signed   By: Gaylyn Rong M.D.   On: 02/28/2016 22:01   Ct Cervical Spine Wo Contrast  02/28/2016  CLINICAL DATA:  Fall. Recent cough and fever. Syncopal episode tonight. EXAM: CT HEAD WITHOUT CONTRAST CT CERVICAL SPINE WITHOUT CONTRAST TECHNIQUE: Multidetector CT imaging of the head and cervical spine was performed following the standard protocol without intravenous contrast. Multiplanar CT image reconstructions of the cervical spine were also generated. COMPARISON:  12/16/2013 FINDINGS: CT HEAD FINDINGS The brainstem, cerebellum, cerebral peduncles, thalami, basal ganglia, basilar cisterns, and ventricular system appear within normal limits. Periventricular white matter and corona radiata hypodensities favor chronic ischemic microvascular white matter disease. No intracranial hemorrhage, mass lesion, or acute CVA. Chronic ethmoid and frontal sinusitis. CT CERVICAL SPINE FINDINGS Despite efforts by the technologist and patient, motion artifact is present on today's exam and could not be eliminated. This reduces exam sensitivity and specificity. Fused facet joints on the left at C4-5. Loss of intervertebral disc height at C5-6 and C6-7. 2 mm degenerative grade 1 anterolisthesis at C4-5. Posterior osseous ridging and uncinate spurring at C5-6 and C6-7, causing moderate left osseous foraminal stenosis at C5-6, moderate right foraminal stenosis at C6-7, and mild right foraminal stenosis at C5-6. No prevertebral soft tissue swelling. No cervical spine fracture identified. Degenerative right sternoclavicular arthropathy. Biapical pleural parenchymal scarring. IMPRESSION: 1. No acute intracranial findings and no acute cervical spine findings. 2. Cervical spondylosis causing foraminal impingement at C5-6 and C6-7. 3. 2 mm degenerative grade 1 anterolisthesis at C4-5. 4. Degenerative right sternoclavicular  arthropathy. 5. Biapical pleural parenchymal scarring. 6. Chronic ethmoid and frontal sinusitis. 7. Periventricular white matter and corona radiata hypodensities favor chronic ischemic microvascular white matter disease. Electronically Signed   By: Gaylyn Rong M.D.   On: 02/28/2016 22:01        Scheduled Meds: . arformoterol  15 mcg Nebulization BID  . aspirin EC  81 mg Oral Daily  . azithromycin  500 mg Intravenous QHS  . carbidopa-levodopa  1 tablet Oral TID  . cefTRIAXone (ROCEPHIN)  IV  1 g Intravenous QHS  . enoxaparin (LOVENOX) injection  40 mg Subcutaneous Q24H  . fluticasone  2 spray Each Nare Daily  . gabapentin  300 mg Oral QHS  . QUEtiapine  75 mg Oral QHS  . rivastigmine  4.6 mg Transdermal Q0600  . Vortioxetine HBr  10 mg Oral q morning - 10a   Continuous Infusions:    LOS: 0 days    Time spent:     Jeralyn Bennett, MD [facility] Pager [phone]  If 7PM-7AM, please contact night-coverage [email or web address] Password [email or web address] 02/29/2016, 9:20 AM

## 2016-02-29 NOTE — Progress Notes (Signed)
Rapid Response called at 0640 secondary to persistent psychomotor agitation(0200-present) despite multiple prn meds. VSS afebrile diaphoretic. Seroquel given at 0700.  No further orders noted sitter at beside

## 2016-02-29 NOTE — Progress Notes (Signed)
SLP Cancellation Note  Patient Details Name: Wesley FrayCharles E Bagby MRN: [id number] DOB: 1931/02/14   Cancelled treatment:       Reason Eval/Treat Not Completed: Fatigue/lethargy limiting ability to participate. Chart reviewed and history taken, consulted with MD. Pt currently restless and confused, and is inappropriate for evaluation of swallow function and safety for po intake at this time. Will continue efforts.  Leigh AuroraBueche, Novaleigh Kohlman Brown 02/29/2016, 9:46 AM  Harlow Asaelia B. Murvin NatalBueche, Phillips County HospitalMSP, CCC-SLP [phone]

## 2016-02-29 NOTE — Care Management Note (Signed)
Case Management Note  Patient Details  Name: Wesley Pierce MRN: [id number] Date of Birth: 1930-12-13  Subjective/Objective:           rll pna and sepsis         Action/Plan:Date:  Feb 29, 2016 Chart reviewed for concurrent status and case management needs. Will continue to follow the patient for changes and needs: Expected discharge date: [id number] Marcelle SmilingRhonda Davis, BSN, [place] IsleRN3, [place]CCM   [phone]  Expected Discharge Date:                  Expected Discharge Plan:  Home/Self Care  In-House Referral:  NA  Discharge planning Services  CM Consult  Post Acute Care Choice:  NA Choice offered to:  NA  DME Arranged:  N/A DME Agency:  NA  HH Arranged:    HH Agency:     Status of Service:  In process, will continue to follow  Medicare Important Message Given:    Date Medicare IM Given:    Medicare IM give by:    Date Additional Medicare IM Given:    Additional Medicare Important Message give by:     If discussed at Long Length of Stay Meetings, dates discussed:    Additional Comments:  Golda AcreDavis, Rhonda Lynn, RN 02/29/2016, 9:58 AM

## 2016-03-01 DIAGNOSIS — G20 Parkinson's disease: Secondary | ICD-10-CM

## 2016-03-01 DIAGNOSIS — J189 Pneumonia, unspecified organism: Secondary | ICD-10-CM

## 2016-03-01 DIAGNOSIS — F0281 Dementia in other diseases classified elsewhere with behavioral disturbance: Secondary | ICD-10-CM

## 2016-03-01 LAB — CBC
HEMATOCRIT: 38.6 % — AB (ref 39.0–52.0)
Hemoglobin: 13.5 g/dL (ref 13.0–17.0)
MCH: 30.2 pg (ref 26.0–34.0)
MCHC: 35 g/dL (ref 30.0–36.0)
MCV: 86.4 fL (ref 78.0–100.0)
Platelets: 103 10*3/uL — ABNORMAL LOW (ref 150–400)
RBC: 4.47 MIL/uL (ref 4.22–5.81)
RDW: 13.1 % (ref 11.5–15.5)
WBC: 6.8 10*3/uL (ref 4.0–10.5)

## 2016-03-01 LAB — BASIC METABOLIC PANEL
Anion gap: 9 (ref 5–15)
BUN: 19 mg/dL (ref 6–20)
CHLORIDE: 101 mmol/L (ref 101–111)
CO2: 22 mmol/L (ref 22–32)
Calcium: 8.1 mg/dL — ABNORMAL LOW (ref 8.9–10.3)
Creatinine, Ser: 0.81 mg/dL (ref 0.61–1.24)
GFR calc Af Amer: >60 mL/min (ref >60)
GFR calc non Af Amer: >60 mL/min (ref >60)
GLUCOSE: 86 mg/dL (ref 65–99)
POTASSIUM: 4.1 mmol/L (ref 3.5–5.1)
Sodium: 132 mmol/L — ABNORMAL LOW (ref 135–145)

## 2016-03-01 LAB — URINE CULTURE: CULTURE: NO GROWTH

## 2016-03-01 MED ORDER — RESOURCE THICKENUP CLEAR PO POWD
ORAL | Status: DC | PRN
  Filled 2016-03-01: qty 125

## 2016-03-01 MED ORDER — ROPINIROLE HCL 1 MG PO TABS
1.0000 mg | ORAL_TABLET | Freq: Every day | ORAL | Status: DC
  Administered 2016-03-01 – 2016-03-02 (×2): 1 mg via ORAL
  Filled 2016-03-01 (×3): qty 1

## 2016-03-01 MED ORDER — MORPHINE SULFATE (PF) 2 MG/ML IV SOLN
2.0000 mg | Freq: Once | INTRAVENOUS | Status: AC
  Administered 2016-03-01: 2 mg via INTRAVENOUS
  Filled 2016-03-01: qty 1

## 2016-03-01 NOTE — Progress Notes (Signed)
PROGRESS NOTE    Wesley Pierce  ZOX:[id number] DOB: 11/10/1930 DOA: 02/28/2016 PCP: Minda Meo, MD    Brief Narrative: Wesley Pierce is a pleasant 82 year old gentleman with a past medical history of Parkinson's disease, dementia, residing in the community who was admitted early this morning presenting with complaints of cough and shortness of breath. Family members thought he may have had an aspiration event at home. Found to have a fever 101.4 in the emergency department. He was started on empiric IV antibiotic therapy with ceftriaxone and azithromycin for presumed community acquire pneumonia. This morning he became severely agitated and was given Haldol and Ativan.    Assessment & Plan:   1.  Community acquire pneumonia vs Aspiration PNA -Wesley Pierce having a history of Parkinson's disease, presenting with complaints of cough, sputum production, shortness of breath having a fever of 101.4. -He has a risk for aspiration given history of Parkinson's disease, correlated with family reports that he had a choking episode at home.  -Chest x-ray showed chronic scarring/ calcification  at right lung base, could not rule out infiltrate in this area. -continued to cover for possible aspiration pneumonia with IV Zosyn and atypical coverage for     2.  Acute delirium. - confused and intermittently agitated  -Infection and hospitalization likely precipitating delirium. -Benzodiazepine therapy in this setting may worsen agitation, this would not give any more IV Ativan. -Family members are present at bedside which is helpful. Attempt nonpharmacologic approaches.  3.  Parkinson's disease. -It appears he has a history of Parkinson's disease with Parkinson's dementia -Continue Sinemet 25/100 one tablet by mouth 3 times a day.  4.  Possible aspiration. - nectar thick liquids recommended by SLP   DVT prophylaxis: Lovenox Code Status:  Full code Family Communication: I spoke to several  family members at bedside Disposition Plan:    Consultants:   SLP  Antimicrobials:  IV Zosyn started on 02/29/2016  Azithromycin started on 02/29/2016    Subjective: He is confused but able to reply appropriately to some questions. Wesley Pierce at bedside confirms appropriate responses. No complaints.  Objective: Filed Vitals:   03/01/16 0244 03/01/16 0248 03/01/16 0828 03/01/16 1357  BP:    144/60  Pulse:    71  Temp:    98.5 F (36.9 C)  TempSrc:    Oral  Resp:    18  Height:      Weight:      SpO2: 90% 100% 100% 100%    Intake/Output Summary (Last 24 hours) at 03/01/16 1513 Last data filed at 03/01/16 1442  Gross per 24 hour  Intake      0 ml  Output    451 ml  Net   -451 ml   Filed Weights   02/28/16 2030  Weight: 68.04 kg (150 lb)    Examination:  General exam: confused but calm Respiratory system: Clear to auscultation. Respiratory effort normal. Cardiovascular system: S1 & S2 heard, RRR. No JVD, murmurs, rubs, gallops or clicks. No pedal edema. Gastrointestinal system: Abdomen is nondistended, soft and nontender. No organomegaly or masses felt. Normal bowel sounds heard. Central nervous system: Alert and oriented. No focal neurological deficits. Extremities: Symmetric 5 x 5 power. Skin: No rashes, lesions or ulcers Psychiatry: Significantly agitated    Data Reviewed: I have personally reviewed following labs and imaging studies  CBC:  Recent Labs Lab 02/28/16 2154 03/01/16 0730  WBC 9.3 6.8  NEUTROABS 8.3*  --   HGB 12.8* 13.5  HCT 37.1*  38.6*  MCV 86.9 86.4  PLT 135* 103*   Basic Metabolic Panel:  Recent Labs Lab 02/28/16 2154 03/01/16 0730  NA 130* 132*  K 3.9 4.1  CL 99* 101  CO2 23 22  GLUCOSE 130* 86  BUN 19 19  CREATININE 0.74 0.81  CALCIUM 8.5* 8.1*   GFR: Estimated Creatinine Clearance: 62.4 mL/min (by C-G formula based on Cr of 0.81). Liver Function Tests:  Recent Labs Lab 02/28/16 2154  AST 38  ALT 21  ALKPHOS 69    BILITOT 1.1  PROT 6.4*  ALBUMIN 3.9   No results for input(s): LIPASE, AMYLASE in the last 168 hours. No results for input(s): AMMONIA in the last 168 hours. Coagulation Profile: No results for input(s): INR, PROTIME in the last 168 hours. Cardiac Enzymes: No results for input(s): CKTOTAL, CKMB, CKMBINDEX, TROPONINI in the last 168 hours. BNP (last 3 results) No results for input(s): PROBNP in the last 8760 hours. HbA1C: No results for input(s): HGBA1C in the last 72 hours. CBG: No results for input(s): GLUCAP in the last 168 hours. Lipid Profile: No results for input(s): CHOL, HDL, LDLCALC, TRIG, CHOLHDL, LDLDIRECT in the last 72 hours. Thyroid Function Tests: No results for input(s): TSH, T4TOTAL, FREET4, T3FREE, THYROIDAB in the last 72 hours. Anemia Panel: No results for input(s): VITAMINB12, FOLATE, FERRITIN, TIBC, IRON, RETICCTPCT in the last 72 hours. Sepsis Labs:  Recent Labs Lab 02/29/16 0001  LATICACIDVEN 1.42    Recent Results (from the past 240 hour(s))  Urine culture     Status: None   Collection Time: 02/28/16 10:45 PM  Result Value Ref Range Status   Specimen Description URINE, CLEAN CATCH  Final   Special Requests NONE  Final   Culture NO GROWTH Performed at [facility]   Final   Report Status 03/01/2016 FINAL  Final  Culture, blood (routine x 2) Call MD if unable to obtain prior to antibiotics being given     Status: None (Preliminary result)   Collection Time: 02/29/16  5:00 AM  Result Value Ref Range Status   Specimen Description BLOOD RIGHT HAND  Final   Special Requests BOTTLES DRAWN AEROBIC ONLY 5CC  Final   Culture   Final    NO GROWTH 1 DAY Performed at [facility]    Report Status PENDING  Incomplete  Culture, blood (routine x 2) Call MD if unable to obtain prior to antibiotics being given     Status: None (Preliminary result)   Collection Time: 02/29/16  5:02 AM  Result Value Ref Range Status   Specimen Description BLOOD  LEFT ARM  Final   Special Requests BOTTLES DRAWN AEROBIC AND ANAEROBIC 8CC  Final   Culture   Final    NO GROWTH 1 DAY Performed at [facility]    Report Status PENDING  Incomplete  Culture, sputum-assessment     Status: None   Collection Time: 02/29/16  9:10 PM  Result Value Ref Range Status   Specimen Description SPUTUM  Final   Special Requests NONE  Final   Sputum evaluation THIS SPECIMEN IS ACCEPTABLE FOR SPUTUM CULTURE  Final   Report Status 02/29/2016 FINAL  Final  Culture, respiratory (NON-Expectorated)     Status: None (Preliminary result)   Collection Time: 02/29/16  9:10 PM  Result Value Ref Range Status   Specimen Description SPUTUM  Final   Special Requests NONE  Final   Gram Stain   Final    FEW SQUAMOUS EPITHELIAL  CELLS PRESENT FEW WBC PRESENT,BOTH PMN AND MONONUCLEAR RARE GRAM POSITIVE COCCI IN PAIRS    Culture   Final    TOO YOUNG TO READ [facility]formed [facility]    Report Status PENDING  Incomplete         Radiology Studies: Dg Chest 2 View  02/28/2016  CLINICAL DATA:  Cough and fever EXAM: CHEST  2 VIEW COMPARISON:  06/18/2012 FINDINGS: Extensive calcified pleural thickening in the right lung base unchanged. Underlying right lung base not well evaluated due to extensive calcified pleural thickening. Mild pleural thickening on the left unchanged. Cardiac enlargement without heart failure. IMPRESSION: Extensive chronic calcified pleural thickening in the right lung base and mild pleural scarring on the left. No superimposed acute abnormality. No change from 2013. Electronically Signed   By: Marlan Palauharles  Clark M.D.   On: 02/28/2016 21:08   Ct Head Wo Contrast  02/28/2016  CLINICAL DATA:  Fall. Recent cough and fever. Syncopal episode tonight. EXAM: CT HEAD WITHOUT CONTRAST CT CERVICAL SPINE WITHOUT CONTRAST TECHNIQUE: Multidetector CT imaging of the head and cervical spine was performed following the standard protocol without intravenous contrast.  Multiplanar CT image reconstructions of the cervical spine were also generated. COMPARISON:  12/16/2013 FINDINGS: CT HEAD FINDINGS The brainstem, cerebellum, cerebral peduncles, thalami, basal ganglia, basilar cisterns, and ventricular system appear within normal limits. Periventricular white matter and corona radiata hypodensities favor chronic ischemic microvascular white matter disease. No intracranial hemorrhage, mass lesion, or acute CVA. Chronic ethmoid and frontal sinusitis. CT CERVICAL SPINE FINDINGS Despite efforts by the technologist and patient, motion artifact is present on today's exam and could not be eliminated. This reduces exam sensitivity and specificity. Fused facet joints on the left at C4-5. Loss of intervertebral disc height at C5-6 and C6-7. 2 mm degenerative grade 1 anterolisthesis at C4-5. Posterior osseous ridging and uncinate spurring at C5-6 and C6-7, causing moderate left osseous foraminal stenosis at C5-6, moderate right foraminal stenosis at C6-7, and mild right foraminal stenosis at C5-6. No prevertebral soft tissue swelling. No cervical spine fracture identified. Degenerative right sternoclavicular arthropathy. Biapical pleural parenchymal scarring. IMPRESSION: 1. No acute intracranial findings and no acute cervical spine findings. 2. Cervical spondylosis causing foraminal impingement at C5-6 and C6-7. 3. 2 mm degenerative grade 1 anterolisthesis at C4-5. 4. Degenerative right sternoclavicular arthropathy. 5. Biapical pleural parenchymal scarring. 6. Chronic ethmoid and frontal sinusitis. 7. Periventricular white matter and corona radiata hypodensities favor chronic ischemic microvascular white matter disease. Electronically Signed   By: Gaylyn RongWalter  Liebkemann M.D.   On: 02/28/2016 22:01   Ct Cervical Spine Wo Contrast  02/28/2016  CLINICAL DATA:  Fall. Recent cough and fever. Syncopal episode tonight. EXAM: CT HEAD WITHOUT CONTRAST CT CERVICAL SPINE WITHOUT CONTRAST TECHNIQUE:  Multidetector CT imaging of the head and cervical spine was performed following the standard protocol without intravenous contrast. Multiplanar CT image reconstructions of the cervical spine were also generated. COMPARISON:  12/16/2013 FINDINGS: CT HEAD FINDINGS The brainstem, cerebellum, cerebral peduncles, thalami, basal ganglia, basilar cisterns, and ventricular system appear within normal limits. Periventricular white matter and corona radiata hypodensities favor chronic ischemic microvascular white matter disease. No intracranial hemorrhage, mass lesion, or acute CVA. Chronic ethmoid and frontal sinusitis. CT CERVICAL SPINE FINDINGS Despite efforts by the technologist and patient, motion artifact is present on today's exam and could not be eliminated. This reduces exam sensitivity and specificity. Fused facet joints on the left at C4-5. Loss of intervertebral disc height at C5-6 and C6-7. 2 mm  degenerative grade 1 anterolisthesis at C4-5. Posterior osseous ridging and uncinate spurring at C5-6 and C6-7, causing moderate left osseous foraminal stenosis at C5-6, moderate right foraminal stenosis at C6-7, and mild right foraminal stenosis at C5-6. No prevertebral soft tissue swelling. No cervical spine fracture identified. Degenerative right sternoclavicular arthropathy. Biapical pleural parenchymal scarring. IMPRESSION: 1. No acute intracranial findings and no acute cervical spine findings. 2. Cervical spondylosis causing foraminal impingement at C5-6 and C6-7. 3. 2 mm degenerative grade 1 anterolisthesis at C4-5. 4. Degenerative right sternoclavicular arthropathy. 5. Biapical pleural parenchymal scarring. 6. Chronic ethmoid and frontal sinusitis. 7. Periventricular white matter and corona radiata hypodensities favor chronic ischemic microvascular white matter disease. Electronically Signed   By: Gaylyn Rong M.D.   On: 02/28/2016 22:01        Scheduled Meds: . arformoterol  15 mcg Nebulization BID    . aspirin EC  81 mg Oral Daily  . azithromycin  500 mg Intravenous QHS  . carbidopa-levodopa  1 tablet Oral TID  . enoxaparin (LOVENOX) injection  40 mg Subcutaneous Q24H  . fluticasone  2 spray Each Nare Daily  . gabapentin  300 mg Oral QHS  . piperacillin-tazobactam (ZOSYN)  IV  3.375 g Intravenous Q8H  . QUEtiapine  75 mg Oral QHS  . rivastigmine  4.6 mg Transdermal Q0600  . rOPINIRole  1 mg Oral QHS  . vortioxetine HBr  10 mg Oral q morning - 10a   Continuous Infusions:    LOS: 1 day    Time spent:     [facility], MD [facility] Pager [phone]  If 7PM-7AM, please contact night-coverage [email or web address] Password [email or web address] 03/01/2016, 3:13 PM

## 2016-03-01 NOTE — Evaluation (Signed)
Clinical/Bedside Swallow Evaluation Patient Details  Name: Wesley Pierce MRN: [id number] Date of Birth: 1931/04/23  Today's Date: 03/01/2016 Time: SLP Start Time (ACUTE ONLY): 1310 SLP Stop Time (ACUTE ONLY): 1355 SLP Time Calculation (min) (ACUTE ONLY): 45 min  Past Medical History:  Past Medical History  Diagnosis Date  . Osteoarthritis   . Hyperlipidemia   . GERD (gastroesophageal reflux disease)   . Restless legs syndrome (RLS)   . Interstitial lung disease (HCC)   . Pleural thickening   . Hemorrhoids   . Cognitive decline   . Parkinson disease (HCC)   . B12 deficiency   . Memory loss   . Non-rapid eye movement sleep arousal disorder, sleep walking type 04/12/2015   Past Surgical History:  Past Surgical History  Procedure Laterality Date  . Cataract extraction  03/2008  . Lung decortication  1962  . Colonoscopy    . Hemorrhoid surgery  06/21/2012    Procedure: HEMORRHOIDECTOMY;  Surgeon: Clovis Pu. Cornett, MD;  Location: [facility];  Service: General;  Laterality: N/A;  . Examination under anesthesia  08/27/2012    Procedure: EXAM UNDER ANESTHESIA;  Surgeon: Maisie Fus A. Cornett, MD;  Location: [facility];  Service: General;  Laterality: N/A;   HPI:  82 year old male admitted 02/28/16 with cough. Pt was noted to choke/possibly aspirate at home prior to admit. PMH significant for Parkinson's disease, dementia, GERD. CT negative for acute findings, CXR revealed extensive calcified pleural thickening of the RLL, unchanged since 2013.  Wife reports pt with lung deficits which she attributes to pt's woodworking without protective mask.   Assessment / Plan / Recommendation Clinical Impression  Pt presents with symptoms of dysphagia and possible aspiration likely due to his Parkinsonism.  Chronic cough noted - but signficantly worse post=swallow of thin liquids.   Son present and reports pt with cough as "long as he's known him" (x 53 years).  No  indication of severe airway compromise with ice chips, nectar via cup/straw, applesauce and crackers.   Pt does also have baseline cough but SLP can not rule out aspiration.  Recommend MBS next date and modified diet with strict compensation strategies/aspiration risk.  Educated pt/family to dysphagia associated with dementia/parkinsonism and mitigation goals.  Family/pt educated to plan and agreeable.  MBS tentatively scheduled for 1230 pm tomorrow.  Thanks.     Aspiration Risk  Mild aspiration risk    Diet Recommendation Nectar-thick liquid;Ice chips PRN after oral care;Dysphagia 3 (Mech soft)   Liquid Administration via: Straw;Cup Medication Administration: Whole meds with puree Compensations: Slow rate;Small sips/bites Postural Changes: Seated upright at 90 degrees;Remain upright for at least 30 minutes after po intake    Other  Recommendations Oral Care Recommendations: Oral care BID Other Recommendations: Order thickener from pharmacy   Follow up Recommendations   (tbd)    Frequency and Duration min 2x/week  1 week       Prognosis Prognosis for Safe Diet Advancement: Fair Barriers to Reach Goals: Severity of deficits;Time post onset      Swallow Study   General Date of Onset: 02/28/16 HPI: 82 year old male admitted 02/28/16 with cough. Pt was noted to choke/possibly aspirate at home prior to admit. PMH significant for Parkinson's disease, dementia, GERD. CT negative for acute findings, CXR revealed extensive calcified pleural thickening of the RLL, unchanged since 2013. Type of Study: Bedside Swallow Evaluation Previous Swallow Assessment: none found Diet Prior to this Study: NPO Temperature Spikes Noted: No Respiratory  Status: Room air History of Recent Intubation: No Behavior/Cognition: Alert;Cooperative;Pleasant mood Oral Cavity Assessment: Within Functional Limits Oral Care Completed by SLP: Yes Oral Cavity - Dentition: Adequate natural dentition Vision: Functional  for self-feeding Self-Feeding Abilities: Able to feed self Patient Positioning: Upright in bed Baseline Vocal Quality: Normal Volitional Cough: Strong Volitional Swallow: Able to elicit    Oral/Motor/Sensory Function Overall Oral Motor/Sensory Function: Generalized oral weakness   Ice Chips Ice chips: Within functional limits Presentation: Spoon   Thin Liquid Thin Liquid: Impaired Presentation: Cup;Straw Oral Phase Impairments: Reduced lingual movement/coordination Oral Phase Functional Implications: Prolonged oral transit Pharyngeal  Phase Impairments: Suspected delayed Swallow;Cough - Immediate;Cough - Delayed    Nectar Thick Presentation: Cup;Self Fed;Straw   Honey Thick Honey Thick Liquid: Not tested   Puree Puree: Within functional limits Presentation: Self Fed;Spoon   Solid   GO   Solid: Impaired Presentation: Self Fed;Spoon Oral Phase Impairments: Reduced lingual movement/coordination;Poor awareness of bolus Pharyngeal Phase Impairments: Throat Clearing - Delayed        Donavan Burnetamara Jarrod Bodkins, MS [facility] SLP [phone]

## 2016-03-02 ENCOUNTER — Inpatient Hospital Stay (HOSPITAL_COMMUNITY): Payer: Medicare Other

## 2016-03-02 NOTE — Procedures (Signed)
Objective Swallowing Evaluation: Type of Study: MBS-Modified Barium Swallow Study  Patient Details  Name: Wesley Pierce MRN: [id number] Date of Birth: Dec 02, 1930  Today's Date: 03/02/2016 Time: SLP Start Time (ACUTE ONLY): 1240-SLP Stop Time (ACUTE ONLY): 1314 SLP Time Calculation (min) (ACUTE ONLY): 34 min  Past Medical History:  Past Medical History  Diagnosis Date  . Osteoarthritis   . Hyperlipidemia   . GERD (gastroesophageal reflux disease)   . Restless legs syndrome (RLS)   . Interstitial lung disease (HCC)   . Pleural thickening   . Hemorrhoids   . Cognitive decline   . Parkinson disease (HCC)   . B12 deficiency   . Memory loss   . Non-rapid eye movement sleep arousal disorder, sleep walking type 04/12/2015   Past Surgical History:  Past Surgical History  Procedure Laterality Date  . Cataract extraction  03/2008  . Lung decortication  1962  . Colonoscopy    . Hemorrhoid surgery  06/21/2012    Procedure: HEMORRHOIDECTOMY;  Surgeon: Clovis Pu. Cornett, MD;  Location: [facility];  Service: General;  Laterality: N/A;  . Examination under anesthesia  08/27/2012    Procedure: EXAM UNDER ANESTHESIA;  Surgeon: Maisie Fus A. Cornett, MD;  Location: [facility];  Service: General;  Laterality: N/A;   HPI: 82 year old male admitted 02/28/16 with cough. Pt was noted to choke/possibly aspirate at home prior to admit. PMH significant for Parkinson's disease, dementia, GERD. CT negative for acute findings, CXR revealed extensive calcified pleural thickening of the RLL, unchanged since 2013.  Subjective: pt awake in chair  Assessment / Plan / Recommendation  CHL IP CLINICAL IMPRESSIONS 03/02/2016  Therapy Diagnosis Moderate oral phase dysphagia;Mild pharyngeal phase dysphagia  Clinical Impression Moderate oral and mild pharyngeal dysphagia characteristic of dysphagia with Parkinsonism.  Decreased oral coordination and cohesion noted with decreased bolus  propulsion.  Piecemealing noted inconsistently across liquids and pudding.  Pt did not orally transit barium tablet with thin and was expectorated by pt.  With pill swallow with thin, pt with gross aspiration of thin due to premature spillage into open airway.  Aspiration did results in significant coughing but barium was not fully cleared  Minimal inconsistent pharyngeal swallow delay resulted in penetration of nectar to cords - silently.  Throat clearing removed penetrates.  Suspect pt has episodic aspiration due to his dysphagia from Parkinsonism.  Mitigation strategies discussed and reinforced using live monitor for feedback.  SlP recommends to continue nectar and ice chips for now given pt's dysphagia and acute illness.  Wife agreeable to plan.    Impact on safety and function Mild aspiration risk      CHL IP TREATMENT RECOMMENDATION 03/02/2016  Treatment Recommendations Therapy as outlined in treatment plan below     Prognosis 03/02/2016  Prognosis for Safe Diet Advancement Fair  Barriers to Reach Goals Severity of deficits;Time post onset  Barriers/Prognosis Comment --    CHL IP DIET RECOMMENDATION 03/02/2016  SLP Diet Recommendations Dysphagia 3 (Mech soft) solids;Nectar thick liquid;Other (Comment)  Liquid Administration via Cup  Medication Administration Crushed with puree  Compensations Slow rate;Small sips/bites  Postural Changes --      CHL IP OTHER RECOMMENDATIONS 03/02/2016  Recommended Consults --  Oral Care Recommendations Oral care BID  Other Recommendations --      CHL IP FOLLOW UP RECOMMENDATIONS 03/01/2016  Follow up Recommendations (No Data)      CHL IP FREQUENCY AND DURATION 03/02/2016  Speech Therapy Frequency (ACUTE ONLY) min 2x/week  Treatment Duration 1 week           CHL IP ORAL PHASE 03/02/2016  Oral Phase Impaired  Oral - Pudding Teaspoon --  Oral - Pudding Cup --  Oral - Honey Teaspoon --  Oral - Honey Cup --  Oral - Nectar Teaspoon Weak lingual  manipulation;Reduced posterior propulsion  Oral - Nectar Cup Weak lingual manipulation;Lingual pumping;Reduced posterior propulsion  Oral - Nectar Straw --  Oral - Thin Teaspoon Weak lingual manipulation;Lingual pumping;Reduced posterior propulsion  Oral - Thin Cup Weak lingual manipulation;Lingual pumping;Reduced posterior propulsion;Premature spillage  Oral - Thin Straw Weak lingual manipulation;Lingual pumping;Reduced posterior propulsion;Premature spillage;Decreased bolus cohesion  Oral - Puree Weak lingual manipulation;Lingual pumping;Reduced posterior propulsion;Piecemeal swallowing  Oral - Mech Soft --  Oral - Regular Weak lingual manipulation;Lingual pumping;Reduced posterior propulsion  Oral - Multi-Consistency --  Oral - Pill Weak lingual manipulation;Lingual pumping;Reduced posterior propulsion;Delayed oral transit;Decreased bolus cohesion;Premature spillage  Oral Phase - Comment pt aspirated large amount of thin via cup when taking barium tablet    CHL IP PHARYNGEAL PHASE 03/02/2016  Pharyngeal Phase Impaired  Pharyngeal- Pudding Teaspoon --  Pharyngeal --  Pharyngeal- Pudding Cup --  Pharyngeal --  Pharyngeal- Honey Teaspoon --  Pharyngeal --  Pharyngeal- Honey Cup --  Pharyngeal --  Pharyngeal- Nectar Teaspoon --  Pharyngeal --  Pharyngeal- Nectar Cup Delayed swallow initiation-vallecula;Penetration/Aspiration during swallow  Pharyngeal Material enters airway, CONTACTS cords and not ejected out  Pharyngeal- Nectar Straw --  Pharyngeal --  Pharyngeal- Thin Teaspoon Delayed swallow initiation-vallecula  Pharyngeal --  Pharyngeal- Thin Cup --  Pharyngeal --  Pharyngeal- Thin Straw Penetration/Aspiration before swallow  Pharyngeal Material enters airway, passes BELOW cords and not ejected out despite cough attempt by patient  Pharyngeal- Puree Delayed swallow initiation-vallecula  Pharyngeal --  Pharyngeal- Mechanical Soft --  Pharyngeal --  Pharyngeal- Regular Delayed  swallow initiation-vallecula  Pharyngeal --  Pharyngeal- Multi-consistency --  Pharyngeal --  Pharyngeal- Pill --  Pharyngeal --  Pharyngeal Comment gross aspiration of thin when taking barium tablet     CHL IP CERVICAL ESOPHAGEAL PHASE 03/02/2016  Cervical Esophageal Phase WFL  Pudding Teaspoon --  Pudding Cup --  Honey Teaspoon --  Honey Cup --  Nectar Teaspoon --  Nectar Cup --  Nectar Straw --  Thin Teaspoon --  Thin Cup --  Thin Straw --  Puree --  Mechanical Soft --  Regular --  Multi-consistency --  Pill --  Cervical Esophageal Comment esophageal sweep x2 pt appeared with adequate clearance    No flowsheet data found.  Donavan Burnetamara Ottie Tillery, MS [facility] SLP [phone]

## 2016-03-02 NOTE — Progress Notes (Signed)
PROGRESS NOTE    Wesley Pierce  ZOX:[id number] DOB: 01/24/1931 DOA: 02/28/2016 PCP: Minda Meo, MD    Brief Narrative: Wesley Pierce is a pleasant 82 year old gentleman with a past medical history of Parkinson's disease, dementia, residing in the community who was admitted early this morning presenting with complaints of cough and shortness of breath. Family members thought he may have had an aspiration event at home. Found to have a fever 101.4 in the emergency department. He was started on empiric IV antibiotic therapy with ceftriaxone and azithromycin for presumed community acquire pneumonia. This morning he became severely agitated and was given Haldol and Ativan.    Assessment & Plan:   1.  Community acquire pneumonia vs Aspiration PNA -Wesley Safi has a history of Parkinson's disease, presenting with complaints of cough, sputum production, shortness of breath & a fever of 101.4. -He has a risk for aspiration given history of Parkinson's disease- correlated with family reports that he had a choking episode at home.  -Chest x-ray showed chronic scarring/ calcification  at right lung base, could not rule out infiltrate in this area. -continued to cover for possible aspiration pneumonia with IV Zosyn and for atypical coverage with zithromax - fever 102 yesterday- cont to follow- pulse ox stable on room air    2.  Acute delirium. - confused and intermittently agitated  -Infection and hospitalization likely precipitating delirium. -Benzodiazepine therapy in this setting may worsen agitation, this would not give any more IV Ativan. -Family members are present at bedside which is helpful. Attempt nonpharmacologic approaches.  3.  Parkinson's disease. -It appears he has a history of Parkinson's disease with Parkinson's dementia -Continue Sinemet 25/100 one tablet by mouth 3 times a day.  4.  Possible aspiration. - nectar thick liquids recommended by SLP   DVT prophylaxis:  Lovenox Code Status:  Full code Family Communication: I spoke to several family members at bedside Disposition Plan:    Consultants:   SLP  Antimicrobials:  IV Zosyn started on 02/29/2016  Azithromycin started on 02/29/2016    Subjective: He is confused but able to reply appropriately to some questions. Son at bedside confirms appropriate responses. No complaints.  Objective: Filed Vitals:   03/01/16 2152 03/02/16 0631 03/02/16 1028 03/02/16 1442  BP: 125/106 164/73  146/64  Pulse: 68 87  73  Temp: 98.4 F (36.9 C) 99.6 F (37.6 C)  98.8 F (37.1 C)  TempSrc: Oral Axillary  Oral  Resp: 16 20  18   Height:      Weight:      SpO2: 99% 94% 94% 97%    Intake/Output Summary (Last 24 hours) at 03/02/16 1544 Last data filed at 03/02/16 0622  Gross per 24 hour  Intake    220 ml  Output   1200 ml  Net   -980 ml   Filed Weights   02/28/16 2030  Weight: 68.04 kg (150 lb)    Examination:  General exam: confused but calm Respiratory system: Clear to auscultation. Respiratory effort normal. Cardiovascular system: S1 & S2 heard, RRR. No JVD, murmurs, rubs, gallops or clicks. No pedal edema. Gastrointestinal system: Abdomen is nondistended, soft and nontender. No organomegaly or masses felt. Normal bowel sounds heard. Central nervous system: Alert and oriented. No focal neurological deficits. Extremities: Symmetric 5 x 5 power. Skin: No rashes, lesions or ulcers Psychiatry: Significantly agitated    Data Reviewed: I have personally reviewed following labs and imaging studies  CBC:  Recent Labs Lab 02/28/16 2154 03/01/16 0730  WBC 9.3 6.8  NEUTROABS 8.3*  --   HGB 12.8* 13.5  HCT 37.1* 38.6*  MCV 86.9 86.4  PLT 135* 103*   Basic Metabolic Panel:  Recent Labs Lab 02/28/16 2154 03/01/16 0730  NA 130* 132*  K 3.9 4.1  CL 99* 101  CO2 23 22  GLUCOSE 130* 86  BUN 19 19  CREATININE 0.74 0.81  CALCIUM 8.5* 8.1*   GFR: Estimated Creatinine Clearance:  62.4 mL/min (by C-G formula based on Cr of 0.81). Liver Function Tests:  Recent Labs Lab 02/28/16 2154  AST 38  ALT 21  ALKPHOS 69  BILITOT 1.1  PROT 6.4*  ALBUMIN 3.9   No results for input(s): LIPASE, AMYLASE in the last 168 hours. No results for input(s): AMMONIA in the last 168 hours. Coagulation Profile: No results for input(s): INR, PROTIME in the last 168 hours. Cardiac Enzymes: No results for input(s): CKTOTAL, CKMB, CKMBINDEX, TROPONINI in the last 168 hours. BNP (last 3 results) No results for input(s): PROBNP in the last 8760 hours. HbA1C: No results for input(s): HGBA1C in the last 72 hours. CBG: No results for input(s): GLUCAP in the last 168 hours. Lipid Profile: No results for input(s): CHOL, HDL, LDLCALC, TRIG, CHOLHDL, LDLDIRECT in the last 72 hours. Thyroid Function Tests: No results for input(s): TSH, T4TOTAL, FREET4, T3FREE, THYROIDAB in the last 72 hours. Anemia Panel: No results for input(s): VITAMINB12, FOLATE, FERRITIN, TIBC, IRON, RETICCTPCT in the last 72 hours. Sepsis Labs:  Recent Labs Lab 02/29/16 0001  LATICACIDVEN 1.42    Recent Results (from the past 240 hour(s))  Urine culture     Status: None   Collection Time: 02/28/16 10:45 PM  Result Value Ref Range Status   Specimen Description URINE, CLEAN CATCH  Final   Special Requests NONE  Final   Culture NO GROWTH Performed [facility]   Final   Report Status 03/01/2016 FINAL  Final  Culture, blood (routine x 2) Call MD if unable to obtain prior to antibiotics being given     Status: None (Preliminary result)   Collection Time: 02/29/16  5:00 AM  Result Value Ref Range Status   Specimen Description BLOOD RIGHT HAND  Final   Special Requests BOTTLES DRAWN AEROBIC ONLY 5CC  Final   Culture   Final    NO GROWTH 1 DAY Performed at [facility]    Report Status PENDING  Incomplete  Culture, blood (routine x 2) Call MD if unable to obtain prior to antibiotics being  given     Status: None (Preliminary result)   Collection Time: 02/29/16  5:02 AM  Result Value Ref Range Status   Specimen Description BLOOD LEFT ARM  Final   Special Requests BOTTLES DRAWN AEROBIC AND ANAEROBIC 8CC  Final   Culture   Final    NO GROWTH 1 DAY Performed at [facility]    Report Status PENDING  Incomplete  Culture, sputum-assessment     Status: None   Collection Time: 02/29/16  9:10 PM  Result Value Ref Range Status   Specimen Description SPUTUM  Final   Special Requests NONE  Final   Sputum evaluation THIS SPECIMEN IS ACCEPTABLE FOR SPUTUM CULTURE  Final   Report Status 02/29/2016 FINAL  Final  Culture, respiratory (NON-Expectorated)     Status: None (Preliminary result)   Collection Time: 02/29/16  9:10 PM  Result Value Ref Range Status   Specimen Description SPUTUM  Final   Special Requests NONE  Final   Gram Stain   Final    FEW SQUAMOUS EPITHELIAL CELLS PRESENT FEW WBC PRESENT,BOTH PMN AND MONONUCLEAR RARE GRAM POSITIVE COCCI IN PAIRS    Culture   Final    CULTURE REINCUBATED FOR BETTER GROWTH Performed at [facility]    Report Status PENDING  Incomplete         Radiology Studies: No results found.      Scheduled Meds: . arformoterol  15 mcg Nebulization BID  . aspirin EC  81 mg Oral Daily  . azithromycin  500 mg Intravenous QHS  . carbidopa-levodopa  1 tablet Oral TID  . enoxaparin (LOVENOX) injection  40 mg Subcutaneous Q24H  . fluticasone  2 spray Each Nare Daily  . gabapentin  300 mg Oral QHS  . piperacillin-tazobactam (ZOSYN)  IV  3.375 g Intravenous Q8H  . QUEtiapine  75 mg Oral QHS  . rivastigmine  4.6 mg Transdermal Q0600  . rOPINIRole  1 mg Oral QHS  . vortioxetine HBr  10 mg Oral q morning - 10a   Continuous Infusions:    LOS: 2 days    Time spent:     [facility], MD [facility] Pager [phone]  If 7PM-7AM, please contact night-coverage [email or web address] Password [facility] 03/02/2016, 3:44 PM

## 2016-03-03 DIAGNOSIS — R1310 Dysphagia, unspecified: Secondary | ICD-10-CM

## 2016-03-03 LAB — CULTURE, RESPIRATORY W GRAM STAIN: Culture: NORMAL

## 2016-03-03 LAB — BASIC METABOLIC PANEL
Anion gap: 8 (ref 5–15)
BUN: 14 mg/dL (ref 6–20)
CALCIUM: 7.8 mg/dL — AB (ref 8.9–10.3)
CO2: 26 mmol/L (ref 22–32)
CREATININE: 0.6 mg/dL — AB (ref 0.61–1.24)
Chloride: 99 mmol/L — ABNORMAL LOW (ref 101–111)
Glucose, Bld: 100 mg/dL — ABNORMAL HIGH (ref 65–99)
Potassium: 2.9 mmol/L — ABNORMAL LOW (ref 3.5–5.1)
SODIUM: 133 mmol/L — AB (ref 135–145)

## 2016-03-03 LAB — CBC
HCT: 37.9 % — ABNORMAL LOW (ref 39.0–52.0)
Hemoglobin: 13.2 g/dL (ref 13.0–17.0)
MCH: 29.7 pg (ref 26.0–34.0)
MCHC: 34.8 g/dL (ref 30.0–36.0)
MCV: 85.2 fL (ref 78.0–100.0)
PLATELETS: 87 10*3/uL — AB (ref 150–400)
RBC: 4.45 MIL/uL (ref 4.22–5.81)
RDW: 12.8 % (ref 11.5–15.5)
WBC: 7.3 10*3/uL (ref 4.0–10.5)

## 2016-03-03 MED ORDER — AMOXICILLIN-POT CLAVULANATE 875-125 MG PO TABS: 1.0000 | ORAL_TABLET | Freq: Two times a day (BID) | ORAL | Status: DC

## 2016-03-03 MED ORDER — DEXTROSE 5 % IV SOLN: 500.0000 mg | Freq: Every day | INTRAVENOUS | Status: DC

## 2016-03-03 MED ORDER — AMOXICILLIN-POT CLAVULANATE 875-125 MG PO TABS
1.0000 | ORAL_TABLET | Freq: Two times a day (BID) | ORAL | Status: DC
  Filled 2016-03-03: qty 1

## 2016-03-03 NOTE — NC FL2 (Signed)
[place] MEDICAID FL2 LEVEL OF CARE SCREENING TOOL     IDENTIFICATION  Patient Name: Wesley Pierce Birthdate: 15-Nov-1930 Sex: male Admission Date (Current Location): 02/28/2016  [facility] [place]   Facility and Address:  [facility],  [address], [place] [id number]      Provider Number: [id number]  Attending Physician Name and Address:  Calvert CantorSaima Rizwan, MD  Relative Name and Phone Number:       Current Level of Care: Hospital Recommended Level of Care: Skilled Nursing Facility Prior Approval Number:    Date Approved/Denied:   PASRR Number:    Discharge Plan: SNF    Current Diagnoses: Patient Active Problem List   Diagnosis Date Noted  . Altered mental status 02/29/2016  . CAP (community acquired pneumonia) 02/29/2016  . Right lower lobe pneumonia 02/29/2016  . Dementia due to Parkinson's disease with behavioral disturbance (HCC) 09/13/2015  . Parkinson's disease dementia (HCC) 07/18/2015  . 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 RLS (restless legs syndrome) 03/05/2015  . B12 deficiency 03/05/2015  . Hereditary and idiopathic peripheral neuropathy 03/05/2015  . Parkinson disease (HCC) 03/05/2015  . Parasomnia 03/04/2015  . Parkinsonism (HCC) 08/03/2014  . Post-operative state 09/16/2012  . Acute sinusitis 08/16/2012  . Anal stenosis 08/12/2012  . Acute bronchitis 05/01/2012  . Hemorrhoids 03/08/2012  . Hemorrhoids 02/05/2012  . COPD (chronic obstructive pulmonary disease) (HCC) 05/29/2011  . PERSISTENT DISORDER INITIATING/MAINTAINING SLEEP 08/30/2009  . UNSPEC ALVEOLAR&PARIETOALVEOLAR PNEUMONOPATHY 06/02/2009  . HYPERLIPIDEMIA 06/01/2009  . RESTLESS LEG SYNDROME 06/01/2009  . GERD 06/01/2009  . OSTEOARTHRITIS 06/01/2009    Orientation RESPIRATION BLADDER Height & Weight     Self  Normal Continent Weight: 68.04 kg (150 lb) Height:  5\' 6"  (167.6 cm)  BEHAVIORAL SYMPTOMS/MOOD  NEUROLOGICAL BOWEL NUTRITION STATUS  Other (Comment) (no behaviors)   Continent Diet (DYS 3)  AMBULATORY STATUS COMMUNICATION OF NEEDS Skin   Limited Assist Verbally Normal                       Personal Care Assistance Level of Assistance  Bathing, Feeding, Dressing Bathing Assistance: Limited assistance Feeding assistance: Limited assistance Dressing Assistance: Limited assistance     Functional Limitations Info  Sight, Hearing, Speech Sight Info: Adequate Hearing Info: Adequate Speech Info: Adequate    SPECIAL CARE FACTORS FREQUENCY  PT (By licensed PT), OT (By licensed OT)     PT Frequency: 5 x wk OT Frequency: 5 x wk            Contractures Contractures Info: Not present    Additional Factors Info  Code Status Code Status Info: Full code             Current Medications (03/03/2016):  This is the current hospital active medication list Current Facility-Administered Medications  Medication Dose Route Frequency Provider Last Rate Last Dose  . acetaminophen (TYLENOL) tablet 650 mg  650 mg Oral PRN Hillary BowJared M Gardner, DO   650 mg at 03/01/16 1659  . albuterol (PROVENTIL) (2.5 MG/3ML) 0.083% nebulizer solution 2.5 mg  2.5 mg Nebulization Q6H PRN Hillary BowJared M Gardner, DO   2.5 mg at 03/01/16 0242  . arformoterol (BROVANA) nebulizer solution 15 mcg  15 mcg Nebulization BID Hillary BowJared M Gardner, DO   15 mcg at 03/02/16 2014  . aspirin EC tablet 81 mg  81 mg Oral Daily Hillary BowJared M Gardner, DO   81 mg at 03/03/16 [id number]  .  azithromycin (ZITHROMAX) 500 mg in dextrose 5 % 250 mL IVPB  500 mg Intravenous QHS Hillary Bow, DO   500 mg at 03/02/16 2336  . carbidopa-levodopa (SINEMET IR) 25-100 MG per tablet immediate release 1 tablet  1 tablet Oral TID Hillary Bow, DO   1 tablet at 03/03/16 1610  . enoxaparin (LOVENOX) injection 40 mg  40 mg Subcutaneous Q24H Hillary Bow, DO   40 mg at 03/03/16 9604  . fluticasone (FLONASE) 50 MCG/ACT nasal spray 2 spray  2 spray Each Nare Daily  Hillary Bow, DO   2 spray at 03/03/16 [phone]  . gabapentin (NEURONTIN) capsule 300 mg  300 mg Oral QHS Hillary Bow, DO   300 mg at 03/01/16 2138  . guaiFENesin (MUCINEX) 12 hr tablet 600 mg  600 mg Oral PRN Hillary Bow, DO      . piperacillin-tazobactam (ZOSYN) IVPB 3.375 g  3.375 g Intravenous Q8H Jeralyn Bennett, MD   3.375 g at 03/03/16 0924  . QUEtiapine (SEROQUEL) tablet 75 mg  75 mg Oral QHS Hillary Bow, DO   75 mg at 03/02/16 2338  . RESOURCE THICKENUP CLEAR   Oral PRN Calvert Cantor, MD      . rivastigmine (EXELON) 4.6 mg/24hr 4.6 mg  4.6 mg Transdermal Q0600 Hillary Bow, DO   4.6 mg at 03/03/16 0616  . rOPINIRole (REQUIP) tablet 1 mg  1 mg Oral QHS Calvert Cantor, MD   1 mg at 03/02/16 2339  . Vortioxetine HBr TABS 10 mg  10 mg Oral q morning - 10a Hillary Bow, DO   10 mg at 03/03/16 8119     Discharge Medications: Please see discharge summary for a list of discharge medications.  Relevant Imaging Results:  Relevant Lab Results:   Additional Information SS # [id number]  Derik Fults, Dickey Gave, LCSW

## 2016-03-03 NOTE — Clinical Social Work Placement (Signed)
   CLINICAL SOCIAL WORK PLACEMENT  NOTE  Date:  03/03/2016  Patient Details  Name: Gena FrayCharles E Bobeck MRN: [id number] Date of Birth: [birth year]  Clinical Social Work is seeking post-discharge placement for this patient at the  SNF level of care (*CSW will initial, date and re-position this form in  chart as items are completed):   Yes   Patient/family provided with [facility] Clinical Social Work Department's list of facilities offering this level of care within the geographic area requested by the patient (or if unable, by the patient's family).   Yes   Patient/family informed of their freedom to choose among providers that offer the needed level of care, that participate in Medicare, Medicaid or managed care program needed by the patient, have an available bed and are willing to accept the patient.   Yes  Patient/family informed of [facility]'s ownership interest in [facility] and [facility], as well as of the fact that they are under no obligation to receive care at these facilities.  PASRR submitted to EDS on  03/03/2016  PASRR number received on   03/03/2016    Existing PASRR number confirmed on       FL2 transmitted to all facilities in geographic area requested by pt/family on  03/03/2016  FL2 transmitted to all facilities within larger geographic area on  Yes     Patient informed that his/her managed care company has contracts with or will negotiate with certain facilities, including the following:   Yes       Yes  Patient/family informed of bed offers received.  Patient [facility] bed [facility]   [facility]       Physician recommends and patient chooses bed at      Patient to be transferred to  SNF on  .03/03/2016  Patient to be transferred to facility by   PTAR    Patient family notified on  03/03/2016 of transfer.  Name of family member notified:   [facility]     PHYSICIAN   Calvert CantorSAIMA RIZWAN, MD    Additional Comment:     _______________________________________________ Clearance CootsNicole A Brandilynn Taormina, LCSW 03/03/2016, 3:04 PM

## 2016-03-03 NOTE — Progress Notes (Signed)
Speech Language Pathology Treatment: Dysphagia  Patient Details Name: Wesley Pierce MRN: [id number] DOB: [birth year] Today's Date: 03/03/2016 Time: 7544-9201 SLP Time Calculation (min) (ACUTE ONLY): 21 min  Assessment / Plan / Recommendation Clinical Impression  Pt too lethargic for po during SLP session, however education with pt's son/spouse took place and given pt has dementia - relaying information will aid pt's care plan.  Reviewed MBS findings including overt aspiration of thin when attempting to swallow barium tablet.   Adaptive equipment reviewed with pt/family including potential use of Provale cup with thin liquids to decrease aspiration and provide pt with QOL/maximize hydration.  In addition, provided written copies of MBS study for SNF SLP.  Spoke to wife/son re: possibly having pt conduct chin tuck posture with straws if prevents him from coughing with thins due to audible aspiration.  Further recommend to consider RMST to maximize pt's airway protection and subsequently swallow ability.    Pt's spouse and son confirm understanding of all information provided and using teach back, main points were reinforced.  Pt's son states that Mr Spillers became choked today during meal x1 - after which they properly encouraged rest and ceased meal.    Pt to dc today to SNF likely per family, therefore SLP will sign off, Please order follow up SLP at SNF to help pt maximize swallow ability/safety.  Thanks for this consult.    HPI HPI: 82 year old male admitted 02/28/16 with cough. Pt was noted to choke/possibly aspirate at home prior to admit. PMH significant for Parkinson's disease, dementia, GERD. CT negative for acute findings, CXR revealed extensive calcified pleural thickening of the RLL, unchanged since 2013.      SLP Plan  All goals met     Recommendations  Diet recommendations: Dysphagia 3 (mechanical soft);Nectar-thick liquid;Other(comment) (ice chips; thin if provale cup is  purchased) Medication Administration: Whole meds with puree (crush if not contraindicated) Compensations: Slow rate;Small sips/bites             Oral Care Recommendations: Oral care BID Follow up Recommendations: Skilled Nursing facility Plan: All goals met     [place], [place] [facility] SLP [phone]

## 2016-03-03 NOTE — Evaluation (Signed)
Physical Therapy Evaluation Patient Details Name: Wesley Pierce MRN: [id number] DOB: 1931/04/30 Today's Date: 03/03/2016   History of Present Illness  82 year old male admitted 02/28/16 with cough. Pt was noted to choke/possibly aspirate at home prior to admit. DX ---CAP;    PMH significant for Parkinson's disease, dementia, GERD. CT negative for acute findings, CXR revealed extensive calcified pleural thickening of the RLL,  Clinical Impression  03/03/2016  Pt admitted with above diagnosis. Pt currently with functional limitations due to the deficits listed below (see PT Problem List). Pt will benefit from skilled PT to increase their independence and safety with mobility to allow discharge to the venue listed below.  Pt is a significant fall risk, he  is deconditioned d/t extended  Bedrest; will need SNF  Post acute;     Follow Up Recommendations SNF;Supervision/Assistance - 24 hour    Equipment Recommendations  Rolling walker with 5" wheels    Recommendations for Other Services       Precautions / Restrictions Precautions Precautions: Fall Restrictions Weight Bearing Restrictions: No      Mobility  Bed Mobility Overal bed mobility: Needs Assistance Bed Mobility: Supine to Sit     Supine to sit: HOB elevated;Mod assist;Max assist     General bed mobility comments: multi-modal cues/facilitation; assist with trunk and LEs  Transfers Overall transfer level: Needs assistance Equipment used: Rolling walker (2 wheeled) Transfers: Sit to/from Stand Sit to Stand: Mod assist         General transfer comment: assist to rise, stabilize  Ambulation/Gait Ambulation/Gait assistance: Mod assist Ambulation Distance (Feet): 70 Feet Assistive device: Rolling walker (2 wheeled) Gait Pattern/deviations: Step-through pattern;Trunk flexed;Shuffle;Drifts right/left;Decreased stride length     General Gait Details: multi-modal cues for RW safety, direction, posture; pt with LOB  to L and requires assist to maintain midline Pt was incontinent of urine during gait  Stairs            Wheelchair Mobility    Modified Rankin (Stroke Patients Only)       Balance Overall balance assessment: Needs assistance Sitting-balance support: Feet supported;Bilateral upper extremity supported Sitting balance-Leahy Scale: Fair     Standing balance support: Bilateral upper extremity supported Standing balance-Leahy Scale: Poor                               Pertinent Vitals/Pain Pain Assessment: No/denies pain    Home Living Family/patient expects to be discharged to:: Private residence Living Arrangements: Spouse/significant other;Children Available Help at Discharge: Family Type of Home: House Home Access: Stairs to enter   Secretary/administrator of Steps: 2 Home Layout: Two level Home Equipment: None Additional Comments: ordered hospital bed, BSC    Prior Function Level of Independence: Independent               Hand Dominance        Extremity/Trunk Assessment   Upper Extremity Assessment: Generalized weakness           Lower Extremity Assessment: Generalized weakness         Communication   Communication: No difficulties  Cognition Arousal/Alertness: Lethargic;Suspect due to medications   Overall Cognitive Status: History of cognitive impairments - at baseline                      General Comments      Exercises        Assessment/Plan  PT Assessment Patient needs continued PT services  PT Diagnosis Difficulty walking   PT Problem List Decreased strength;Decreased activity tolerance;Decreased mobility;Decreased balance;Decreased knowledge of use of DME;Decreased safety awareness  PT Treatment Interventions DME instruction;Gait training;Functional mobility training;Therapeutic activities;Therapeutic exercise;Patient/family education;Balance training   PT Goals (Current goals can be found in the  Care Plan section) Acute Rehab PT Goals Patient Stated Goal: pt does not  state PT Goal Formulation: With patient Time For Goal Achievement: 03/17/16 Potential to Achieve Goals: Good    Frequency Min 3X/week   Barriers to discharge        Co-evaluation               End of Session   Activity Tolerance: Patient tolerated treatment well Patient left: in chair;with call bell/phone within reach;with family/visitor present (no chair alarms available) Nurse Communication: Mobility status         Time: [id number] PT Time Calculation (min) (ACUTE ONLY): 21 min   Charges:   PT Evaluation $PT Eval Low Complexity: 1 Procedure     PT G Codes:        Wesley Pierce 03/03/2016, 12:56 PM

## 2016-03-03 NOTE — Progress Notes (Signed)
[id number]/Rhonda Sharlet SalinaDavis, BSN,RN3,CCM [phone]/[id number]/Spoke with family wife and son would like for patient to go to snf for rehab before going home due to deconditioning and status.  Dr. Butler Denmarkizwan made aware.

## 2016-03-03 NOTE — Clinical Social Work Note (Signed)
Clinical Social Work Assessment  Patient Details  Name: Wesley Pierce MRN: [id number] Date of Birth: 04-01-1931  Date of referral:  03/03/16               Reason for consult:  Facility Placement                Permission sought to share information with:  Case Manager Permission granted to share information::     Name::     SNF  Agency::     Relationship::     Contact Information:     Housing/Transportation Living arrangements for the past 2 months:  Single Family Home Source of Information:  Spouse, Adult Children Patient Interpreter Needed:  None Criminal Activity/Legal Involvement Pertinent to Current Situation/Hospitalization:  No - Comment as needed Significant Relationships:  Adult Children, Spouse Lives with:  Adult Children, Spouse Do you feel safe going back to the place where you live?  No Need for family participation in patient care:  Yes (Comment)  Care giving concerns: Patient and family are concerned with patient decline in health and inability to get safely get around independently. Patient spouse and son are concerned and feel a SNF will be more appropriate for patient at this time.   Social Worker assessment / plan:  LCSWA met with family at bedside, informed them of consult. LCSWA educated family about SNF and criteria. Patient family informed LCSWA about SNF preferences.   Plan: DC patient to SNF  Employment status:  Retired Nurse, adult PT Recommendations:  [place] / Referral to community resources:  [place]  Patient/Family's Response to care: Agreeable  Patient/Family's Understanding of and Emotional Response to Diagnosis, Current Treatment, and Prognosis:  "My father is weaker now, before he was admitted he got around fine using a walker." Patient showed concern with patient deconditioning and feels a SNF will allow patient to get the assistance he needs.   Emotional  Assessment Appearance:  Appears stated age Attitude/Demeanor/Rapport:   (Calm) Affect (typically observed):  Calm, Pleasant Orientation:  Oriented to Place, Oriented to Self, Oriented to Situation Alcohol / Substance use:  Not Applicable Psych involvement (Current and /or in the community):  No (Comment)  Discharge Needs  Concerns to be addressed:  No discharge needs identified Readmission within the last 30 days:  No Current discharge risk:  None Barriers to Discharge:  No Barriers Identified   Lia Hopping, LCSW 03/03/2016, 12:46 PM

## 2016-03-03 NOTE — Progress Notes (Signed)
Date:  March 03, 2016 Chart reviewed for concurrent status and case management needs. Will continue to follow the patient for changes and needs:  No needs present at time of discharge. Expected discharge date: [id number] Marcelle SmilingRhonda Falicia Lizotte, BSN, [place]RN3, [place]CCM   [phone]

## 2016-03-03 NOTE — Discharge Summary (Signed)
Physician Discharge Summary  Wesley Pierce ZOX:[id number] DOB: [birth year] DOA: 02/28/2016  PCP: Minda MeoARONSON,RICHARD A, MD  Admit date: 02/28/2016 Discharge date: 03/03/2016  Time spent: 60 minutes  Recommendations for Outpatient Follow-up:  1. Will go to SNF 2. Needs continued speech/swallow therapy 3. Aspiration precautions while eating 4. Needs to be be fed  Discharge Condition: stable    Discharge Diagnoses:  Principal Problem:   CAP (community acquired pneumonia) Active Problems:   Parkinson disease (HCC)   Dementia due to Parkinson's disease with behavioral disturbance (HCC)   Dysphagia   History of present illness:  Wesley Wesley Pierce is a pleasant 82 year old gentleman with a past medical history of Parkinson's disease, dementia, residing in the community who was admitted early this morning presenting with complaints of cough and shortness of breath. Family members thought he may have had an aspiration event at home when he choked on food while eating. Found to have a fever 101.4 in the emergency department. He was started on empiric IV antibiotic therapy with ceftriaxone and azithromycin for presumed community acquire pneumonia.    Hospital Course:   Community acquire pneumonia vs Aspiration PNA Parkinson's disease, presenting with complaints of cough, sputum production, shortness of breath & a fever of 101.4. -He has a risk for aspiration given history of Parkinson's disease correlated with family reports that he had a choking episode at home.  -Chest x-ray showed chronic scarring/ calcification at right lung base, could not rule out infiltrate in this area. - transition Zosyn to Unasyn and then Augmentin for aspiration- complete 5 day course of Zithromax for CAP  Dysphagia - MBS and bedside eval showing delayed pharyngeal phase with aspiration of think liquids - will need nectar thick liquids and SLP follow up   Acute delirium. - confused and intermittently agitated   -Infection and hospitalization likely precipitating delirium. -Benzodiazepine therapy in this setting may worsen agitation, this would not give any more IV Ativan. -Family members are present at bedside which has been helpful   Parkinson's disease.  Parkinson's disease with Parkinson's dementia -Continue Sinemet 25/100 one tablet by mouth 3 times a day.     Procedures:  MBS  Consultations:  none  Discharge Exam: Filed Weights   02/28/16 2030  Weight: 68.04 kg (150 lb)   Filed Vitals:   03/02/16 2056 03/03/16 0456  BP: 156/67 136/66  Pulse: 67 60  Temp: 98.8 F (37.1 C) 98.4 F (36.9 C)  Resp: 18 16    General: AAO x 3, no distress Cardiovascular: RRR, no murmurs  Respiratory: clear to auscultation bilaterally GI: soft, non-tender, non-distended, bowel sound positive  Discharge Instructions You were cared for by a hospitalist during your hospital stay. If you have any questions about your discharge medications or the care you received while you were in the hospital after you are discharged, you can call the unit and asked to speak with the hospitalist on call if the hospitalist that took care of you is not available. Once you are discharged, your primary care physician will handle any further medical issues. Please note that NO REFILLS for any discharge medications will be authorized once you are discharged, as it is imperative that you return to your primary care physician (or establish a relationship with a primary care physician if you do not have one) for your aftercare needs so that they can reassess your need for medications and monitor your lab values.      Discharge Instructions    Discharge instructions  Complete by:  As directed   Wesley Pierce will need assistance with all meals and snacks. Please follow the instructions given by nurse to prevent aspiration.  Mechanical soft diet with nectar thick liquids.     Increase activity slowly    Complete by:  As  directed             Medication List    STOP taking these medications        cefdinir 300 MG capsule  Commonly known as:  OMNICEF      TAKE these medications        acetaminophen 325 MG tablet  Commonly known as:  TYLENOL  Take 650 mg by mouth as needed.     albuterol 108 (90 Base) MCG/ACT inhaler  Commonly known as:  PROAIR HFA  Inhale 2 puffs into the lungs every 6 (six) hours as needed for wheezing or shortness of breath.     amoxicillin-clavulanate 875-125 MG tablet  Commonly known as:  AUGMENTIN  Take 1 tablet by mouth 2 (two) times daily.     aspirin 81 MG tablet  Take 81 mg by mouth daily.     azithromycin 500 mg in dextrose 5 % 250 mL  Inject 500 mg into the vein at bedtime.     carbidopa-levodopa 25-100 MG tablet  Commonly known as:  SINEMET IR  take 1 tablet by mouth three times a day     ergocalciferol 50000 units capsule  Commonly known as:  VITAMIN D2  Take 50,000 Units by mouth once a week.     FLONASE 50 MCG/ACT nasal spray  Generic drug:  fluticasone  2 sprays by Nasal route daily.     gabapentin 600 MG tablet  Commonly known as:  NEURONTIN  Take 300 mg by mouth at bedtime.     guaiFENesin 600 MG 12 hr tablet  Commonly known as:  MUCINEX  Take 600 mg by mouth as needed.     Polyethylene Glycol 3350 Gran  by Does not apply route.     QUEtiapine 25 MG tablet  Commonly known as:  SEROQUEL  Take 3 tablets (75 mg total) by mouth at bedtime.     rivastigmine 4.6 mg/24hr  Commonly known as:  EXELON  Place 1 patch (4.6 mg total) onto the skin daily.     rOPINIRole 1 MG tablet  Commonly known as:  REQUIP  take 1 and 1/2 tablets by mouth at bedtime     salmeterol 50 MCG/DOSE diskus inhaler  Commonly known as:  SEREVENT  Inhale 1 puff into the lungs 2 (two) times daily.     vitamin B-12 1000 MCG tablet  Commonly known as:  CYANOCOBALAMIN  Take 1,000 mcg by mouth daily.     vortioxetine HBr 10 MG Tabs  Commonly known as:  TRINTELLIX   Take 1 tablet (10 mg total) by mouth every morning.       Allergies  Allergen Reactions  . Advair Diskus [Fluticasone-Salmeterol]     anxiety  . Aloe Swelling  . Ambien [Zolpidem Tartrate] Other (See Comments)    Sleep walking  . Halcion [Triazolam]     Sleep Walking  . Horizant [Gabapentin]     Sleep walking and halucinations  . Lyrica [Pregabalin] Other (See Comments)    Sleep walking   . Sulfonamide Derivatives     Pt unable to remember  . Ciprofloxacin Rash   Follow-up Information    Follow up with ARONSON,RICHARD A, MD In 1  week.   Specialty:  Internal Medicine   Contact information:   [address] [place] [address] [phone]        The results of significant diagnostics from this hospitalization (including imaging, microbiology, ancillary and laboratory) are listed below for reference.    Significant Diagnostic Studies: Dg Chest 2 View  02/28/2016  CLINICAL DATA:  Cough and fever EXAM: CHEST  2 VIEW COMPARISON:  06/18/2012 FINDINGS: Extensive calcified pleural thickening in the right lung base unchanged. Underlying right lung base not well evaluated due to extensive calcified pleural thickening. Mild pleural thickening on the left unchanged. Cardiac enlargement without heart failure. IMPRESSION: Extensive chronic calcified pleural thickening in the right lung base and mild pleural scarring on the left. No superimposed acute abnormality. No change from 2013. Electronically Signed   By: Marlan Palau M.D.   On: 02/28/2016 21:08   Ct Head Wo Contrast  02/28/2016  CLINICAL DATA:  Fall. Recent cough and fever. Syncopal episode tonight. EXAM: CT HEAD WITHOUT CONTRAST CT CERVICAL SPINE WITHOUT CONTRAST TECHNIQUE: Multidetector CT imaging of the head and cervical spine was performed following the standard protocol without intravenous contrast. Multiplanar CT image reconstructions of the cervical spine were also generated. COMPARISON:  12/16/2013 FINDINGS: CT HEAD  FINDINGS The brainstem, cerebellum, cerebral peduncles, thalami, basal ganglia, basilar cisterns, and ventricular system appear within normal limits. Periventricular white matter and corona radiata hypodensities favor chronic ischemic microvascular white matter disease. No intracranial hemorrhage, mass lesion, or acute CVA. Chronic ethmoid and frontal sinusitis. CT CERVICAL SPINE FINDINGS Despite efforts by the technologist and patient, motion artifact is present on today's exam and could not be eliminated. This reduces exam sensitivity and specificity. Fused facet joints on the left at C4-5. Loss of intervertebral disc height at C5-6 and C6-7. 2 mm degenerative grade 1 anterolisthesis at C4-5. Posterior osseous ridging and uncinate spurring at C5-6 and C6-7, causing moderate left osseous foraminal stenosis at C5-6, moderate right foraminal stenosis at C6-7, and mild right foraminal stenosis at C5-6. No prevertebral soft tissue swelling. No cervical spine fracture identified. Degenerative right sternoclavicular arthropathy. Biapical pleural parenchymal scarring. IMPRESSION: 1. No acute intracranial findings and no acute cervical spine findings. 2. Cervical spondylosis causing foraminal impingement at C5-6 and C6-7. 3. 2 mm degenerative grade 1 anterolisthesis at C4-5. 4. Degenerative right sternoclavicular arthropathy. 5. Biapical pleural parenchymal scarring. 6. Chronic ethmoid and frontal sinusitis. 7. Periventricular white matter and corona radiata hypodensities favor chronic ischemic microvascular white matter disease. Electronically Signed   By: Gaylyn Rong M.D.   On: 02/28/2016 22:01   Ct Cervical Spine Wo Contrast  02/28/2016  CLINICAL DATA:  Fall. Recent cough and fever. Syncopal episode tonight. EXAM: CT HEAD WITHOUT CONTRAST CT CERVICAL SPINE WITHOUT CONTRAST TECHNIQUE: Multidetector CT imaging of the head and cervical spine was performed following the standard protocol without intravenous  contrast. Multiplanar CT image reconstructions of the cervical spine were also generated. COMPARISON:  12/16/2013 FINDINGS: CT HEAD FINDINGS The brainstem, cerebellum, cerebral peduncles, thalami, basal ganglia, basilar cisterns, and ventricular system appear within normal limits. Periventricular white matter and corona radiata hypodensities favor chronic ischemic microvascular white matter disease. No intracranial hemorrhage, mass lesion, or acute CVA. Chronic ethmoid and frontal sinusitis. CT CERVICAL SPINE FINDINGS Despite efforts by the technologist and patient, motion artifact is present on today's exam and could not be eliminated. This reduces exam sensitivity and specificity. Fused facet joints on the left at C4-5. Loss of intervertebral disc height at C5-6 and C6-7. 2  mm degenerative grade 1 anterolisthesis at C4-5. Posterior osseous ridging and uncinate spurring at C5-6 and C6-7, causing moderate left osseous foraminal stenosis at C5-6, moderate right foraminal stenosis at C6-7, and mild right foraminal stenosis at C5-6. No prevertebral soft tissue swelling. No cervical spine fracture identified. Degenerative right sternoclavicular arthropathy. Biapical pleural parenchymal scarring. IMPRESSION: 1. No acute intracranial findings and no acute cervical spine findings. 2. Cervical spondylosis causing foraminal impingement at C5-6 and C6-7. 3. 2 mm degenerative grade 1 anterolisthesis at C4-5. 4. Degenerative right sternoclavicular arthropathy. 5. Biapical pleural parenchymal scarring. 6. Chronic ethmoid and frontal sinusitis. 7. Periventricular white matter and corona radiata hypodensities favor chronic ischemic microvascular white matter disease. Electronically Signed   By: Gaylyn Rong M.D.   On: 02/28/2016 22:01    Microbiology: Recent Results (from the past 240 hour(s))  Urine culture     Status: None   Collection Time: 02/28/16 10:45 PM  Result Value Ref Range Status   Specimen Description  URINE, CLEAN CATCH  Final   Special Requests NONE  Final   Culture NO GROWTH Performed at [facility]   Final   Report Status 03/01/2016 FINAL  Final  Culture, blood (routine x 2) Call MD if unable to obtain prior to antibiotics being given     Status: None (Preliminary result)   Collection Time: 02/29/16  5:00 AM  Result Value Ref Range Status   Specimen Description BLOOD RIGHT HAND  Final   Special Requests BOTTLES DRAWN AEROBIC ONLY 5CC  Final   Culture   Final    NO GROWTH 2 DAYS Performed at [facility]    Report Status PENDING  Incomplete  Culture, blood (routine x 2) Call MD if unable to obtain prior to antibiotics being given     Status: None (Preliminary result)   Collection Time: 02/29/16  5:02 AM  Result Value Ref Range Status   Specimen Description BLOOD LEFT ARM  Final   Special Requests BOTTLES DRAWN AEROBIC AND ANAEROBIC 8CC  Final   Culture   Final    NO GROWTH 2 DAYS Performed at [facility]    Report Status PENDING  Incomplete  Culture, sputum-assessment     Status: None   Collection Time: 02/29/16  9:10 PM  Result Value Ref Range Status   Specimen Description SPUTUM  Final   Special Requests NONE  Final   Sputum evaluation THIS SPECIMEN IS ACCEPTABLE FOR SPUTUM CULTURE  Final   Report Status 02/29/2016 FINAL  Final  Culture, respiratory (NON-Expectorated)     Status: None (Preliminary result)   Collection Time: 02/29/16  9:10 PM  Result Value Ref Range Status   Specimen Description SPUTUM  Final   Special Requests NONE  Final   Gram Stain   Final    FEW SQUAMOUS EPITHELIAL CELLS PRESENT FEW WBC PRESENT,BOTH PMN AND MONONUCLEAR RARE GRAM POSITIVE COCCI IN PAIRS    Culture   Final    CULTURE REINCUBATED FOR BETTER GROWTH Performed at [facility]    Report Status PENDING  Incomplete     Labs: Basic Metabolic Panel:  Recent Labs Lab 02/28/16 2154 03/01/16 0730 03/03/16 0822  NA 130* 132* 133*  K 3.9 4.1 2.9*   CL 99* 101 99*  CO2 23 22 26   GLUCOSE 130* 86 100*  BUN 19 19 14   CREATININE 0.74 0.81 0.60*  CALCIUM 8.5* 8.1* 7.8*   Liver Function Tests:  Recent Labs Lab 02/28/16 2154  AST 38  ALT 21  ALKPHOS 69  BILITOT 1.1  PROT 6.4*  ALBUMIN 3.9   No results for input(s): LIPASE, AMYLASE in the last 168 hours. No results for input(s): AMMONIA in the last 168 hours. CBC:  Recent Labs Lab 02/28/16 2154 03/01/16 0730 03/03/16 0822  WBC 9.3 6.8 7.3  NEUTROABS 8.3*  --   --   HGB 12.8* 13.5 13.2  HCT 37.1* 38.6* 37.9*  MCV 86.9 86.4 85.2  PLT 135* 103* 87*   Cardiac Enzymes: No results for input(s): CKTOTAL, CKMB, CKMBINDEX, TROPONINI in the last 168 hours. BNP: BNP (last 3 results) No results for input(s): BNP in the last 8760 hours.  ProBNP (last 3 results) No results for input(s): PROBNP in the last 8760 hours.  CBG: No results for input(s): GLUCAP in the last 168 hours.     SignedCalvert Cantor, MD [facility] 03/03/2016, 1:08 PM

## 2016-03-03 NOTE — Progress Notes (Addendum)
[facility] Obtained PASRR Number: [id number] A Notified facility of patient DC  Notified Family (Spouse  & Son ) Sent clinicals FL2, DC Summary, and Therapy Notes Patient Full Code and Transported via PTAR. Nurse informed with report number.  Vivi BarrackNicole Elza Varricchio, Theresia MajorsLCSWA, MSW Clinical Social Worker 5E and Psychiatric Service Line [phone] 03/03/2016  3:17 PM

## 2016-03-03 NOTE — Care Management Important Message (Signed)
Important Message  Patient Details  Name: Wesley Pierce MRN: [id number] Date of Birth: 1931/01/10   Medicare Important Message Given:  Yes    Haskell FlirtJamison, Saivon Prowse 03/03/2016, 10:51 AMImportant Message  Patient Details  Name: Wesley FrayCharles E Pierce MRN: [id number] Date of Birth: 1931/01/10   Medicare Important Message Given:  Yes    Haskell FlirtJamison, Jorryn Hershberger 03/03/2016, 10:51 AM

## 2016-03-05 LAB — CULTURE, BLOOD (ROUTINE X 2)
CULTURE: NO GROWTH
Culture: NO GROWTH

## 2016-03-08 ENCOUNTER — Ambulatory Visit (HOSPITAL_COMMUNITY): Payer: Self-pay | Admitting: Psychiatry

## 2016-05-25 ENCOUNTER — Ambulatory Visit (HOSPITAL_COMMUNITY): Payer: Self-pay | Admitting: Psychiatry

## 2016-07-19 ENCOUNTER — Ambulatory Visit: Payer: Medicare Other | Admitting: Neurology

## 2016-07-31 ENCOUNTER — Ambulatory Visit (INDEPENDENT_AMBULATORY_CARE_PROVIDER_SITE_OTHER): Payer: Medicare Other | Admitting: Neurology

## 2016-07-31 ENCOUNTER — Encounter: Payer: Self-pay | Admitting: Neurology

## 2016-07-31 VITALS — BP 120/68 | HR 59 | Ht 66.0 in | Wt 149.0 lb

## 2016-07-31 DIAGNOSIS — G20 Parkinson's disease: Secondary | ICD-10-CM

## 2016-07-31 DIAGNOSIS — F02818 Dementia in other diseases classified elsewhere, unspecified severity, with other behavioral disturbance: Secondary | ICD-10-CM

## 2016-07-31 DIAGNOSIS — F0281 Dementia in other diseases classified elsewhere with behavioral disturbance: Secondary | ICD-10-CM

## 2016-07-31 MED ORDER — RIVASTIGMINE 9.5 MG/24HR TD PT24: 9.5000 mg | MEDICATED_PATCH | Freq: Every day | TRANSDERMAL | 11 refills | Status: DC

## 2016-07-31 NOTE — Patient Instructions (Addendum)
Remember to drink plenty of fluid, eat healthy meals and do not skip any meals. Try to eat protein with a every meal and eat a healthy snack such as fruit or nuts in between meals. Try to keep a regular sleep-wake schedule and try to exercise daily, particularly in the form of walking, 20-30 minutes a day, if you can.   As far as your medications are concerned, I would like to suggest: Increase Exelon patch to 9.6mg /day (can use up the patches you have then switch)  I would like to see you back in 6 months, sooner if we need to. Please call us with any interim questions, concerns, problems, updates or refill requests.   Our phone number is [phone]. We also have an after hours call service for urgent matters and there is a physician on-call for urgent questions. For any emergencies you know to call 911 or go to the nearest emergency room

## 2016-07-31 NOTE — Progress Notes (Signed)
[facility]    Provider:  Dr Lucia GaskinsAhern Referring Provider: Geoffry ParadiseAronson, Richard, MD Primary Care Physician:  Minda MeoARONSON,RICHARD A, MD  CC: Parkinson's Disease and Dementia  Interval history 07/31/2016: Wesley Pierce is a 82 y.o. male here as a follow up. He has Parkinson's disease dementia with good response to Sinemet and seroquel, dementia, RLS, B12 deficiency. The Seroquel has helped. Fewer nightmares. Feels memory continues to worseni. He talks about not driving again. Seroquel is a "life saver" Dr. Jacky KindleAronson has increased it. He was diagnosed with pneumonia, he has had aspiration and was evaluated by speech and swallow. He has a difficult time getting the words out. He can eat anything he wants, he is using a straw for liquids, not thickened liquids anymore, he is coughing a little. He was a SNF for 5 weeks. He is home now and doing well. Not wandering at night. Sleeping is going well.    Interval history 01/18/2016: Wesley FrayCharles E Pierce is a 82 y.o. male here as a follow up. He has Parkinson's disease dementia with good response to Sinemet and seroquel, dementia, RLS, B12 deficiency. The Seroquel has helped. Fewer nightmares. Wandering at night is not as bad. He feels like he should be driving. Discussed again, family has taken away keys and also agrees he should not be driving. He is slowly regressing at night. He has started wandering more though. Not taking naps. He goes to bed at 10pm and wakes randomly and wanders. He had nausea with the rivastigmine when tried to increase above 1.5mg  bid. Can't increase oral increase. No dysphagia. No falls. Sinemet helps with his tremor. Memory is stable. He tried Nuplazid but symptoms worsened. He is back on requip at night for his RLS, stopping it did not improve his confusional episodes at night and only worsened his RLS symptoms and periodic limb movements of sleep. He has had a sleep study and prolonged EEG as well. I am glad the seroquel  has been helpful, this can be further titrated if needed. Have discussed the side effects and black box warnings in the elderly for risk of increased mortality. Consider Namenda at a later time.   Interval history 09/13/2015: Wesley FrayCharles E Pierce is a 82 y.o. male here as a follow up. He has Parkinson's with good response to Sinemet, dementia, RLS, B12 deficiency. The Seroquel has helped. Fewer nightmares. Wandering at night is not as bad. He is upset his license has been taken away. He wants to drive. It is a hardship on the family. He has wandered outside. He was episodes of confusion. The family took away his keys and the keys to the house. There were some times in September he did not know who his son was. This is better. He has had some falls however. No dysphagia. Wife and son provide most of the information. Overall they are thrilled with his improvement. They also Have started an anti-depression medication which has helped. Sinemet helps with his tremor. Memory is stable. He tried Nuplazid but symptoms worsened. He is back on requip at night for his RLS, stopping it did not improve his confusional episodes at night and only worsened his RLS symptoms and periodic limb movements of sleep. He has had a sleep study and prolonged EEG as well. I am glad the seroquel has been helpful, this can be further titrated if needed. Have discussed the side effects and black box warnings in the elderly for risk of increased mortality. He is on Rivastigmine. Will increase today  and consider Namenda at a later time.    Interval history 10/14/102016: Wesley FrayCharles E Pierce is a 82 y.o. male here as a follow up. He has Parkinson's with good response to Sinemet, dementia, RLS, B12 deficiency.He is having confusional episodes at night. Klonopin and melationin worsened symptoms, Nuplazid did not work. He was diagnosed with very mild sleep study. A 3-day VEEG did not show seizre activity. There was no significant oxygen  desaturation only 8.7 minutes of the whole night were spent with desaturated settings. Periodic limb movements were frequent and arousals from periodic limb movements were 5 per hour. The majority of arousals was from spontaneous arousals. His requip was decreased at night to see if this was causing the confusion however he did not do well and it had to be increased again. He destroyed a lampshade in the den this past week. He has confusional episodes, sleepy during the day.   Had a long talk with patient, his wife and son who was with him. He is confusional episodes are likely due to Parkinson's disease and Parkinson's disease dementia. Dr. Dyanne Ihaelmar started Seroquel at night and I did encourage them to try it. I tried to refer them to Dr. Alphonzo LemmingsGerald Plovski was a geriatric psychiatrist however he did not take their insurance, recommended possibly seeing Dr. Suzie PortelaPayne who is also a geriatric psychiatrist. I do feel that Seroquel is a good choice and this can be titrated as well.  Interval history 05/24/2015 : Wesley FrayCharles E Pierce is a 82 y.o. male here as a follow up. He has Parkinson's with good response to Sinemet, dementia, RLS, B12 deficiency. He continues to sleep walk and recently had a sleep study. He is here to discuss his sleep study. However he does has a follow up with Dr. [name] on the 1st of September. I have explained to him that following up with Dr. [name] for sleep is appropriate but I can give him an initial review. He continues to sleep walk. The other night he urinated on his couch in the middle of the night. Results showed that he was talking to himself and pulling out his lines during wakefullness, there was no REM sleep to evaluate for REM sleep disorder, EEG montage did not show seizures. Sleep study recommendation was to decrease any medications that could cause confusion, he only takes neurontin at night. Can trial stopping that but doubtful this is the cause. May consider an ambulatory eeg.    Interval history 03/04/2015: He continues to significant persistent episodes of sleep walking and confusion at night. Last night they went to bed at 10pm. He woke up last night at 1:30am. He was standing at the sink. He said he was helping people build a pond. He talks back to his wife but he does not remember these episodes. He urinated on the side of the bed. She got him back to bed and he got up again due to RLS, he doesn't remember all of it. He went to the refrigerator and she followed him down. He didn' t know where the fridge was. He remembers getting water but doesn't know why he was so confused. He tosses and tumbles, he doesn't usually remembr the episodes at night. He does around 2 or 4am not knowing which one was the hot or cold spicket. He can't find the door knob at night. He remembers some episodes but others he does not. The wife has gotten him out of the closet many times when he gets lost at night.  He can't get out of the closet.   Thought possibly this was REM sleep disorder but clonazepam made it worse. He sas tried melatonin which also made things worse. The melatonin and clonazepam have made things worse. But these episodes started even before he tried medication. Unclear if this is a medication effect or not. He took [place] and destroyed the lamps one night.   Interval update 11/03/2014:  Patient is sleepwalking. He wakes up and moves. For example, he told his wife he was looking for a lantern. A lot of times he remembers sometimes he doesnt. Usually wife will ask a question and he can give her an answer. Sometimes there is a little agitation, may hit the bed. Agitation is rare, mostly confusional wandering. Last night he was in the kitchen looking for water.   His feet are hurting. The bottoms are tingly. He has to stand up and walk around. He tries to sit down and read which helps some. The bottom of his right foot feels sore. It has been going on for 5 years.   Initial visit  08/03/2014: Wesley Pierce is a 82 y.o. male here as a referral from Dr. Jacky Kindle for Parkinson's disease, RLS and memory changes. He is 82 years old and is a former patient of Dr. Hosie Poisson and is transitioning to me. He was found to have B12 deficiency and started on B12 injections. At last appointment was starte don low-dose Sinemet and titrated to one pill three times daily.  Wife provides most history and updates. Things are gong very well. He is a "whole different person" when he is on the medicine. Can see a significant change and can tell when it wears off. They are taking the medications at 8:30am and is taking it 3x a day a whole pill. Tremor is improving and helps significantly. Still taking the injections. He has some sleeping problems. He takes Ropinerole and gabapentin at night. Usually he sleeps well when he gets to sleep. But he has some difficulty initiating sleep.   Currently taking Neurontin and Requip at bedtime for possible RLS. Is now currently taking Requip 1.5mg  and Gabapentin 300mg  nightly. Tried Gabapentin 600mg  and had hallucinations. Had tried higher dose of Requip but also suffered from hallucinations.   Has had a few TIAs in the past. No strokes. Otherwise healthy.   No family history of parkinson's or other neurodegenerative disorders   Basic lab work, including TSH, reviewed from PCP and was unremarkable.  Review of Systems: Patient complains of symptoms per HPI as well as the following symptoms: no CP, no SOB. Pertinent negatives per HPI. All others negative.   Social History   Social History  . Marital status: Married    Spouse name: [name]  . Number of children: 3  . Years of education: AA degree   Occupational History  . retired from [employer]    Social History Main Topics  . Smoking status: Never Smoker  . Smokeless tobacco: Never Used  . Alcohol use No  . Drug use: No  . Sexual activity: Not on file   Other Topics Concern  . Not on file    Social History Narrative   Patient is married to [name]), has 3 children    Patient is right handed   Education is AA degree   Caffeine consumption is 1 cup daily    Family History  Problem Relation Age of Onset  . Alzheimer's disease Father   . Dementia Father   .  Allergies Brother   . Heart disease Sister   . Stroke Sister   . Clotting disorder Sister     Past Medical History:  Diagnosis Date  . B12 deficiency   . Cognitive decline   . GERD (gastroesophageal reflux disease)   . Hemorrhoids   . Hyperlipidemia   . Interstitial lung disease (HCC)   . Memory loss   . 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 Osteoarthritis   . Parkinson disease (HCC)   . Pleural thickening   . Restless legs syndrome (RLS)     Past Surgical History:  Procedure Laterality Date  . CATARACT EXTRACTION  03/2008  . COLONOSCOPY    . EXAMINATION UNDER ANESTHESIA  08/27/2012   Procedure: EXAM UNDER ANESTHESIA;  Surgeon: Maisie Fus A. Cornett, MD;  Location: [facility];  Service: General;  Laterality: N/A;  . HEMORRHOID SURGERY  06/21/2012   Procedure: HEMORRHOIDECTOMY;  Surgeon: Clovis Pu. Cornett, MD;  Location: [facility];  Service: General;  Laterality: N/A;  . LUNG DECORTICATION  1962    Current Outpatient Prescriptions  Medication Sig Dispense Refill  . acetaminophen (TYLENOL) 325 MG tablet Take 650 mg by mouth as needed.      Marland Kitchen albuterol (PROAIR HFA) 108 (90 Base) MCG/ACT inhaler Inhale 2 puffs into the lungs every 6 (six) hours as needed for wheezing or shortness of breath. 1 Inhaler 3  . aspirin 81 MG tablet Take 81 mg by mouth daily.      . carbidopa-levodopa (SINEMET IR) 25-100 MG tablet take 1 tablet by mouth three times a day 90 tablet 11  . ergocalciferol (VITAMIN D2) 50000 UNITS capsule Take 50,000 Units by mouth once a week.      . fluticasone (FLONASE) 50 MCG/ACT nasal spray 2 sprays by Nasal route daily.      Marland Kitchen  guaiFENesin (MUCINEX) 600 MG 12 hr tablet Take 600 mg by mouth as needed.     . pantoprazole (PROTONIX) 40 MG tablet Take 40 mg by mouth daily.    . Polyethylene Glycol 3350 GRAN by Does not apply route.    Marland Kitchen QUEtiapine (SEROQUEL) 25 MG tablet Take 3 tablets (75 mg total) by mouth at bedtime. 90 tablet 12  . QUEtiapine (SEROQUEL) 50 MG tablet Take 75 mg by mouth at bedtime. 700pm    . rivastigmine (EXELON) 9.5 mg/24hr Place 1 patch (9.5 mg total) onto the skin daily. 30 patch 11  . rOPINIRole (REQUIP) 1 MG tablet take 1 and 1/2 tablets by mouth at bedtime 45 tablet 5  . salmeterol (SEREVENT) 50 MCG/DOSE diskus inhaler Inhale 1 puff into the lungs 2 (two) times daily. 1 Inhaler 5  . vitamin B-12 (CYANOCOBALAMIN) 1000 MCG tablet Take 1,000 mcg by mouth daily.    . Vortioxetine HBr (TRINTELLIX) 10 MG TABS Take 1 tablet (10 mg total) by mouth every morning. 30 tablet 4   No current facility-administered medications for this visit.     Allergies as of 07/31/2016 - Review Complete 07/31/2016  Allergen Reaction Noted  . Advair diskus [fluticasone-salmeterol]  05/16/2012  . Aloe Swelling 07/22/2012  . Ambien [zolpidem tartrate] Other (See Comments) 05/01/2012  . Halcion [triazolam]  08/03/2014  . Horizant [gabapentin]  08/03/2014  . Lyrica [pregabalin] Other (See Comments) 05/01/2012  . Sulfonamide derivatives    . Ciprofloxacin Rash     Vitals: BP 120/68 (BP Location: Right Arm, Patient Position: Sitting, Cuff Size: Normal)   Pulse (!) 59  Ht 5\' 6"  (1.676 m)   Wt 149 lb (67.6 kg)   BMI 24.05 kg/m  Last Weight:  Wt Readings from Last 1 Encounters:  07/31/16 149 lb (67.6 kg)   Last Height:   Ht Readings from Last 1 Encounters:  07/31/16 5\' 6"  (1.676 m)    Montreal Cognitive Assessment  07/31/2016 07/16/2015  Visuospatial/ Executive (0/5) 0 0  Naming (0/3) 3 3  Attention: Read list of digits (0/2) 1 1  Attention: Read list of letters (0/1) 0 1  Attention: Serial 7 subtraction  starting at 100 (0/3) 0 1  Language: Repeat phrase (0/2) 1 1  Language : Fluency (0/1) 0 1  Abstraction (0/2) 0 2  Delayed Recall (0/5) 0 4  Orientation (0/6) 4 4  Total 9 18  Adjusted Score (based on education) 9 18      Coordination: intermittent rest tremor noted in right hand (including with distraction) improved with Sinemet, mild postural tremor L>RUE, mild bradykinesia with finger tapping bilateral, normal foot taps bilat.  Gait:  stands without assistance, upright, very slight shuffling of steps, normal arm swing, negative Romberg, negative pull test  Tone:  Increased on the right with facilitation, normal on the left    Assessment/Plan: Wesley Pierce is a 82 y.o. male here as a follow up. He has Parkinson's with good response to Sinemet, dementia, RLS, B12 deficiency.He was having confusional episodes at night. Klonopin and melationin worsened symptoms, Nuplazid did not work, Seroquel helping quite a bit. He was diagnosed with very mild sleep apnea There was no significant oxygen desaturation only 8.7 minutes of the whole night were spent with desaturated settings. Periodic limb movements were frequent and arousals from periodic limb movements were 5 per hour. The majority of arousals was from spontaneous arousals. His requip was decreased at night to see if this was causing the confusion however he did not do well and it had to be increased again. A 3-day VEEG did not show seizre activity. He is doing well well currently.  Confusion at night likely due to his Parkinson's disease and Parkinson's disease dementia. Dr. Vickey Huger started Seroquel which I think is a good choice, Dr. Mervyn Skeeters has increased with good results. Continue Sinemet at current dose. He was started on SSRI for his depression which is helping with mood.   Doing well on Exelon patch, will increase to 9.5mg  TD dailyt. Add namenda at next appointment, Moca significantly decreased from 18 to 9 today in one  year; Namenda at next appointment.  They had bayada comes in as well as a Child psychotherapist, a physical therapist. They had home safety inspection. They have grab bars.   Discussed HCPOA and POA, they are in process now.   Continue Sinemet.  Cc: Dr. Jeanne Ivan, MD  [facility] [address] [place], [place] [address]  A total of 30 minutes was spent in with this patient and his family face-to-face. Over half this time was spent on counseling patient on the Parkinson's disease dementia diagnosis and different therapeutic options available.

## 2016-09-29 ENCOUNTER — Other Ambulatory Visit: Payer: Self-pay | Admitting: Emergency Medicine

## 2016-11-15 ENCOUNTER — Encounter: Payer: Self-pay | Admitting: Emergency Medicine

## 2016-11-15 ENCOUNTER — Ambulatory Visit (INDEPENDENT_AMBULATORY_CARE_PROVIDER_SITE_OTHER): Payer: Medicare Other | Admitting: Emergency Medicine

## 2016-11-15 DIAGNOSIS — J439 Emphysema, unspecified: Secondary | ICD-10-CM

## 2016-11-15 DIAGNOSIS — R1312 Dysphagia, oropharyngeal phase: Secondary | ICD-10-CM | POA: Diagnosis not present

## 2016-11-15 DIAGNOSIS — G2581 Restless legs syndrome: Secondary | ICD-10-CM

## 2016-11-15 MED ORDER — UMECLIDINIUM-VILANTEROL 62.5-25 MCG/INH IN AEPB: 1.0000 | INHALATION_SPRAY | Freq: Every day | RESPIRATORY_TRACT | 0 refills | Status: DC

## 2016-11-15 NOTE — Assessment & Plan Note (Signed)
Please continue Serevent twice a day Please keep albuterol available to use as needed for shortness of breath, wheeze, cough Follow with [name] in 12 months or sooner if you have any problems

## 2016-11-15 NOTE — Addendum Note (Signed)
Addended by: Maxwell MarionBLANKENSHIP, MARGIE A on: 11/15/2016 03:02 PM   Modules accepted: Orders

## 2016-11-15 NOTE — Assessment & Plan Note (Signed)
Continue requip

## 2016-11-15 NOTE — Addendum Note (Signed)
Addended by: Maxwell MarionBLANKENSHIP, MARGIE A on: 11/15/2016 03:07 PM   Modules accepted: Orders

## 2016-11-15 NOTE — Patient Instructions (Signed)
Please continue Serevent twice a day Please keep albuterol available to use as needed for shortness of breath, wheeze, cough Please continue to practice your swallowing precautions. In particular use a straw with any liquids. Follow with [name] in 12 months or sooner if you have any problems

## 2016-11-15 NOTE — Assessment & Plan Note (Signed)
In the setting of Parkinson's disease. Continue swallowing precautions.  Please continue to practice your swallowing precautions. In particular use a straw with any liquids.

## 2016-11-15 NOTE — Progress Notes (Signed)
   Subjective:    Patient ID: Wesley Pierce, male    DOB: 07-06-1931, 83 y.o.   MRN: [id number]  HPI 83 year old gentleman who follows up for COPD, some chronic right basilar scar and restless leg syndrome. Also with a hx of a remote L decortication, ? Due to empyema. He has been followed by Dr. Shelle Pierce. He also sees neurology for vitamin B-12 deficiency and early parkinson's. He is currently managed on Foradil, only taking it qd right now. He forgets it, also the insurance cost has been problematic. His restless leg syndrome is treated with Requip 1.5 mg daily at bedtime.  He describes stable breathing, good exercise tolerance. He is able to sing in chorus.  His restless legs are active - he just had a repeat PSG last week. Also Sleep walking is a concern, ? Related to degree of his medications, gabapentin, etc. History today is taken from the patient and from his wife.   ROV 11/09/15 -- patient with a history of COPD. He also has restless leg syndrome and nocturnal sleep ambulation has been followed by Dr. Vickey Pierce.  He is much improved since starting seroquel. Also on rivastigmine, sinemet, requip.  Last time we had to replace foradil to serevent discus. Hasn't seemed to lose any ground. He may have a bit more cough. He can walk around the block, but is unable to jog.   ROV 11/15/16 -- Follow-up visit for patient with a history Parkinson's, COPD, restless leg syndrome. Also with a history of a remote left decortication that I believe was for an empyema. He was admitted in May for a R basilar PNA suspicious for aspiration. He has dys[phagia from his Parkinson's. He has been instructed to use a straw for any drink. He believes that his breathing has improved. He has Serevent bid, has albuterol but has not needed. No wheeze, minimal cough. Able ot do some yard work. Feels that his exercise tolerance has built back up since the PNA. Flu shot up to date.     Review of Systems As per HPI       Objective:   Physical Exam Vitals:   11/15/16 1401  BP: 124/60  Pulse: 68  SpO2: 96%  Weight: 151 lb (68.5 kg)  Height: 5\' 6"  (1.676 m)   Gen: Pleasant, well-nourished, in no distress,  normal affect  ENT: No lesions,  mouth clear,  oropharynx clear, no postnasal drip  Neck: No JVD, no TMG, no carotid bruits  Lungs: No use of accessory muscles, clear on the left, inspiratory right basilar crackles, no wheezes  Cardiovascular: RRR, heart sounds normal, no murmur or gallops, no peripheral edema  Musculoskeletal: No deformities, no cyanosis or clubbing  Neuro: alert, non focal, some forgetfulness and difficulty with short-term memory  Skin: Warm, no lesions or rashes      Assessment & Plan:  COPD (chronic obstructive pulmonary disease) Please continue Serevent twice a day Please keep albuterol available to use as needed for shortness of breath, wheeze, cough Follow with Dr Wesley Pierce in 12 months or sooner if you have any problems  RESTLESS LEG SYNDROME Continue requip  Dysphagia In the setting of Parkinson's disease. Continue swallowing precautions.  Please continue to practice your swallowing precautions. In particular use a straw with any liquids.  Wesley Pupaobert Lesette Frary, MD, PhD 11/15/2016, 2:22 PM [facility] [phone] or if no answer [phone]

## 2016-11-20 ENCOUNTER — Encounter: Payer: Self-pay | Admitting: Podiatry

## 2016-11-20 ENCOUNTER — Ambulatory Visit (INDEPENDENT_AMBULATORY_CARE_PROVIDER_SITE_OTHER): Payer: Medicare Other | Admitting: Podiatry

## 2016-11-20 DIAGNOSIS — L600 Ingrowing nail: Secondary | ICD-10-CM

## 2016-11-20 NOTE — Progress Notes (Signed)
   Subjective:    Patient ID: Wesley Pierce, male    DOB: Apr 05, 1931, 83 y.o.   MRN: [id number]  HPI Chief Complaint  Patient presents with  . Nail Problem    Right foot; great toe-medial side; x2 months      Review of Systems  HENT: Positive for sinus pain.   Gastrointestinal: Positive for constipation.  Genitourinary: Positive for difficulty urinating.  All other systems reviewed and are negative.      Objective:   Physical Exam        Assessment & Plan:

## 2016-11-20 NOTE — Progress Notes (Signed)
Subjective:     Patient ID: Gena FrayCharles E Bergren, male   DOB: 12/06/1930, 83 y.o.   MRN: [id number]  HPI patient presents with caregiver stating that he has a lot of pain in his right hallux nail and it's ingrown and he's tried to trim and soak it without relief and been present for several months   Review of Systems  All other systems reviewed and are negative.      Objective:   Physical Exam  Constitutional: He is oriented to person, place, and time.  Cardiovascular: Intact distal pulses.   Musculoskeletal: Normal range of motion.  Neurological: He is oriented to person, place, and time.  Skin: Skin is warm.  Nursing note and vitals reviewed.  neurovascular status found to be intact muscle strength was adequate range of motion within normal limits with patient found to have incurvated right hallux medial border that's painful when pressed with deformity of the nailbed with distal redness but no active drainage noted. Found have good digital perfusion and well oriented 3     Assessment:     Ingrown toenail deformity right hallux medial border with pain    Plan:     H&P conditions reviewed and recommended removal of the nail border. At this point I explained permanent procedure and risk and I infiltrated the right hallux 60 Milligan times I can Marcaine mixture removed the medial border exposed matrix and applied phenol 3 applications 30 seconds followed by alcohol lavaged sterile dressing instructed on soaks and reappoint Y keep repeating someday

## 2016-11-20 NOTE — Patient Instructions (Signed)

## 2016-12-06 ENCOUNTER — Other Ambulatory Visit: Payer: Self-pay | Admitting: Emergency Medicine

## 2016-12-10 ENCOUNTER — Other Ambulatory Visit: Payer: Self-pay | Admitting: Neurology

## 2017-01-09 ENCOUNTER — Other Ambulatory Visit: Payer: Self-pay | Admitting: Neurology

## 2017-01-17 ENCOUNTER — Telehealth: Payer: Self-pay | Admitting: Neurology

## 2017-01-17 NOTE — Telephone Encounter (Signed)
Pt's wife called back to advised he hasn't been himself since Monday. He is confused as to not knowing where things are, this can come and go. She doesn't know if this is related.

## 2017-01-17 NOTE — Telephone Encounter (Signed)
Returned call to pt's wife. Reports that they have been rotating patch sites and pt has an itchy red rash everywhere that a patch has been placed this week. Pt has been using same dose of Exelon patches since Oct w/o difficulty until now. Wife says that they did open a new package of patches this week. Discussed the possibility of a change in or sensitivity to the adhesive. Called pt's pharmacy and they report that pt's patches have been ordered from the same company each time, no changes in manufacturer as far as they can tell.

## 2017-01-17 NOTE — Telephone Encounter (Signed)
Patient's wife is calling. He has been using rivastigmine (EXELON) 9.5 mg/24hr patch since October. Recently he has noticed itching and rash around the patch.

## 2017-01-17 NOTE — Telephone Encounter (Signed)
I would stop the patch for a few weeks and then try again. If he still has sensitivity we will not be able to use them unless the symptoms are mild and he can tolerate the symptoms. I don;t think the confusion is related. If he has an acute worsening of confusion he should be seen at his primary care office to ensure he doesn;t have any metabolic/infectious cause for his confusion like a urinary tract infection. thanks

## 2017-01-17 NOTE — Telephone Encounter (Signed)
Tried to call pt's wife back but no answer and unable to leave VM mssg. Will attempt again tomorrow.

## 2017-01-18 NOTE — Telephone Encounter (Signed)
Pt's wife called back this morning. Reports that they were doing ok but didn't get much sleep last night despite giving pt PO antihistamines. Says that he has used 7 patches out of the new package and he has 7 red spots. Advised that pt stop the Exelon patches for now, until f/u appt w/ Megan NP at the end of the month. Suggested that she could also try topical hydrocortisone cream for comfort. Verbalized understanding and appreciation for call. Will call back if any additional questions/concerns arise.

## 2017-01-29 ENCOUNTER — Ambulatory Visit (INDEPENDENT_AMBULATORY_CARE_PROVIDER_SITE_OTHER): Payer: Medicare Other | Admitting: Adult Health

## 2017-01-29 ENCOUNTER — Encounter: Payer: Self-pay | Admitting: Adult Health

## 2017-01-29 VITALS — BP 110/62 | HR 68 | Ht 66.0 in | Wt 152.8 lb

## 2017-01-29 DIAGNOSIS — F0281 Dementia in other diseases classified elsewhere with behavioral disturbance: Secondary | ICD-10-CM | POA: Diagnosis not present

## 2017-01-29 DIAGNOSIS — G20 Parkinson's disease: Secondary | ICD-10-CM

## 2017-01-29 MED ORDER — MEMANTINE HCL 5 MG PO TABS: 5.0000 mg | ORAL_TABLET | Freq: Two times a day (BID) | ORAL | 11 refills | Status: DC

## 2017-01-29 NOTE — Progress Notes (Signed)
PATIENT: Wesley Pierce DOB: Jan 31, 1931  REASON FOR VISIT: follow up- memory HISTORY FROM: patient, son and wife  HISTORY OF PRESENT ILLNESS: Today 01/29/2017 Wesley Pierce is an 83 year old male with a history of Parkinson's disease dementia. He returns today for follow-up. He is currently taking Sinemet 25-100 milligrams 3 times a day. He is also on the Exelon patch 9.5 mg. His family states that he has been tolerating Exelon well until his last prescription. He now has developed red spots from the patch. Family states that he complains that the red spots do itch. Patient is currently living at home with his wife son and daughter-in-law. He is able to complete all ADLs independently but does require some prompting. He does not operate a motor vehicle. Family reports that he does have some rough nights. However Seroquel was recently increased. Family does report that if he misses a dose of Seroquel it is noticeable. Otherwise his behavior and mood is controlled for the most part. He does have a right hand tremor. Denies any trouble with his walking or balance. Denies any trouble swallowing. He returns today for an evaluation.   Interval history 07/31/2016: Wesley Pierce is a 83 y.o. male here as a follow up. He has Parkinson's disease dementia with good response to Sinemet and seroquel, dementia, RLS, B12 deficiency. The Seroquel has helped. Fewer nightmares. Feels memory continues to worseni. He talks about not driving again. Seroquel is a "life saver" Dr. Jacky Kindle has increased it. He was diagnosed with pneumonia, he has had aspiration and was evaluated by speech and swallow. He has a difficult time getting the words out. He can eat anything he wants, he is using a straw for liquids, not thickened liquids anymore, he is coughing a little. He was a SNF for 5 weeks. He is home now and doing well. Not wandering at night. Sleeping is going well.    Interval history 01/18/2016: Wesley Pierce is a 83 y.o. male here as a follow up. He has Parkinson's disease dementia with good response to Sinemet and seroquel, dementia, RLS, B12 deficiency. The Seroquel has helped. Fewer nightmares. Wandering at night is not as bad. He feels like he should be driving. Discussed again, family has taken away keys and also agrees he should not be driving. He is slowly regressing at night. He has started wandering more though. Not taking naps. He goes to bed at 10pm and wakes randomly and wanders. He had nausea with the rivastigmine when tried to increase above 1.5mg  bid. Can't increase oral increase. No dysphagia. No falls. Sinemet helps with his tremor. Memory is stable. He tried Nuplazid but symptoms worsened. He is back on requip at night for his RLS, stopping it did not improve his confusional episodes at night and only worsened his RLS symptoms and periodic limb movements of sleep. He has had a sleep study and prolonged EEG as well. I am glad the seroquel has been helpful, this can be further titrated if needed. Have discussed the side effects and black box warnings in the elderly for risk of increased mortality. Consider Namenda at a later time.   Interval history 09/13/2015: Wesley Pierce is a 83 y.o. male here as a follow up. He has Parkinson's with good response to Sinemet, dementia, RLS, B12 deficiency. The Seroquel has helped. Fewer nightmares. Wandering at night is not as bad. He is upset his license has been taken away. He wants to drive. It is a hardship on  the family. He has wandered outside. He was episodes of confusion. The family took away his keys and the keys to the house. There were some times in September he did not know who his son was. This is better. He has had some falls however. No dysphagia. Wife and son provide most of the information. Overall they are thrilled with his improvement. They also Have started an anti-depression medication which has helped. Sinemet helps with his  tremor. Memory is stable. He tried Nuplazid but symptoms worsened. He is back on requip at night for his RLS, stopping it did not improve his confusional episodes at night and only worsened his RLS symptoms and periodic limb movements of sleep. He has had a sleep study and prolonged EEG as well. I am glad the seroquel has been helpful, this can be further titrated if needed. Have discussed the side effects and black box warnings in the elderly for risk of increased mortality. He is on Rivastigmine. Will increase today and consider Namenda at a later time.    Interval history 10/14/102016: Wesley Pierce is a 83 y.o. male here as a follow up. He has Parkinson's with good response to Sinemet, dementia, RLS, B12 deficiency.He is having confusional episodes at night. Klonopin and melationin worsened symptoms, Nuplazid did not work. He was diagnosed with very mild sleep study. A 3-day VEEG did not show seizre activity. There was no significant oxygen desaturation only 8.7 minutes of the whole night were spent with desaturated settings. Periodic limb movements were frequent and arousals from periodic limb movements were 5 per hour. The majority of arousals was from spontaneous arousals. His requip was decreased at night to see if this was causing the confusion however he did not do well and it had to be increased again. He destroyed a lampshade in the den this past week. He has confusional episodes, sleepy during the day.   Had a long talk with patient, his wife and son who was with him. He is confusional episodes are likely due to Parkinson's disease and Parkinson's disease dementia. Dr. Dyanne Iha started Seroquel at night and I did encourage them to try it. I tried to refer them to Dr. Alphonzo Lemmings was a geriatric psychiatrist however he did not take their insurance, recommended possibly seeing Dr. Suzie Portela who is also a geriatric psychiatrist. I do feel that Seroquel is a good choice and this can be titrated  as well.  Interval history 05/24/2015 : Wesley Pierce is a 83 y.o. male here as a follow up. He has Parkinson's with good response to Sinemet, dementia, RLS, B12 deficiency. He continues to sleep walk and recently had a sleep study. He is here to discuss his sleep study. However he does has a follow up with Dr. Vickey Huger on the 1st of September. I have explained to him that following up with Dr. Vickey Huger for sleep is appropriate but I can give him an initial review. He continues to sleep walk. The other night he urinated on his couch in the middle of the night. Results showed that he was talking to himself and pulling out his lines during wakefullness, there was no REM sleep to evaluate for REM sleep disorder, EEG montage did not show seizures. Sleep study recommendation was to decrease any medications that could cause confusion, he only takes neurontin at night. Can trial stopping that but doubtful this is the cause. May consider an ambulatory eeg.   Interval history 03/04/2015:He continues to significant persistent episodes of sleep  walking and confusion at night. Last night they went to bed at 10pm. He woke up last night at 1:30am. He was standing at the sink. He said he was helping people build a pond. He talks back to his wife but he does not remember these episodes. He urinated on the side of the bed. She got him back to bed and he got up again due to RLS, he doesn't remember all of it. He went to the refrigerator and she followed him down. He didn' t know where the fridge was. He remembers getting water but doesn't know why he was so confused. He tosses and tumbles, he doesn't usually remembr the episodes at night. He does around 2 or 4am not knowing which one was the hot or cold spicket. He can't find the door knob at night. He remembers some episodes but others he does not. The wife has gotten him out of the closet many times when he gets lost at night. He can't get out of the closet.   Thought  possibly this was REM sleep disorder but clonazepam made it worse. He sas tried melatonin which also made things worse. The melatonin and clonazepam have made things worse. But these episodes started even before he tried medication. Unclear if this is a medication effect or not. He took [place] and destroyed the lamps one night.   Interval update 11/03/2014:  Patient is sleepwalking. He wakes up and moves. For example, he told his wife he was looking for a lantern. A lot of times he remembers sometimes he doesnt. Usually wife will ask a question and he can give her an answer. Sometimes there is a little agitation, may hit the bed. Agitation is rare, mostly confusional wandering. Last night he was in the kitchen looking for water.   His feet are hurting. The bottoms are tingly. He has to stand up and walk around. He tries to sit down and read which helps some. The bottom of his right foot feels sore. It has been going on for 5 years.   Initial visit 08/03/2014: Wesley Pierce is a 83 y.o. male here as a referral from Dr. Jacky Kindle for Parkinson's disease, RLS and memory changes. He is 83 years old and is a former patient of Dr. Hosie Poisson and is transitioning to me. He was found to have B12 deficiency and started on B12 injections. At last appointment was starte don low-dose Sinemet and titrated to one pill three times daily.  Wife provides most history and updates. Things are gong very well. He is a "whole different person" when he is on the medicine. Can see a significant change and can tell when it wears off. They are taking the medications at 8:30am and is taking it 3x a day a whole pill. Tremor is improving and helps significantly. Still taking the injections. He has some sleeping problems. He takes Ropinerole and gabapentin at night. Usually he sleeps well when he gets to sleep. But he has some difficulty initiating sleep.   Currently taking Neurontin and Requip at bedtime for possible RLS. Is now  currently taking Requip 1.5mg  and Gabapentin  nightly. Tried Gabapentin  and had hallucinations. Had tried higher dose of Requip but also suffered from hallucinations.   Has had a few TIAs in the past. No strokes. Otherwise healthy.   No family history of parkinson's or other neurodegenerative disorders   Basic lab work, including TSH, reviewed from PCP and was unremarkable.    REVIEW OF SYSTEMS:  Out of a complete 14 system review of symptoms, the patient complains only of the following symptoms, and all other reviewed systems are negative.  Numbness, speech difficulty, eye itching, cough  ALLERGIES: Allergies  Allergen Reactions  . Advair Diskus [Fluticasone-Salmeterol]     anxiety  . Aloe Swelling  . Ambien [Zolpidem Tartrate] Other (See Comments)    Sleep walking  . Halcion [Triazolam]     Sleep Walking  . Horizant [Gabapentin]     Sleep walking and halucinations  . Lyrica [Pregabalin] Other (See Comments)    Sleep walking   . Sulfonamide Derivatives     Pt unable to remember  . Ciprofloxacin Rash    HOME MEDICATIONS: Outpatient Medications Prior to Visit  Medication Sig Dispense Refill  . acetaminophen (TYLENOL) 325 MG tablet Take 650 mg by mouth as needed.      Marland Kitchen albuterol (PROAIR HFA) 108 (90 Base) MCG/ACT inhaler Inhale 2 puffs into the lungs every 6 (six) hours as needed for wheezing or shortness of breath. 1 Inhaler 3  . aspirin 81 MG tablet Take 81 mg by mouth daily.      . carbidopa-levodopa (SINEMET IR) 25-100 MG tablet take 1 tablet by mouth three times a day 90 tablet 11  . ergocalciferol (VITAMIN D2) 50000 UNITS capsule Take 50,000 Units by mouth once a week.      . fluticasone (FLONASE) 50 MCG/ACT nasal spray 2 sprays by Nasal route daily.      Marland Kitchen guaiFENesin (MUCINEX) 600 MG 12 hr tablet Take 600 mg by mouth as needed.     . pantoprazole (PROTONIX) 40 MG tablet Take 40 mg by mouth daily.    . Polyethylene Glycol 3350 GRAN by Does not apply  route.    Marland Kitchen QUEtiapine (SEROQUEL) 25 MG tablet Take 3 tablets (75 mg total) by mouth at bedtime. (Patient taking differently: Take 25 mg by mouth daily. ) 90 tablet 12  . QUEtiapine (SEROQUEL) 50 MG tablet Take 150 mg by mouth at bedtime. 700pm    . rivastigmine (EXELON) 9.5 mg/24hr Place 1 patch (9.5 mg total) onto the skin daily. 30 patch 11  . rOPINIRole (REQUIP) 1 MG tablet take 1 and 1/2 tablets by mouth at bedtime 45 tablet 5  . SEREVENT DISKUS 50 MCG/DOSE diskus inhaler inhale 1 puff INTO THE LUNGS TWO TIMES A  DAY 1 Inhaler 5  . vitamin B-12 (CYANOCOBALAMIN) 1000 MCG tablet Take 1,000 mcg by mouth daily.    . Vortioxetine HBr (TRINTELLIX) 10 MG TABS Take 1 tablet (10 mg total) by mouth every morning. 30 tablet 4   No facility-administered medications prior to visit.     PAST MEDICAL HISTORY: Past Medical History:  Diagnosis Date  . B12 deficiency   . Cognitive decline   . GERD (gastroesophageal reflux disease)   . Hemorrhoids   . Hyperlipidemia   . Interstitial lung disease (HCC)   . Memory loss   . 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 Osteoarthritis   . Parkinson disease (HCC)   . Pleural thickening   . Restless legs syndrome (RLS)     PAST SURGICAL HISTORY: Past Surgical History:  Procedure Laterality Date  . CATARACT EXTRACTION  03/2008  . COLONOSCOPY    . EXAMINATION UNDER ANESTHESIA  08/27/2012   Procedure: EXAM UNDER ANESTHESIA;  Surgeon: Maisie Fus A. Cornett, MD;  Location: [facility];  Service: General;  Laterality: N/A;  . HEMORRHOID SURGERY  06/21/2012  Procedure: HEMORRHOIDECTOMY;  Surgeon: Clovis Pu. Cornett, MD;  Location: [facility];  Service: General;  Laterality: N/A;  . LUNG DECORTICATION  1962    FAMILY HISTORY: Family History  Problem Relation Age of Onset  . Alzheimer's disease Father   . Dementia Father   . Allergies Brother   . Heart disease Sister   . Stroke Sister   .  Clotting disorder Sister     SOCIAL HISTORY: Social History   Social History  . Marital status: Married    Spouse name: [name]  . Number of children: 3  . Years of education: AA degree   Occupational History  . retired from [employer]    Social History Main Topics  . Smoking status: Never Smoker  . Smokeless tobacco: Never Used  . Alcohol use No  . Drug use: No  . Sexual activity: Not on file   Other Topics Concern  . Not on file   Social History Narrative   Patient is married to [name]), has 3 children    Patient is right handed   Education is AA degree   Caffeine consumption is 1 cup daily      PHYSICAL EXAM  Vitals:   01/29/17 1404  BP: 110/62  Pulse: 68  Weight: 152 lb 12.8 oz (69.3 kg)  Height: 5\' 6"  (1.676 m)   Body mass index is 24.66 kg/m.  Generalized: Well developed, in no acute distress   Neurological examination  Mentation: Alert oriented to time, place, history taking. Follows all commands speech and language fluent Cranial nerve II-XII: Pupils were equal round reactive to light. Extraocular movements were full, visual field were full on confrontational test. Facial sensation and strength were normal. Uvula tongue midline. Head turning and shoulder shrug  were normal and symmetric. Motor: The motor testing reveals 5 over 5 strength of all 4 extremities. Good symmetric motor tone is noted throughout.  Sensory: Sensory testing is intact to soft touch on all 4 extremities. No evidence of extinction is noted.  Coordination: Cerebellar testing reveals good finger-nose-finger and heel-to-shin bilaterally.  Gait and station: Gait is normal. Tandem gait is normal. Romberg is negative. No drift is seen.     DIAGNOSTIC DATA (LABS, IMAGING, TESTING) - I reviewed patient records, labs, notes, testing and imaging myself where available.  Lab Results  Component Value Date   WBC 7.3 03/03/2016   HGB 13.2 03/03/2016   HCT 37.9 (L) 03/03/2016   MCV 85.2 03/03/2016     PLT 87 (L) 03/03/2016      Component Value Date/Time   NA 133 (L) 03/03/2016 0822   NA 137 07/14/2015 1250   K 2.9 (L) 03/03/2016 0822   CL 99 (L) 03/03/2016 0822   CO2 26 03/03/2016 0822   GLUCOSE 100 (H) 03/03/2016 0822   BUN 14 03/03/2016 0822   BUN 11 07/14/2015 1250   CREATININE 0.60 (L) 03/03/2016 0822   CALCIUM 7.8 (L) 03/03/2016 0822   PROT 6.4 (L) 02/28/2016 2154   PROT 6.4 07/14/2015 1250   ALBUMIN 3.9 02/28/2016 2154   ALBUMIN 4.1 07/14/2015 1250   AST 38 02/28/2016 2154   ALT 21 02/28/2016 2154   ALKPHOS 69 02/28/2016 2154   BILITOT 1.1 02/28/2016 2154   BILITOT 0.4 07/14/2015 1250   GFRNONAA >60 03/03/2016 0822   GFRAA >60 03/03/2016 0822    Lab Results  Component Value Date   HGBA1C 5.8 (H) 11/03/2014   Lab Results  Component Value Date   VITAMINB12  1,116 (H) 11/03/2014   Lab Results  Component Value Date   TSH 1.080 11/03/2014      ASSESSMENT AND PLAN 83 y.o. year old male  has a past medical history of B12 deficiency; Cognitive decline; GERD (gastroesophageal reflux disease); Hemorrhoids; Hyperlipidemia; Interstitial lung disease (HCC); Memory loss; Non-rapid eye movement sleep arousal disorder, sleep walking type (04/12/2015); Osteoarthritis; Parkinson disease (HCC); Pleural thickening; and Restless legs syndrome (RLS). here with:  1. Parkinson's disease with dementia  The patient's memory score was 9 out of 30 at the last visit. The family would like to stop the Exelon patch due to rash. I am amenable to this. They are interested and Namenda. We will begin with 5 mg twice a day. I have reviewed side effects with the patient and his family. They voice understanding. I have advised that if his symptoms worsen or he develops new symptoms they should let us know. He will follow-up in 6 months or sooner if needed.  I spent 15 minutes with the patient 50% of this time was spent discussing the patient's medication.    Butch Penny, MSN, NP-C  01/29/2017, 2:15 PM [facility] [address], [address] [place], [place] [address] [phone]

## 2017-01-29 NOTE — Patient Instructions (Addendum)
Continue Sinemet  Stop Exelon  Start Namenda 5 mg twice a day- If tolerating in 1 month please call for increase. If your symptoms worsen or you develop new symptoms please let us know.   Memantine Tablets What is this medicine? MEMANTINE (MEM an teen) is used to treat dementia caused by Alzheimer's disease. This medicine may be used for other purposes; ask your health care provider or pharmacist if you have questions. COMMON BRAND NAME(S): Namenda What should I tell my health care provider before I take this medicine? They need to know if you have any of these conditions: -difficulty passing urine -kidney disease -liver disease -seizures -an unusual or allergic reaction to memantine, other medicines, foods, dyes, or preservatives -pregnant or trying to get pregnant -breast-feeding How should I use this medicine? Take this medicine by mouth with a glass of water. Follow the directions on the prescription label. You may take this medicine with or without food. Take your doses at regular intervals. Do not take your medicine more often than directed. Continue to take your medicine even if you feel better. Do not stop taking except on the advice of your doctor or health care professional. Talk to your pediatrician regarding the use of this medicine in children. Special care may be needed. Overdosage: If you think you have taken too much of this medicine contact a poison control center or emergency room at once. NOTE: This medicine is only for you. Do not share this medicine with others. What if I miss a dose? If you miss a dose, take it as soon as you can. If it is almost time for your next dose, take only that dose. Do not take double or extra doses. If you do not take your medicine for several days, contact your health care provider. Your dose may need to be changed. What may interact with this  medicine? -acetazolamide -amantadine -cimetidine -dextromethorphan -dofetilide -hydrochlorothiazide -ketamine -metformin -methazolamide -quinidine -ranitidine -sodium bicarbonate -triamterene This list may not describe all possible interactions. Give your health care provider a list of all the medicines, herbs, non-prescription drugs, or dietary supplements you use. Also tell them if you smoke, drink alcohol, or use illegal drugs. Some items may interact with your medicine. What should I watch for while using this medicine? Visit your doctor or health care professional for regular checks on your progress. Check with your doctor or health care professional if there is no improvement in your symptoms or if they get worse. You may get drowsy or dizzy. Do not drive, use machinery, or do anything that needs mental alertness until you know how this drug affects you. Do not stand or sit up quickly, especially if you are an older patient. This reduces the risk of dizzy or fainting spells. Alcohol can make you more drowsy and dizzy. Avoid alcoholic drinks. What side effects may I notice from receiving this medicine? Side effects that you should report to your doctor or health care professional as soon as possible: -allergic reactions like skin rash, itching or hives, swelling of the face, lips, or tongue -agitation or a feeling of restlessness -depressed mood -dizziness -hallucinations -redness, blistering, peeling or loosening of the skin, including inside the mouth -seizures -vomiting Side effects that usually do not require medical attention (report to your doctor or health care professional if they continue or are bothersome): -constipation -diarrhea -headache -nausea -trouble sleeping This list may not describe all possible side effects. Call your doctor for medical advice about side effects. You  may report side effects to FDA at 1-800-FDA-1088. Where should I keep my medicine? Keep  out of the reach of children. Store at room temperature between 15 degrees and 30 degrees C (59 degrees and 86 degrees F). Throw away any unused medicine after the expiration date. NOTE: This sheet is a summary. It may not cover all possible information. If you have questions about this medicine, talk to your doctor, pharmacist, or health care provider.  2018 Elsevier/Gold Standard (2013-07-07 14:10:42)

## 2017-02-08 NOTE — Progress Notes (Signed)
Personally  participated in, made any corrections needed, and agree with history, physical, neuro exam,assessment and plan as stated above.    Antonia Ahern, MD [facility] 

## 2017-02-27 ENCOUNTER — Telehealth: Payer: Self-pay | Admitting: Neurology

## 2017-02-27 MED ORDER — MEMANTINE HCL 10 MG PO TABS: 10.0000 mg | ORAL_TABLET | Freq: Two times a day (BID) | ORAL | 11 refills | Status: DC

## 2017-02-27 MED ORDER — MEMANTINE HCL 5 MG PO TABS: ORAL_TABLET | ORAL | 0 refills | Status: DC

## 2017-02-27 NOTE — Addendum Note (Signed)
Addended by: Donnelly AngelicaHOGAN, [place] Filice L on: 02/27/2017 04:45 PM   Modules accepted: Orders

## 2017-02-27 NOTE — Telephone Encounter (Signed)
Patient's wife called the answer service on 5/28 @ 12:28 requesting refill of Namenda.  [phone]

## 2017-02-27 NOTE — Telephone Encounter (Signed)
Increase to 5 mg in the morning and 10 mg in the evening for 2 weeks. If tolerating can increase to 10 mg BID

## 2017-02-27 NOTE — Telephone Encounter (Signed)
Called back and spoke to patient's wife who reported that pt has been doing extremely well on Namenda 5 mg BID. Would like to increase dose as discussed w/ Megan NP at last OV. [facility] [address]

## 2017-02-27 NOTE — Telephone Encounter (Signed)
Discussed w/ pt's wife. New rx w/ dose increase e-scribed to pt's pharmacy.

## 2017-03-08 ENCOUNTER — Encounter: Payer: Self-pay | Admitting: Podiatry

## 2017-03-08 ENCOUNTER — Ambulatory Visit (INDEPENDENT_AMBULATORY_CARE_PROVIDER_SITE_OTHER): Payer: Medicare Other | Admitting: Podiatry

## 2017-03-08 DIAGNOSIS — L600 Ingrowing nail: Secondary | ICD-10-CM

## 2017-03-08 NOTE — Progress Notes (Signed)
Subjective:    Patient ID: Wesley Pierce, male   DOB: 83 y.o.   MRN: [id number]   HPI patient states it ingrown toenail fixed 4 months ago that healed fine but he is having pain in a different portion of the nailbed    ROS      Objective:  Physical Exam neurovascular status intact no other health history changes with incurvation of the right hallux in a slightly more lateral direction of the medial band that's painful when palpated     Assessment:    Ingrown toenail deformity or more medial lateral direction from previous     Plan:    Reviewed condition and recommended removal of more the nailbed. I explained if this does not fixed we will need to remove the entire nail and he understands this and today I infiltrated the right hallux 60 Milligan times like Marcaine mixture removed more the medial border exposed matrix and applied phenol 3 applications 30 seconds followed by alcohol lavage and sterile dressing

## 2017-03-08 NOTE — Patient Instructions (Addendum)
Soak Instructions    THE DAY AFTER THE PROCEDURE  Place 1/4 cup of epsom salts in a quart of warm tap water.  Submerge your foot or feet with outer bandage intact for the initial soak; this will allow the bandage to become moist and wet for easy lift off.  Once you remove your bandage, continue to soak in the solution for 20 minutes.  This soak should be done twice a day.  Next, remove your foot or feet from solution, blot dry the affected area and cover.  You may use a band aid large enough to cover the area or use gauze and tape.  Apply other medications to the area as directed by the doctor such as polysporin neosporin.  IF YOUR SKIN BECOMES IRRITATED WHILE USING THESE INSTRUCTIONS, IT IS OKAY TO SWITCH TO  WHITE VINEGAR AND WATER. Or you may use antibacterial soap and water to keep the toe clean  Monitor for any signs/symptoms of infection. Call the office immediately if any occur or go directly to the emergency room. Call with any questions/concerns.    Long Term Care Instructions-Post Nail Surgery  You have had your ingrown toenail and root treated with a chemical.  This chemical causes a burn that will drain and ooze like a blister.  This can drain for 6-8 weeks or longer.  It is important to keep this area clean, covered, and follow the soaking instructions dispensed at the time of your surgery.  This area will eventually dry and form a scab.  Once the scab forms you no longer need to soak or apply a dressing.  If at any time you experience an increase in pain, redness, swelling, or drainage, you should contact the office as soon as possible.    Peripheral Neuropathy Peripheral neuropathy is a type of nerve damage. It affects nerves that carry signals between the spinal cord and other parts of the body. These are called peripheral nerves. With peripheral neuropathy, one nerve or a group of nerves may be damaged. What are the causes? Many things can damage peripheral nerves. For some  people with peripheral neuropathy, the cause is unknown. Some causes include:  Diabetes. This is the most common cause of peripheral neuropathy.  Injury to a nerve.  Pressure or stress on a nerve that lasts a long time.  Too little vitamin B. Alcoholism can lead to this.  Infections.  Autoimmune diseases, such as multiple sclerosis and systemic lupus erythematosus.  Inherited nerve diseases.  Some medicines, such as cancer drugs.  Toxic substances, such as lead and mercury.  Too little blood flowing to the legs.  Kidney disease.  Thyroid disease.  What are the signs or symptoms? Different people have different symptoms. The symptoms you have will depend on which of your nerves is damaged. Common symptoms include:  Loss of feeling (numbness) in the feet and hands.  Tingling in the feet and hands.  Pain that burns.  Very sensitive skin.  Weakness.  Not being able to move a part of the body (paralysis).  Muscle twitching.  Clumsiness or poor coordination.  Loss of balance.  Not being able to control your bladder.  Feeling dizzy.  Sexual problems.  How is this diagnosed? Peripheral neuropathy is a symptom, not a disease. Finding the cause of peripheral neuropathy can be hard. To figure that out, your health care provider will take a medical history and do a physical exam. A neurological exam will also be done. This involves checking things  affected by your brain, spinal cord, and nerves (nervous system). For example, your health care provider will check your reflexes, how you move, and what you can feel. Other types of tests may also be ordered, such as:  Blood tests.  A test of the fluid in your spinal cord.  Imaging tests, such as CT scans or an MRI.  Electromyography (EMG). This test checks the nerves that control muscles.  Nerve conduction velocity tests. These tests check how fast messages pass through your nerves.  Nerve biopsy. A small piece of  nerve is removed. It is then checked under a microscope.  How is this treated?  Medicine is often used to treat peripheral neuropathy. Medicines may include: ? Pain-relieving medicines. Prescription or over-the-counter medicine may be suggested. ? Antiseizure medicine. This may be used for pain. ? Antidepressants. These also may help ease pain from neuropathy. ? Lidocaine. This is a numbing medicine. You might wear a patch or be given a shot. ? Mexiletine. This medicine is typically used to help control irregular heart rhythms.  Surgery. Surgery may be needed to relieve pressure on a nerve or to destroy a nerve that is causing pain.  Physical therapy to help movement.  Assistive devices to help movement. Follow these instructions at home:  Only take over-the-counter or prescription medicines as directed by your health care provider. Follow the instructions carefully for any given medicines. Do not take any other medicines without first getting approval from your health care provider.  If you have diabetes, work closely with your health care provider to keep your blood sugar under control.  If you have numbness in your feet: ? Check every day for signs of injury or infection. Watch for redness, warmth, and swelling. ? Wear padded socks and comfortable shoes. These help protect your feet.  Do not do things that put pressure on your damaged nerve.  Do not smoke. Smoking keeps blood from getting to damaged nerves.  Avoid or limit alcohol. Too much alcohol can cause a lack of B vitamins. These vitamins are needed for healthy nerves.  Develop a good support system. Coping with peripheral neuropathy can be stressful. Talk to a mental health specialist or join a support group if you are struggling.  Follow up with your health care provider as directed. Contact a health care provider if:  You have new signs or symptoms of peripheral neuropathy.  You are struggling emotionally from  dealing with peripheral neuropathy.  You have a fever. Get help right away if:  You have an injury or infection that is not healing.  You feel very dizzy or begin vomiting.  You have chest pain.  You have trouble breathing. This information is not intended to replace advice given to you by your health care provider. Make sure you discuss any questions you have with your health care provider. Document Released: 09/08/2002 Document Revised: 02/24/2016 Document Reviewed: 05/26/2013 Elsevier Interactive Patient Education  2017 [employer]

## 2017-06-11 ENCOUNTER — Other Ambulatory Visit: Payer: Self-pay | Admitting: Emergency Medicine

## 2017-07-02 ENCOUNTER — Telehealth: Payer: Self-pay | Admitting: *Deleted

## 2017-07-02 NOTE — Telephone Encounter (Signed)
Pt's wife, Nita Sells states pt had two separate office visits for ingrown toenail removal and now the toe nail is coming off. 07/02/2017-I spoke with [facility] and she states the toenail has come off and the area underneath looks rough. I told her that she should keep the area covered for comfort and bath the area gently daily and often the roughness will slough away and the area will not be tender. Nita Sells states pt has an appt on 07/04/2017 and will discuss with him. I apologized for the late call due to my absence.

## 2017-07-04 ENCOUNTER — Ambulatory Visit (INDEPENDENT_AMBULATORY_CARE_PROVIDER_SITE_OTHER): Payer: Medicare Other | Admitting: Podiatry

## 2017-07-04 ENCOUNTER — Encounter: Payer: Self-pay | Admitting: Podiatry

## 2017-07-04 DIAGNOSIS — L600 Ingrowing nail: Secondary | ICD-10-CM | POA: Diagnosis not present

## 2017-07-04 NOTE — Progress Notes (Signed)
Subjective:    Patient ID: Wesley Pierce, male   DOB: 83 y.o.   MRN: [id number]   HPI patient presents with caregiver stating his nail came off his right big toe and he's concerned    ROS      Objective:  Physical Exam neurovascular status intact with nail bed that has been removed from the right hallux and his fallen off with a plate underneath it that's coming together with no indication of drainage     Assessment:    History of ingrown toenail with moderate change     Plan:    Advised on just watching this of any drainage redness were to occur to let us know and just because her fourth shoe gear usage

## 2017-07-04 NOTE — Patient Instructions (Signed)

## 2017-07-31 ENCOUNTER — Ambulatory Visit (INDEPENDENT_AMBULATORY_CARE_PROVIDER_SITE_OTHER): Payer: Medicare Other | Admitting: Neurology

## 2017-07-31 ENCOUNTER — Encounter: Payer: Self-pay | Admitting: Neurology

## 2017-07-31 VITALS — BP 149/72 | HR 72 | Wt 149.6 lb

## 2017-07-31 DIAGNOSIS — F02818 Dementia in other diseases classified elsewhere, unspecified severity, with other behavioral disturbance: Secondary | ICD-10-CM

## 2017-07-31 DIAGNOSIS — G20 Parkinson's disease: Secondary | ICD-10-CM | POA: Diagnosis not present

## 2017-07-31 DIAGNOSIS — F0281 Dementia in other diseases classified elsewhere with behavioral disturbance: Secondary | ICD-10-CM | POA: Diagnosis not present

## 2017-07-31 DIAGNOSIS — F29 Unspecified psychosis not due to a substance or known physiological condition: Secondary | ICD-10-CM | POA: Diagnosis not present

## 2017-07-31 DIAGNOSIS — G4752 REM sleep behavior disorder: Secondary | ICD-10-CM

## 2017-07-31 DIAGNOSIS — F513 Sleepwalking [somnambulism]: Secondary | ICD-10-CM | POA: Diagnosis not present

## 2017-07-31 DIAGNOSIS — F0280 Dementia in other diseases classified elsewhere without behavioral disturbance: Secondary | ICD-10-CM

## 2017-07-31 MED ORDER — CLONAZEPAM 0.5 MG PO TABS: 0.2500 mg | ORAL_TABLET | Freq: Every day | ORAL | 4 refills | Status: DC

## 2017-07-31 MED ORDER — CARBIDOPA-LEVODOPA 25-100 MG PO TABS: 1.0000 | ORAL_TABLET | Freq: Three times a day (TID) | ORAL | 11 refills | Status: DC

## 2017-07-31 MED ORDER — MEMANTINE HCL 10 MG PO TABS: 10.0000 mg | ORAL_TABLET | Freq: Two times a day (BID) | ORAL | 11 refills | Status: DC

## 2017-07-31 MED ORDER — QUETIAPINE FUMARATE 25 MG PO TABS
ORAL_TABLET | ORAL | 12 refills | Status: DC
Start: 2017-07-31 — End: 2017-08-01

## 2017-07-31 NOTE — Patient Instructions (Signed)
Clonazepam tablets What is this medicine? CLONAZEPAM (kloe NA ze pam) is a benzodiazepine. It is used to treat certain types of seizures. It is also used to treat panic disorder. This medicine may be used for other purposes; ask your health care provider or pharmacist if you have questions. COMMON BRAND NAME(S): Ceberclon, Klonopin What should I tell my health care provider before I take this medicine? They need to know if you have any of these conditions: -an alcohol or drug abuse problem -bipolar disorder, depression, psychosis or other mental health condition -glaucoma -kidney or liver disease -lung or breathing disease -myasthenia gravis -Parkinson's disease -porphyria -seizures or a history of seizures -suicidal thoughts -an unusual or allergic reaction to clonazepam, other benzodiazepines, foods, dyes, or preservatives -pregnant or trying to get pregnant -breast-feeding How should I use this medicine? Take this medicine by mouth with a glass of water. Follow the directions on the prescription label. If it upsets your stomach, take it with food or milk. Take your medicine at regular intervals. Do not take it more often than directed. Do not stop taking or change the dose except on the advice of your doctor or health care professional. A special MedGuide will be given to you by the pharmacist with each prescription and refill. Be sure to read this information carefully each time. Talk to your pediatrician regarding the use of this medicine in children. Special care may be needed. Overdosage: If you think you have taken too much of this medicine contact a poison control center or emergency room at once. NOTE: This medicine is only for you. Do not share this medicine with others. What if I miss a dose? If you miss a dose, take it as soon as you can. If it is almost time for your next dose, take only that dose. Do not take double or extra doses. What may interact with this medicine? Do  not take this medication with any of the following medicines: -narcotic medicines for cough -sodium oxybate This medicine may also interact with the following medications: -alcohol -antihistamines for allergy, cough and cold -antiviral medicines for HIV or AIDS -certain medicines for anxiety or sleep -certain medicines for depression, like amitriptyline, fluoxetine, sertraline -certain medicines for fungal infections like ketoconazole and itraconazole -certain medicines for seizures like carbamazepine, phenobarbital, phenytoin, primidone -general anesthetics like halothane, isoflurane, methoxyflurane, propofol -local anesthetics like lidocaine, pramoxine, tetracaine -medicines that relax muscles for surgery -narcotic medicines for pain -phenothiazines like chlorpromazine, mesoridazine, prochlorperazine, thioridazine This list may not describe all possible interactions. Give your health care provider a list of all the medicines, herbs, non-prescription drugs, or dietary supplements you use. Also tell them if you smoke, drink alcohol, or use illegal drugs. Some items may interact with your medicine. What should I watch for while using this medicine? Tell your doctor or health care professional if your symptoms do not start to get better or if they get worse. Do not stop taking except on your doctor's advice. You may develop a severe reaction. Your doctor will tell you how much medicine to take. You may get drowsy or dizzy. Do not drive, use machinery, or do anything that needs mental alertness until you know how this medicine affects you. To reduce the risk of dizzy and fainting spells, do not stand or sit up quickly, especially if you are an older patient. Alcohol may increase dizziness and drowsiness. Avoid alcoholic drinks. If you are taking another medicine that also causes drowsiness, you may have more side   effects. Give your health care provider a list of all medicines you use. Your doctor  will tell you how much medicine to take. Do not take more medicine than directed. Call emergency for help if you have problems breathing or unusual sleepiness. The use of this medicine may increase the chance of suicidal thoughts or actions. Pay special attention to how you are responding while on this medicine. Any worsening of mood, or thoughts of suicide or dying should be reported to your health care professional right away. What side effects may I notice from receiving this medicine? Side effects that you should report to your doctor or health care professional as soon as possible: -allergic reactions like skin rash, itching or hives, swelling of the face, lips, or tongue -breathing problems -confusion -loss of balance or coordination -signs and symptoms of low blood pressure like dizziness; feeling faint or lightheaded, falls; unusually weak or tired -suicidal thoughts or mood changes Side effects that usually do not require medical attention (report to your doctor or health care professional if they continue or are bothersome): -dizziness -headache -tiredness -upset stomach This list may not describe all possible side effects. Call your doctor for medical advice about side effects. You may report side effects to FDA at 1-800-FDA-1088. Where should I keep my medicine? Keep out of the reach of children. This medicine can be abused. Keep your medicine in a safe place to protect it from theft. Do not share this medicine with anyone. Selling or giving away this medicine is dangerous and against the law. This medicine may cause accidental overdose and death if taken by other adults, children, or pets. Mix any unused medicine with a substance like cat litter or coffee grounds. Then throw the medicine away in a sealed container like a sealed bag or a coffee can with a lid. Do not use the medicine after the expiration date. Store at room temperature between 15 and 30 degrees C (59 and 86 degrees F).  Protect from light. Keep container tightly closed. NOTE: This sheet is a summary. It may not cover all possible information. If you have questions about this medicine, talk to your doctor, pharmacist, or health care provider.  2018 Elsevier/Gold Standard (2016-02-25 18:46:32)  

## 2017-07-31 NOTE — Progress Notes (Signed)
[facility]    Provider:  Dr Lucia Gaskins Referring Provider: [name], MD Primary Care Physician:  [name], MD    CC: Parkinson's Disease and Dementia  Interval history 07/31/2017: Son and wife here. He is having more problems with aphasia, the wrong word coming out. The last few weeks have been more pronounced, he can tell people about a table but can't remember the word "table" or calls it a door. He is eating a normal diet, not thickening fluids, he feels fine now not choking. More progressed over the last few days, unclear if confused. He has been on mucinex for ocngestion in the chest and sinuses. He is drinking enough fluids. Urine is light yellow. Still has difficulty sleeping. He gets into bed but then gets out. He can go to bed in and out 40 times. Will try Clonazepam at bedtime for RLS and REM sleep disorder.    Interval history 07/31/2016: Wesley Pierce is a 83 y.o. male here as a follow up. He has Parkinson's disease dementia with good response to Sinemet and seroquel, dementia, RLS, B12 deficiency. The Seroquel has helped. Fewer nightmares. Feels memory continues to worseni. He talks about not driving again. Seroquel is a "life saver" Dr. Jacky Kindle has increased it. He was diagnosed with pneumonia, he has had aspiration and was evaluated by speech and swallow. He has a difficult time getting the words out. He can eat anything he wants, he is using a straw for liquids, not thickened liquids anymore, he is coughing a little. He was a SNF for 5 weeks. He is home now and doing well. Not wandering at night. Sleeping is going well.    Interval history 01/18/2016: Wesley [facility] is a 83 y.o. male here as a follow up. He has Parkinson's disease dementia with good response to Sinemet and seroquel, dementia, RLS, B12 deficiency. The Seroquel has helped. Fewer nightmares. Wandering at night is not as bad. He feels like he should be driving. Discussed  again, family has taken away keys and also agrees he should not be driving. He is slowly regressing at night. He has started wandering more though. Not taking naps. He goes to bed at 10pm and wakes randomly and wanders. He had nausea with the rivastigmine when tried to increase above 1.5mg  bid. Can't increase oral increase. No dysphagia. No falls. Sinemet helps with his tremor. Memory is stable. He tried Nuplazid but symptoms worsened. He is back on requip at night for his RLS, stopping it did not improve his confusional episodes at night and only worsened his RLS symptoms and periodic limb movements of sleep. He has had a sleep study and prolonged EEG as well. I am glad the seroquel has been helpful, this can be further titrated if needed. Have discussed the side effects and black box warnings in the elderly for risk of increased mortality. Consider Namenda at a later time.   Interval history 09/13/2015: Wesley Pierce is a 83 y.o. male here as a follow up. He has Parkinson's with good response to Sinemet, dementia, RLS, B12 deficiency. The Seroquel has helped. Fewer nightmares. Wandering at night is not as bad. He is upset his license has been taken away. He wants to drive. It is a hardship on the family. He has wandered outside. He was episodes of confusion. The family took away his keys and the keys to the house. There were some times in September he did not know who his son was. This is better.  He has had some falls however. No dysphagia. Wife and son provide most of the information. Overall they are thrilled with his improvement. They also Have started an anti-depression medication which has helped. Sinemet helps with his tremor. Memory is stable. He tried Nuplazid but symptoms worsened. He is back on requip at night for his RLS, stopping it did not improve his confusional episodes at night and only worsened his RLS symptoms and periodic limb movements of sleep. He has had a sleep study and prolonged  EEG as well. I am glad the seroquel has been helpful, this can be further titrated if needed. Have discussed the side effects and black box warnings in the elderly for risk of increased mortality. He is on Rivastigmine. Will increase today and consider Namenda at a later time.    Interval history 10/14/102016: [facility] is a 83 y.o. male here as a follow up. He has Parkinson's with good response to Sinemet, dementia, RLS, B12 deficiency.He is having confusional episodes at night. Klonopin and melationin worsened symptoms, Nuplazid did not work. He was diagnosed with very mild sleep study. A 3-day VEEG did not show seizre activity. There was no significant oxygen desaturation only 8.7 minutes of the whole night were spent with desaturated settings. Periodic limb movements were frequent and arousals from periodic limb movements were 5 per hour. The majority of arousals was from spontaneous arousals. His requip was decreased at night to see if this was causing the confusion however he did not do well and it had to be increased again. He destroyed a lampshade in the den this past week. He has confusional episodes, sleepy during the day.   Had a long talk with patient, his wife and son who was with him. He is confusional episodes are likely due to Parkinson's disease and Parkinson's disease dementia. Dr. Dyanne Iha started Seroquel at night and I did encourage them to try it. I tried to refer them to Dr. Alphonzo Lemmings was a geriatric psychiatrist however he did not take their insurance, recommended possibly seeing Dr. Suzie Portela who is also a geriatric psychiatrist. I do feel that Seroquel is a good choice and this can be titrated as well.  Interval history 05/24/2015 : [facility] is a 83 y.o. male here as a follow up. He has Parkinson's with good response to Sinemet, dementia, RLS, B12 deficiency. He continues to sleep walk and recently had a sleep study. He is here to discuss his sleep study.  However he does has a follow up with Dr. Vickey Huger on the 1st of September. I have explained to him that following up with Dr. Vickey Huger for sleep is appropriate but I can give him an initial review. He continues to sleep walk. The other night he urinated on his couch in the middle of the night. Results showed that he was talking to himself and pulling out his lines during wakefullness, there was no REM sleep to evaluate for REM sleep disorder, EEG montage did not show seizures. Sleep study recommendation was to decrease any medications that could cause confusion, he only takes neurontin at night. Can trial stopping that but doubtful this is the cause. May consider an ambulatory eeg.   Interval history 03/04/2015:He continues to significant persistent episodes of sleep walking and confusion at night. Last night they went to bed at 10pm. He woke up last night at 1:30am. He was standing at the sink. He said he was helping people build a pond. He talks back to his  wife but he does not remember these episodes. He urinated on the side of the bed. She got him back to bed and he got up again due to RLS, he doesn't remember all of it. He went to the refrigerator and she followed him down. He didn' t know where the fridge was. He remembers getting water but doesn't know why he was so confused. He tosses and tumbles, he doesn't usually remembr the episodes at night. He does around 2 or 4am not knowing which one was the hot or cold spicket. He can't find the door knob at night. He remembers some episodes but others he does not. The wife has gotten him out of the closet many times when he gets lost at night. He can't get out of the closet.   Thought possibly this was REM sleep disorder but clonazepam made it worse. He sas tried melatonin which also made things worse. The melatonin and clonazepam have made things worse. But these episodes started even before he tried medication. Unclear if this is a medication effect or not.  He took [place] and destroyed the lamps one night.   Interval update 11/03/2014:  Patient is sleepwalking. He wakes up and moves. For example, he told his wife he was looking for a lantern. A lot of times he remembers sometimes he doesnt. Usually wife will ask a question and he can give her an answer. Sometimes there is a little agitation, may hit the bed. Agitation is rare, mostly confusional wandering. Last night he was in the kitchen looking for water.   His feet are hurting. The bottoms are tingly. He has to stand up and walk around. He tries to sit down and read which helps some. The bottom of his right foot feels sore. It has been going on for 5 years.   Initial visit 08/03/2014: [facility] is a 83 y.o. male here as a referral from Dr. Jacky Kindle for Parkinson's disease, RLS and memory changes. He is 83 years old and is a former patient of Dr. Hosie Poisson and is transitioning to me. He was found to have B12 deficiency and started on B12 injections. At last appointment was starte don low-dose Sinemet and titrated to one pill three times daily.  Wife provides most history and updates. Things are gong very well. He is a "whole different person" when he is on the medicine. Can see a significant change and can tell when it wears off. They are taking the medications at 8:30am and is taking it 3x a day a whole pill. Tremor is improving and helps significantly. Still taking the injections. He has some sleeping problems. He takes Ropinerole and gabapentin at night. Usually he sleeps well when he gets to sleep. But he has some difficulty initiating sleep.   Currently taking Neurontin and Requip at bedtime for possible RLS. Is now currently taking Requip 1.5mg  and Gabapentin 300mg  nightly. Tried Gabapentin 600mg  and had hallucinations. Had tried higher dose of Requip but also suffered from hallucinations.   Has had a few TIAs in the past. No strokes. Otherwise healthy.   No family history of  parkinson's or other neurodegenerative disorders   Basic lab work, including TSH, reviewed from PCP and was unremarkable.  Review of Systems: Patient complains of symptoms per HPI as well as the following symptoms: no CP, no SOB. Pertinent negatives per HPI. All others negative.    Social History   Social History  . Marital status: Married    Spouse name:  Ann  . Number of children: 3  . Years of education: AA degree   Occupational History  . retired from [employer]    Social History Main Topics  . Smoking status: Never Smoker  . Smokeless tobacco: Never Used  . Alcohol use No  . Drug use: No  . Sexual activity: Not on file   Other Topics Concern  . Not on file   Social History Narrative   Patient is married to [name](Ann), has 3 children    Patient is right handed   Education is AA degree   Caffeine consumption is 1 cup daily    Family History  Problem Relation Age of Onset  . Alzheimer's disease Father   . Dementia Father   . Allergies Brother   . Heart disease Sister   . Stroke Sister   . Clotting disorder Sister     Past Medical History:  Diagnosis Date  . B12 deficiency   . Cognitive decline   . GERD (gastroesophageal reflux disease)   . Hemorrhoids   . Hyperlipidemia   . Interstitial lung disease (HCC)   . Memory loss   . 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 Osteoarthritis   . Parkinson disease (HCC)   . Pleural thickening   . Restless legs syndrome (RLS)     Past Surgical History:  Procedure Laterality Date  . CATARACT EXTRACTION  03/2008  . COLONOSCOPY    . EXAMINATION UNDER ANESTHESIA  08/27/2012   Procedure: EXAM UNDER ANESTHESIA;  Surgeon: Maisie Fushomas A. Cornett, MD;  Location: [facility];  Service: General;  Laterality: N/A;  . HEMORRHOID SURGERY  06/21/2012   Procedure: HEMORRHOIDECTOMY;  Surgeon: Clovis Puhomas A. Cornett, MD;  Location: [facility];  Service: General;  Laterality: N/A;   . LUNG DECORTICATION  1962    Current Outpatient Prescriptions  Medication Sig Dispense Refill  . acetaminophen (TYLENOL) 325 MG tablet Take 650 mg by mouth as needed.      Marland Kitchen. albuterol (PROAIR HFA) 108 (90 Base) MCG/ACT inhaler Inhale 2 puffs into the lungs every 6 (six) hours as needed for wheezing or shortness of breath. 1 Inhaler 3  . aspirin 81 MG tablet Take 81 mg by mouth daily.      . carbidopa-levodopa (SINEMET IR) 25-100 MG tablet take 1 tablet by mouth three times a day 90 tablet 11  . ergocalciferol (VITAMIN D2) 50000 UNITS capsule Take 50,000 Units by mouth once a week.      . fluticasone (FLONASE) 50 MCG/ACT nasal spray 2 sprays by Nasal route daily.      Marland Kitchen. guaiFENesin (MUCINEX) 600 MG 12 hr tablet Take 600 mg by mouth as needed.     . memantine (NAMENDA) 10 MG tablet Take 1 tablet (10 mg total) by mouth 2 (two) times daily. 60 tablet 11  . OVER THE COUNTER MEDICATION Probiotic  One daily    . pantoprazole (PROTONIX) 40 MG tablet Take 40 mg by mouth daily.    . polyethylene glycol (MIRALAX / GLYCOLAX) packet Take 17 g by mouth daily.    . Polyethylene Glycol 3350 GRAN by Does not apply route.    Marland Kitchen. QUEtiapine (SEROQUEL) 25 MG tablet Take 3 tablets (75 mg total) by mouth at bedtime. (Patient taking differently: Take 25 mg by mouth daily. ) 90 tablet 12  . QUEtiapine (SEROQUEL) 50 MG tablet Take 150 mg by mouth at bedtime. 700pm    . rOPINIRole (REQUIP) 1 MG  tablet take 1 and 1/2 tablets by mouth at bedtime 45 tablet 5  . saw palmetto 80 MG capsule Take 80 mg by mouth daily.    . SEREVENT DISKUS 50 MCG/DOSE diskus inhaler INHALE 1 PUFF INTO THE LUNGS TWICE A DAY. 1 Inhaler 2  . UNABLE TO FIND Osteo Bi-flex - as needed for joint pain    . vitamin B-12 (CYANOCOBALAMIN) 1000 MCG tablet Take 1,000 mcg by mouth daily.    . Vortioxetine HBr (TRINTELLIX) 10 MG TABS Take 1 tablet (10 mg total) by mouth every morning. 30 tablet 4   No current facility-administered medications for this  visit.     Allergies as of 07/31/2017 - Review Complete 07/04/2017  Allergen Reaction Noted  . Advair diskus [fluticasone-salmeterol]  05/16/2012  . Aloe Swelling 07/22/2012  . Ambien [zolpidem tartrate] Other (See Comments) 05/01/2012  . Halcion [triazolam]  08/03/2014  . Horizant [gabapentin]  08/03/2014  . Lyrica [pregabalin] Other (See Comments) 05/01/2012  . Sulfonamide derivatives    . Ciprofloxacin Rash     Vitals: There were no vitals taken for this visit. Last Weight:  Wt Readings from Last 1 Encounters:  01/29/17 152 lb 12.8 oz (69.3 kg)   Last Height:   Ht Readings from Last 1 Encounters:  01/29/17 5\' 6"  (1.676 m)    Coordination: intermittent rest tremor noted in right hand (including with distraction) improved with Sinemet, mild postural tremor L>RUE, mild bradykinesia with finger tapping bilateral, normal foot taps bilat.  Gait:  stands without assistance, upright, very slight shuffling of steps, normal arm swing, negative Romberg, negative pull test  Tone:  Increased on the right with facilitation, normal on the left    Assessment/Plan: [facility] is a 83 y.o. male here as a follow up. He has Parkinson's with good response to Sinemet, dementia, RLS, B12 deficiency.He was having confusional episodes at night. Klonopin and melationin worsened symptoms, Nuplazid did not work, Seroquel helping quite a bit. He was diagnosed with very mild sleep apnea There was no significant oxygen desaturation only 8.7 minutes of the whole night were spent with desaturated settings. Periodic limb movements were frequent and arousals from periodic limb movements were 5 per hour. The majority of arousals was from spontaneous arousals. His requip was decreased at night to see if this was causing the confusion however he did not do well and it had to be increased again. A 3-day VEEG did not show seizre activity. He is doing well well currently.  Confusion at night  likely due to his Parkinson's disease and Parkinson's disease dementia. Dr. Vickey Huger started Seroquel which I think is a good choice, Dr. Mervyn Skeeters has increased with good results. Continue Sinemet at current dose. He was started on SSRI for his depression which is helping with mood.   Will try Clonazepam low dose at bedtime for REM sleep disorder and RLS. Discussed that this medication can cause increased risk of falls, sedation and in the past he has taken a similar medication with behavioral problems. They understand the risks, we'll start a very low dose, because the REM sleep disorder and the restless leg syndrome is bothering patient and wife. If there are any side effects please stop. Can increase as well if needed and tolerated. He may have tried clonazepam in the past will try it again at a very low dose.  Doing well on Exelon patch, Continue Namenda  They had bayada comes in as well as a Child psychotherapist, a physical therapist. They  had home safety inspection. They have grab bars.   Discussed HCPOA and POA  Continue Sinemet.  Naomie Dean, MD  [facility] [address] [place], [place] [address]  Phone [phone] Fax [phone]  A total of 15 minutes was spent face-to-face with this patient. Over half this time was spent on counseling patient on the dementia diagnosis and different diagnostic and therapeutic options available.

## 2017-08-01 ENCOUNTER — Telehealth: Payer: Self-pay | Admitting: Neurology

## 2017-08-01 MED ORDER — QUETIAPINE FUMARATE 50 MG PO TABS: 150.0000 mg | ORAL_TABLET | Freq: Every day | ORAL | 11 refills | Status: DC

## 2017-08-01 MED ORDER — QUETIAPINE FUMARATE 25 MG PO TABS: ORAL_TABLET | ORAL | 12 refills | Status: DC

## 2017-08-01 NOTE — Telephone Encounter (Signed)
Called and spoke with pt's [place] She states that prior to the pt's office visit on 10/30, he was taking 25 mg of seroquel in the afternoon and 150 mg ( 50 mg tablet x 3) in evening. Currently the new prescription of 25 mg tablets says to take 1 tablet in the afternoon and 3 at night.   I spoke with Dr. Lucia GaskinsAhern. She is continuing the patients regimen of Seroquel: 25 mg in afternoon and 50 mg x 3 for total evening dose of 150 mg and will send in prescriptions.  I called Ann back and informed her of the change. She verbalized understanding that the patient is now taking Clonazepam and will continue his regular regimen of Seroquel.

## 2017-08-01 NOTE — Telephone Encounter (Signed)
Patient is calling to advise the patient takes one QUEtiapine (SEROQUEL) 25 MG tablet at 2pm. At bedtime he takes three 50mg  Seroquel tablets. Please call and discuss.

## 2017-08-01 NOTE — Addendum Note (Signed)
Addended by: Naomie DeanAHERN, Ciel Chervenak B on: 08/01/2017 12:02 PM   Modules accepted: Orders

## 2017-08-25 ENCOUNTER — Encounter (HOSPITAL_BASED_OUTPATIENT_CLINIC_OR_DEPARTMENT_OTHER): Payer: Self-pay | Admitting: Emergency Medicine

## 2017-08-25 ENCOUNTER — Other Ambulatory Visit: Payer: Self-pay

## 2017-08-25 ENCOUNTER — Emergency Department (HOSPITAL_BASED_OUTPATIENT_CLINIC_OR_DEPARTMENT_OTHER)
Admission: EM | Admit: 2017-08-25 | Discharge: 2017-08-25 | Disposition: A | Discharge status: Home / Self Care | Payer: Medicare Other | Attending: Emergency Medicine | Admitting: Emergency Medicine

## 2017-08-25 DIAGNOSIS — W19XXXA Unspecified fall, initial encounter: Secondary | ICD-10-CM

## 2017-08-25 DIAGNOSIS — Y929 Unspecified place or not applicable: Secondary | ICD-10-CM | POA: Diagnosis not present

## 2017-08-25 DIAGNOSIS — S0993XA Unspecified injury of face, initial encounter: Secondary | ICD-10-CM | POA: Diagnosis present

## 2017-08-25 DIAGNOSIS — W06XXXA Fall from bed, initial encounter: Secondary | ICD-10-CM | POA: Diagnosis not present

## 2017-08-25 DIAGNOSIS — Z7982 Long term (current) use of aspirin: Secondary | ICD-10-CM | POA: Insufficient documentation

## 2017-08-25 DIAGNOSIS — Y9389 Activity, other specified: Secondary | ICD-10-CM | POA: Diagnosis not present

## 2017-08-25 DIAGNOSIS — F0281 Dementia in other diseases classified elsewhere with behavioral disturbance: Secondary | ICD-10-CM | POA: Diagnosis not present

## 2017-08-25 DIAGNOSIS — S0181XA Laceration without foreign body of other part of head, initial encounter: Secondary | ICD-10-CM | POA: Diagnosis not present

## 2017-08-25 DIAGNOSIS — E785 Hyperlipidemia, unspecified: Secondary | ICD-10-CM | POA: Insufficient documentation

## 2017-08-25 DIAGNOSIS — Y999 Unspecified external cause status: Secondary | ICD-10-CM | POA: Diagnosis not present

## 2017-08-25 DIAGNOSIS — J449 Chronic obstructive pulmonary disease, unspecified: Secondary | ICD-10-CM | POA: Diagnosis not present

## 2017-08-25 DIAGNOSIS — Z79899 Other long term (current) drug therapy: Secondary | ICD-10-CM | POA: Diagnosis not present

## 2017-08-25 DIAGNOSIS — G20 Parkinson's disease: Secondary | ICD-10-CM | POA: Diagnosis not present

## 2017-08-25 NOTE — ED Triage Notes (Signed)
Patient states that he woke up about midnight last night and hit his left forehead - patient has a laceration noted to his left forehead

## 2017-08-25 NOTE — Discharge Instructions (Signed)
If patient begins to have difficulty walking, worsening confusion, any difficulty using just one side of his body he would need to return for a CAT scan.

## 2017-08-25 NOTE — ED Provider Notes (Signed)
[facility] EMERGENCY DEPARTMENT Provider Note   CSN: [id number] Arrival date & time: 08/25/17  1239     History   Chief Complaint Chief Complaint  Patient presents with  . Fall    HPI Gena FrayCharles E Odeh is a 83 y.o. male.  Patient is an 83 year old male with a history of hyperlipidemia, Parkinson's disease, dementia, GERD presenting today with his son with an injury to his face.  Son states he rolled out of bed last night landing face down on the floor which could have cut above his left eye or could have possibly hit a bedside table.  Patient is otherwise been acting his normal self.  He has had no difficulty ambulating and has not recently had increased number of falls, worsening confusion or infectious symptoms.  Patient does not take anticoagulation.  He has no complaints at this time.  He states his vision has not changed.  His tetanus shot is up-to-date   The history is provided by the patient and a caregiver.  Fall  This is a recurrent problem. The current episode started 12 to 24 hours ago. The problem occurs constantly. The problem has not changed since onset.Associated symptoms comments: Laceration over the left eye. Nothing aggravates the symptoms. Nothing relieves the symptoms. He has tried nothing for the symptoms.    Past Medical History:  Diagnosis Date  . B12 deficiency   . Cognitive decline   . GERD (gastroesophageal reflux disease)   . Hemorrhoids   . Hyperlipidemia   . Interstitial lung disease (HCC)   . Memory loss   . 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 Osteoarthritis   . Parkinson disease (HCC)   . Pleural thickening   . Restless legs syndrome (RLS)     Patient Active Problem List   Diagnosis Date Noted  . Dysphagia 03/03/2016  . CAP (community acquired pneumonia) 02/29/2016  . Dementia due to Parkinson's disease with behavioral disturbance (HCC) 09/13/2015  . Parkinson's disease dementia  (HCC) 07/18/2015  . 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 RLS (restless legs syndrome) 03/05/2015  . B12 deficiency 03/05/2015  . Hereditary and idiopathic peripheral neuropathy 03/05/2015  . Parkinson disease (HCC) 03/05/2015  . Parasomnia 03/04/2015  . Parkinsonism (HCC) 08/03/2014  . Post-operative state 09/16/2012  . Acute sinusitis 08/16/2012  . Anal stenosis 08/12/2012  . Acute bronchitis 05/01/2012  . Hemorrhoids 03/08/2012  . Hemorrhoids 02/05/2012  . COPD (chronic obstructive pulmonary disease) (HCC) 05/29/2011  . PERSISTENT DISORDER INITIATING/MAINTAINING SLEEP 08/30/2009  . UNSPEC ALVEOLAR&PARIETOALVEOLAR PNEUMONOPATHY 06/02/2009  . HYPERLIPIDEMIA 06/01/2009  . RESTLESS LEG SYNDROME 06/01/2009  . GERD 06/01/2009  . OSTEOARTHRITIS 06/01/2009    Past Surgical History:  Procedure Laterality Date  . CATARACT EXTRACTION  03/2008  . COLONOSCOPY    . EXAMINATION UNDER ANESTHESIA  08/27/2012   Procedure: EXAM UNDER ANESTHESIA;  Surgeon: Maisie Fushomas A. Cornett, MD;  Location: [facility];  Service: General;  Laterality: N/A;  . HEMORRHOID SURGERY  06/21/2012   Procedure: HEMORRHOIDECTOMY;  Surgeon: Clovis Puhomas A. Cornett, MD;  Location: [facility];  Service: General;  Laterality: N/A;  . LUNG DECORTICATION  1962       Home Medications    Prior to Admission medications   Medication Sig Start Date End Date Taking? Authorizing Provider  acetaminophen (TYLENOL) 325 MG tablet Take 650 mg by mouth as needed.      [provider]  albuterol [facility](PROAIR  HFA) 108 (90 Base) MCG/ACT inhaler Inhale 2 puffs into the lungs every 6 (six) hours as needed for wheezing or shortness of breath. 11/15/15   Leslye Peer, MD  aspirin 81 MG tablet Take 81 mg by mouth daily.      [provider]  carbidopa-levodopa (SINEMET IR) 25-100 MG tablet Take 1 tablet by mouth 3 (three) times daily. 07/31/17   Anson Fret, MD   clonazePAM (KLONOPIN) 0.5 MG tablet Take 0.5 tablets (0.25 mg total) by mouth at bedtime. 07/31/17   Anson Fret, MD  ergocalciferol (VITAMIN D2) 50000 UNITS capsule Take 50,000 Units by mouth once a week.      [provider]  fluticasone (FLONASE) 50 MCG/ACT nasal spray 2 sprays by Nasal route daily.      [provider]  guaiFENesin (MUCINEX) 600 MG 12 hr tablet Take 600 mg by mouth as needed.     [provider]  memantine (NAMENDA) 10 MG tablet Take 1 tablet (10 mg total) by mouth 2 (two) times daily. 07/31/17   Anson Fret, MD  OVER THE COUNTER MEDICATION Probiotic  One daily    [provider]  pantoprazole (PROTONIX) 40 MG tablet Take 40 mg by mouth daily. 06/29/16   [provider]  polyethylene glycol (MIRALAX / GLYCOLAX) packet Take 17 g by mouth daily.    [provider]  Polyethylene Glycol 3350 GRAN by Does not apply route.    [provider]  QUEtiapine (SEROQUEL) 25 MG tablet Take one pill in the afternoon. 08/01/17   Anson Fret, MD  QUEtiapine (SEROQUEL) 50 MG tablet Take 3 tablets (150 mg total) by mouth at bedtime. 08/01/17   Anson Fret, MD  rOPINIRole (REQUIP) 1 MG tablet take 1 and 1/2 tablets by mouth at bedtime 01/10/17   Anson Fret, MD  saw palmetto 80 MG capsule Take 80 mg by mouth daily.    [provider]  SEREVENT DISKUS 50 MCG/DOSE diskus inhaler INHALE 1 PUFF INTO THE LUNGS TWICE A DAY. 06/11/17   Byrum, Les Pou, MD  UNABLE TO FIND Osteo Bi-flex - as needed for joint pain    [provider]  vitamin B-12 (CYANOCOBALAMIN) 1000 MCG tablet Take 1,000 mcg by mouth daily.    [provider]  Vortioxetine HBr (TRINTELLIX) 10 MG TABS Take 1 tablet (10 mg total) by mouth every morning. 12/29/15   Archer Asa, MD    Family History Family History  Problem Relation Age of Onset  . Alzheimer's disease Father   . Dementia Father   . Allergies Brother   .  Heart disease Sister   . Stroke Sister   . Clotting disorder Sister     Social History Social History   Tobacco Use  . Smoking status: Never Smoker  . Smokeless tobacco: Never Used  Substance Use Topics  . Alcohol use: No  . Drug use: No     Allergies   Advair diskus [fluticasone-salmeterol]; Aloe; Ambien [zolpidem tartrate]; Halcion [triazolam]; Horizant [gabapentin]; Lyrica [pregabalin]; Sulfonamide derivatives; and Ciprofloxacin   Review of Systems Review of Systems  All other systems reviewed and are negative.    Physical Exam Updated Vital Signs BP (!) 156/64 (BP Location: Left Arm)   Pulse 70   Temp 98.4 F (36.9 C) (Oral)   Resp 20   Ht 5\' 6"  (1.676 m)   Wt 68 kg (150 lb)   SpO2 95%   BMI 24.21 kg/m  Physical Exam  Constitutional: He appears well-developed and well-nourished. No distress.  HENT:  Head: Normocephalic. Head is with laceration.    Mouth/Throat: Oropharynx is clear and moist.  Eyes: Conjunctivae and EOM are normal. Pupils are equal, round, and reactive to light.  Neck: Normal range of motion. Neck supple. Muscular tenderness present.    Minimal paracervical muscular tenderness but no centralized spinal tenderness or step-offs  Cardiovascular: Normal rate, regular rhythm and intact distal pulses.  No murmur heard. Pulmonary/Chest: Effort normal and breath sounds normal. No respiratory distress. He has no wheezes. He has no rales.  Abdominal: Soft. He exhibits no distension. There is no tenderness. There is no rebound and no guarding.  Musculoskeletal: Normal range of motion. He exhibits no edema or tenderness.  Neurological: He is alert.  Oriented to person and place  Skin: Skin is warm and dry. No rash noted. No erythema.  Psychiatric: He has a normal mood and affect. His behavior is normal.  Nursing note and vitals reviewed.    ED Treatments / Results  Labs (all labs ordered are listed, but only abnormal results are  displayed) Labs Reviewed - No data to display  EKG  EKG Interpretation None       Radiology No results found.  Procedures Procedures (including critical care time)  LACERATION REPAIR Performed by: [employer] Kindall Swaby Authorized by: Gwyneth SproutWhitney Barrie Wale Consent: Verbal consent obtained. Risks and benefits: risks, benefits and alternatives were discussed Consent given by: patient Patient identity confirmed: provided demographic data Prepped and Draped in normal sterile fashion Wound explored  Laceration Location: superior to the left eyebrow  Laceration Length: 3 cm  No Foreign Bodies seen or palpated  Anesthesia:none Irrigation method: syringe Amount of cleaning: standard  Skin closure: dermabond and steristrips   Patient tolerance: Patient tolerated the procedure well with no immediate complications.   Medications Ordered in ED Medications - No data to display   Initial Impression / Assessment and Plan / ED Course  I have reviewed the triage vital signs and the nursing notes.  Pertinent labs & imaging results that were available during my care of the patient were reviewed by me and considered in my medical decision making (see chart for details).     Patient with a mechanical fall around a.m. this morning when he rolled out of bed.  He has a laceration over the left eyebrow that is currently hemostatic.  It is not gaping and it does not appear to be extremely deep.  Will repair with Dermabond and Steri-Strips.   Patient does not take anticoagulation has active his normal self and is now 13 hours out from the initial injury.  Feel that it is reasonable for patient to not undergo CT at this time and that was discussed with family and they are comfortable with that.  Gave strict return precautions if he does develop a difficulty with gait, worsening confusion or other symptoms concerning for intracranial hemorrhage they will return for CT.  Final Clinical Impressions(s) /  ED Diagnoses   Final diagnoses:  Fall, initial encounter  Facial laceration, initial encounter    ED Discharge Orders    None       Gwyneth SproutPlunkett, Jamorion Gomillion, MD 08/25/17 1425

## 2017-08-25 NOTE — ED Notes (Signed)
Pt d/c home with family. Ice pack given for home use 

## 2017-09-05 ENCOUNTER — Telehealth: Payer: Self-pay | Admitting: Neurology

## 2017-09-05 NOTE — Telephone Encounter (Signed)
Patients wife is calling and has some concerns that she is wanting to make Dr Lucia GaskinsAhern aware of. Over the last couple weeks the patient has rolled out of the bed and hit his head, he was evaluated in [place] and had his cut glued shut. He has been experiencing some sleepiness during the day and sleep walking at night. He has had episodes of urine incontinency. She states that when she is getting him to bed he tends to freeze and lock up more when trying to get in the bed. The wife would like a call back to address these issues and see if there needs to be any medication adjustments or if they should just keep an eye on him.

## 2017-09-06 NOTE — Telephone Encounter (Signed)
Dr. Lucia GaskinsAhern aware of pt's symptoms reported by pt's wife. Patient was recently started on Clonazepam at last office visit. Per Dr. Lucia GaskinsAhern, patient should stop Clonazepam if he his currently taking it.   Called and LVM asking for Wesley AbrahamsMary Pierce (wife on DPR) to call back. Left office number.

## 2017-09-06 NOTE — Telephone Encounter (Signed)
I spoke with patient's wife and made her aware that he should stop taking the Clonazepam. She voiced understanding and appreciation.

## 2017-09-06 NOTE — Telephone Encounter (Signed)
If he is still on the clonazepam at night (for rem sleep disorder) have them stop it thanks

## 2017-09-13 ENCOUNTER — Other Ambulatory Visit: Payer: Self-pay | Admitting: Emergency Medicine

## 2017-10-08 ENCOUNTER — Other Ambulatory Visit: Payer: Self-pay | Admitting: Neurology

## 2017-10-12 ENCOUNTER — Other Ambulatory Visit: Payer: Self-pay | Admitting: Pulmonary Disease

## 2017-11-12 ENCOUNTER — Other Ambulatory Visit: Payer: Self-pay | Admitting: Emergency Medicine

## 2017-11-12 ENCOUNTER — Telehealth: Payer: Self-pay | Admitting: Emergency Medicine

## 2017-11-12 NOTE — Telephone Encounter (Signed)
atc pt X2, line rang twice and then went to a fast busy signal.  Wcb.

## 2017-11-13 MED ORDER — SALMETEROL XINAFOATE 50 MCG/DOSE IN AEPB: INHALATION_SPRAY | RESPIRATORY_TRACT | 0 refills | Status: DC

## 2017-11-13 NOTE — Telephone Encounter (Signed)
Called patients wife, advised her that we would send in a prescription for patient, and he still needs to keep appointment with RB on 2.20.19. Nothing further needed.

## 2017-11-21 ENCOUNTER — Encounter: Payer: Self-pay | Admitting: Emergency Medicine

## 2017-11-21 ENCOUNTER — Ambulatory Visit: Payer: Medicare Other | Admitting: Emergency Medicine

## 2017-11-21 DIAGNOSIS — J439 Emphysema, unspecified: Secondary | ICD-10-CM

## 2017-11-21 DIAGNOSIS — J301 Allergic rhinitis due to pollen: Secondary | ICD-10-CM

## 2017-11-21 DIAGNOSIS — R1312 Dysphagia, oropharyngeal phase: Secondary | ICD-10-CM

## 2017-11-21 MED ORDER — ALBUTEROL SULFATE HFA 108 (90 BASE) MCG/ACT IN AERS: 2.0000 | INHALATION_SPRAY | Freq: Four times a day (QID) | RESPIRATORY_TRACT | 5 refills | Status: AC | PRN

## 2017-11-21 NOTE — Assessment & Plan Note (Signed)
Please continue to take Serevent twice a day. Keep albuterol available to use 2 puffs if needed for shortness of breath.  We will refill this medication. You can use Mucinex 600 mg twice a day if needed to help with mucus clearance, sputum clearance.

## 2017-11-21 NOTE — Progress Notes (Signed)
Subjective:    Patient ID: Wesley Pierce, male    DOB: 12-25-1930, 84 y.o.   MRN: [id number]  HPI 84 year old gentleman who follows up for COPD, some chronic right basilar scar and restless leg syndrome. Also with a hx of a remote L decortication, ? Due to empyema. He has been followed by Dr. Shelle Iron. He also sees neurology for vitamin B-12 deficiency and early parkinson's. He is currently managed on Foradil, only taking it qd right now. He forgets it, also the insurance cost has been problematic. His restless leg syndrome is treated with Requip 1.5 mg daily at bedtime.  He describes stable breathing, good exercise tolerance. He is able to sing in chorus.  His restless legs are active - he just had a repeat PSG last week. Also Sleep walking is a concern, ? Related to degree of his medications, gabapentin, etc. History today is taken from the patient and from his wife.   ROV 11/09/15 -- patient with a history of COPD. He also has restless leg syndrome and nocturnal sleep ambulation has been followed by Dr. Vickey Huger.  He is much improved since starting seroquel. Also on rivastigmine, sinemet, requip.  Last time we had to replace foradil to serevent discus. Hasn't seemed to lose any ground. He may have a bit more cough. He can walk around the block, but is unable to jog.   ROV 11/15/16 -- Follow-up visit for patient with a history Parkinson's, COPD, restless leg syndrome. Also with a history of a remote left decortication that I believe was for an empyema. He was admitted in May for a R basilar PNA suspicious for aspiration. He has dys[phagia from his Parkinson's. He has been instructed to use a straw for any drink. He believes that his breathing has improved. He has Serevent bid, has albuterol but has not needed. No wheeze, minimal cough. Able ot do some yard work. Feels that his exercise tolerance has built back up since the PNA. Flu shot up to date.   ROV 11/21/17 --this is a follow-up visit.   84 year old male with a history of Parkinson's, COPD, restless leg syndrome and a left decortication for an empyema.  He reports that he has been doing fairly well, wife agrees. He has had some cough lately, ? URI. Has been on mucinex x 1 month - has helped him some. He remains on serevent bid. Never has used albuterol. Needs a refill for expired HFA. No hospitalizations, no pred, no abx. Flu shot up to date. Pneumonia shots UTD. No dysphagia. Uses a straw per SLP recs. He uses flonase qd - interested in possibly stopping it    Review of Systems As per HPI     Objective:   Physical Exam Vitals:   11/21/17 1345  BP: 132/86  Pulse: 74  SpO2: 98%  Weight: 146 lb (66.2 kg)  Height: 5\' 6"  (1.676 m)   Gen: Pleasant, well-nourished, in no distress,  normal affect  ENT: No lesions,  mouth clear,  oropharynx clear, no postnasal drip  Neck: No JVD, no stridor  Lungs: No use of accessory muscles, no wheezes or crackles  Cardiovascular: RRR, heart sounds normal, no murmur or gallops, no peripheral edema  Musculoskeletal: No deformities, no cyanosis or clubbing  Neuro: alert, non focal, some forgetfulness and difficulty with short-term memory  Skin: Warm, no lesions or rashes      Assessment & Plan:  COPD (chronic obstructive pulmonary disease) Please continue to take Serevent twice a day. Keep  albuterol available to use 2 puffs if needed for shortness of breath.  We will refill this medication. You can use Mucinex 600 mg twice a day if needed to help with mucus clearance, sputum clearance.  Allergic rhinitis He is interested in trying to stop fluticasone, change to as needed.  We will do this and follow his symptoms.  Dysphagia Continue to use a straw for liquids as was recommended by speech therapy.   Levy Pupaobert Suzanne Kho, MD, PhD 11/21/2017, 2:02 PM [facility] [phone] or if no answer [phone]

## 2017-11-21 NOTE — Assessment & Plan Note (Signed)
Continue to use a straw for liquids as was recommended by speech therapy.

## 2017-11-21 NOTE — Assessment & Plan Note (Signed)
He is interested in trying to stop fluticasone, change to as needed.  We will do this and follow his symptoms.

## 2017-11-21 NOTE — Patient Instructions (Signed)
Please continue to take Serevent twice a day. Keep albuterol available to use 2 puffs if needed for shortness of breath.  We will refill this medication. You can use Mucinex 600 mg twice a day if needed to help with mucus clearance, sputum clearance. Use fluticasone nasal spray 2 sprays each nostril daily if needed for nasal congestion, cough. Your pneumonia shots are up-to-date Your flu shot is up-to-date Continue to use a straw for liquids as was recommended by speech therapy. Follow with [name] in 12 months or sooner if you have any problems

## 2017-12-12 ENCOUNTER — Other Ambulatory Visit: Payer: Self-pay | Admitting: Emergency Medicine

## 2018-03-28 ENCOUNTER — Telehealth: Payer: Self-pay | Admitting: Neurology

## 2018-03-28 NOTE — Telephone Encounter (Signed)
Spoke with pt's wife Dewayne Hatchnn. Pt is currently taking 150 mg of seroquel at night. She said he got overstimulated yesterday and had a really hard time getting to sleep. She has noticed that this has gradually become and issue for him but they haven't anything like last night in awhile. She is ok with increasing seroquel if that is what Dr. Lucia GaskinsAhern recommends.

## 2018-03-28 NOTE — Telephone Encounter (Signed)
Pt wife(on DPR-Ann Quaintance [phone])  has called to inform that normally pt sleeps through the morning but yesterday pt did not and the family had a very difficult time getting pt to bed last night.  Wife is asking if there is something that can be given to pt to help in the process of getting him to go to bed at night.  Please call.  If something is called in wife would like to use  [facility] [place] - [address] [phone] (Phone) [phone] (Fax)

## 2018-03-29 NOTE — Telephone Encounter (Signed)
Called Wesley Pierce back and discussed that pt is already on Seroquel 150 mg which is a high dose so Dr. Lucia GaskinsAhern wants to hold off on increasing. Wife did confirm pt is sleeping a lot during the day. Encouraged her to keep pt awake more during the day so he is more tired at night. She stated she does want to overstimulate him during the day either. RN encouraged wife to call back if this does not work. Wesley Pierce verbalized understanding and appreciation.

## 2018-03-29 NOTE — Telephone Encounter (Signed)
That is a high dose of seroquel I would hold off. If he sleeps a lot during the day it is going to be hard to get him to go to sleep during the day. Ask how long he sleeps during the day and if they can keep him more awake so he is more tired at night.

## 2018-04-30 ENCOUNTER — Ambulatory Visit: Payer: Medicare Other | Admitting: Neurology

## 2018-06-18 ENCOUNTER — Other Ambulatory Visit: Payer: Self-pay | Admitting: Emergency Medicine

## 2018-06-24 ENCOUNTER — Telehealth: Payer: Self-pay | Admitting: Psychology

## 2018-06-24 ENCOUNTER — Encounter: Payer: Self-pay | Admitting: Neurology

## 2018-06-24 ENCOUNTER — Ambulatory Visit: Payer: Medicare Other | Admitting: Neurology

## 2018-06-24 VITALS — BP 129/73 | HR 57 | Ht 66.0 in | Wt 138.0 lb

## 2018-06-24 DIAGNOSIS — F513 Sleepwalking [somnambulism]: Secondary | ICD-10-CM | POA: Diagnosis not present

## 2018-06-24 DIAGNOSIS — F29 Unspecified psychosis not due to a substance or known physiological condition: Secondary | ICD-10-CM

## 2018-06-24 DIAGNOSIS — G20A1 Parkinson's disease without dyskinesia, without mention of fluctuations: Secondary | ICD-10-CM

## 2018-06-24 DIAGNOSIS — G20 Parkinson's disease: Secondary | ICD-10-CM

## 2018-06-24 DIAGNOSIS — F02818 Dementia in other diseases classified elsewhere, unspecified severity, with other behavioral disturbance: Secondary | ICD-10-CM

## 2018-06-24 DIAGNOSIS — F0281 Dementia in other diseases classified elsewhere with behavioral disturbance: Secondary | ICD-10-CM | POA: Diagnosis not present

## 2018-06-24 MED ORDER — MEMANTINE HCL 10 MG PO TABS: 10.0000 mg | ORAL_TABLET | Freq: Two times a day (BID) | ORAL | 11 refills | Status: DC

## 2018-06-24 MED ORDER — QUETIAPINE FUMARATE 25 MG PO TABS: ORAL_TABLET | ORAL | 12 refills | Status: DC

## 2018-06-24 MED ORDER — ROPINIROLE HCL 1 MG PO TABS: ORAL_TABLET | ORAL | 9 refills | Status: AC

## 2018-06-24 MED ORDER — CARBIDOPA-LEVODOPA 25-100 MG PO TABS: 1.0000 | ORAL_TABLET | Freq: Three times a day (TID) | ORAL | 11 refills | Status: DC

## 2018-06-24 MED ORDER — QUETIAPINE FUMARATE 50 MG PO TABS: 150.0000 mg | ORAL_TABLET | Freq: Every day | ORAL | 11 refills | Status: DC

## 2018-06-24 NOTE — Progress Notes (Signed)
[facility]    Provider:  Dr Lucia Gaskins Referring Provider: [name], MD Primary Care Physician:  [name], MD    CC: Parkinson's Disease and Dementia  Interval history 06/24/2018: Things are "kind of rough". He continues to have difficulty with gait and memory loss. Frustration and depression at home. He is getting up multiple times in the evening, he wanders around the house. He naps during the day which is likely affecting his sleeping.  He has falls at night. Discussed using walking aids and trying to keep lights on at night. Discussed help in the home because they say when he is awake he "wears" them down - he is energized and this is difficult for family to handle. Discussed an Adult day center. Will refer to Child psychotherapist at [facility]. He has hallucinations, but improved since Seroquel. He coughs after drinking. He has had swallow evals in the past, they are using a straw he did not like the thickened, but no aspiration or pneumonias.   Interval history 07/31/2017: Son and wife here. He is having more problems with aphasia, the wrong word coming out. The last few weeks have been more pronounced, he can tell people about a table but can't remember the word "table" or calls it a door. He is eating a normal diet, not thickening fluids, he feels fine now not choking. More progressed over the last few days, unclear if confused. He has been on mucinex for ocngestion in the chest and sinuses. He is drinking enough fluids. Urine is light yellow. Still has difficulty sleeping. He gets into bed but then gets out. He can go to bed in and out 40 times. Will try Clonazepam at bedtime for RLS and REM sleep disorder.    Interval history 07/31/2016: MIKAEEL PETROW is a 84 y.o. male here as a follow up. He has Parkinson's disease dementia with good response to Sinemet and seroquel, dementia, RLS, B12 deficiency. The Seroquel has helped. Fewer nightmares. Feels  memory continues to worseni. He talks about not driving again. Seroquel is a "life saver" Dr. Jacky Kindle has increased it. He was diagnosed with pneumonia, he has had aspiration and was evaluated by speech and swallow. He has a difficult time getting the words out. He can eat anything he wants, he is using a straw for liquids, not thickened liquids anymore, he is coughing a little. He was a SNF for 5 weeks. He is home now and doing well. Not wandering at night. Sleeping is going well.    Interval history 01/18/2016: RYLYNN SCHONEMAN is a 84 y.o. male here as a follow up. He has Parkinson's disease dementia with good response to Sinemet and seroquel, dementia, RLS, B12 deficiency. The Seroquel has helped. Fewer nightmares. Wandering at night is not as bad. He feels like he should be driving. Discussed again, family has taken away keys and also agrees he should not be driving. He is slowly regressing at night. He has started wandering more though. Not taking naps. He goes to bed at 10pm and wakes randomly and wanders. He had nausea with the rivastigmine when tried to increase above 1.5mg  bid. Can't increase oral increase. No dysphagia. No falls. Sinemet helps with his tremor. Memory is stable. He tried Nuplazid but symptoms worsened. He is back on requip at night for his RLS, stopping it did not improve his confusional episodes at night and only worsened his RLS symptoms and periodic limb movements of sleep. He has had a sleep study  and prolonged EEG as well. I am glad the seroquel has been helpful, this can be further titrated if needed. Have discussed the side effects and black box warnings in the elderly for risk of increased mortality. Consider Namenda at a later time.   Interval history 09/13/2015: Gena FrayCharles E Zavaleta is a 84 y.o. male here as a follow up. He has Parkinson's with good response to Sinemet, dementia, RLS, B12 deficiency. The Seroquel has helped. Fewer nightmares. Wandering at night is not as  bad. He is upset his license has been taken away. He wants to drive. It is a hardship on the family. He has wandered outside. He was episodes of confusion. The family took away his keys and the keys to the house. There were some times in September he did not know who his son was. This is better. He has had some falls however. No dysphagia. Wife and son provide most of the information. Overall they are thrilled with his improvement. They also Have started an anti-depression medication which has helped. Sinemet helps with his tremor. Memory is stable. He tried Nuplazid but symptoms worsened. He is back on requip at night for his RLS, stopping it did not improve his confusional episodes at night and only worsened his RLS symptoms and periodic limb movements of sleep. He has had a sleep study and prolonged EEG as well. I am glad the seroquel has been helpful, this can be further titrated if needed. Have discussed the side effects and black box warnings in the elderly for risk of increased mortality. He is on Rivastigmine. Will increase today and consider Namenda at a later time.    Interval history 10/14/102016: Gena FrayCharles E Spada is a 84 y.o. male here as a follow up. He has Parkinson's with good response to Sinemet, dementia, RLS, B12 deficiency.He is having confusional episodes at night. Klonopin and melationin worsened symptoms, Nuplazid did not work. He was diagnosed with very mild sleep study. A 3-day VEEG did not show seizre activity. There was no significant oxygen desaturation only 8.7 minutes of the whole night were spent with desaturated settings. Periodic limb movements were frequent and arousals from periodic limb movements were 5 per hour. The majority of arousals was from spontaneous arousals. His requip was decreased at night to see if this was causing the confusion however he did not do well and it had to be increased again. He destroyed a lampshade in the den this past week. He has confusional  episodes, sleepy during the day.   Had a long talk with patient, his wife and son who was with him. He is confusional episodes are likely due to Parkinson's disease and Parkinson's disease dementia. Dr. Dyanne Ihaelmar started Seroquel at night and I did encourage them to try it. I tried to refer them to Dr. Alphonzo LemmingsGerald Plovski was a geriatric psychiatrist however he did not take their insurance, recommended possibly seeing Dr. Suzie PortelaPayne who is also a geriatric psychiatrist. I do feel that Seroquel is a good choice and this can be titrated as well.  Interval history 05/24/2015 : Gena FrayCharles E Hennon is a 84 y.o. male here as a follow up. He has Parkinson's with good response to Sinemet, dementia, RLS, B12 deficiency. He continues to sleep walk and recently had a sleep study. He is here to discuss his sleep study. However he does has a follow up with Dr. [name] on the 1st of September. I have explained to him that following up with Dr. [name] for sleep is appropriate  but I can give him an initial review. He continues to sleep walk. The other night he urinated on his couch in the middle of the night. Results showed that he was talking to himself and pulling out his lines during wakefullness, there was no REM sleep to evaluate for REM sleep disorder, EEG montage did not show seizures. Sleep study recommendation was to decrease any medications that could cause confusion, he only takes neurontin at night. Can trial stopping that but doubtful this is the cause. May consider an ambulatory eeg.   Interval history 03/04/2015:He continues to significant persistent episodes of sleep walking and confusion at night. Last night they went to bed at 10pm. He woke up last night at 1:30am. He was standing at the sink. He said he was helping people build a pond. He talks back to his wife but he does not remember these episodes. He urinated on the side of the bed. She got him back to bed and he got up again due to RLS, he doesn't remember all  of it. He went to the refrigerator and she followed him down. He didn' t know where the fridge was. He remembers getting water but doesn't know why he was so confused. He tosses and tumbles, he doesn't usually remembr the episodes at night. He does around 2 or 4am not knowing which one was the hot or cold spicket. He can't find the door knob at night. He remembers some episodes but others he does not. The wife has gotten him out of the closet many times when he gets lost at night. He can't get out of the closet.   Thought possibly this was REM sleep disorder but clonazepam made it worse. He sas tried melatonin which also made things worse. The melatonin and clonazepam have made things worse. But these episodes started even before he tried medication. Unclear if this is a medication effect or not. He took [place] and destroyed the lamps one night.   Interval update 11/03/2014:  Patient is sleepwalking. He wakes up and moves. For example, he told his wife he was looking for a lantern. A lot of times he remembers sometimes he doesnt. Usually wife will ask a question and he can give her an answer. Sometimes there is a little agitation, may hit the bed. Agitation is rare, mostly confusional wandering. Last night he was in the kitchen looking for water.   His feet are hurting. The bottoms are tingly. He has to stand up and walk around. He tries to sit down and read which helps some. The bottom of his right foot feels sore. It has been going on for 5 years.   Initial visit 08/03/2014: MIRACLE CRIADO is a 84 y.o. male here as a referral from Dr. Jacky Kindle for Parkinson's disease, RLS and memory changes. He is 84 years old and is a former patient of Dr. Hosie Poisson and is transitioning to me. He was found to have B12 deficiency and started on B12 injections. At last appointment was starte don low-dose Sinemet and titrated to one pill three times daily.  Wife provides most history and updates. Things are gong very  well. He is a "whole different person" when he is on the medicine. Can see a significant change and can tell when it wears off. They are taking the medications at 8:30am and is taking it 3x a day a whole pill. Tremor is improving and helps significantly. Still taking the injections. He has some sleeping problems. He takes  Ropinerole and gabapentin at night. Usually he sleeps well when he gets to sleep. But he has some difficulty initiating sleep.   Currently taking Neurontin and Requip at bedtime for possible RLS. Is now currently taking Requip 1.5mg  and Gabapentin 300mg  nightly. Tried Gabapentin 600mg  and had hallucinations. Had tried higher dose of Requip but also suffered from hallucinations.   Has had a few TIAs in the past. No strokes. Otherwise healthy.   No family history of parkinson's or other neurodegenerative disorders   Basic lab work, including TSH, reviewed from PCP and was unremarkable.  Review of Systems: Patient complains of symptoms per HPI as well as the following symptoms: no CP, no SOB. Pertinent negatives per HPI. All others negative.    Social History   Socioeconomic History  . Marital status: Married    Spouse name: [name]  . Number of children: 3  . Years of education: AA degree  . Highest education level: Not on file  Occupational History  . Occupation: retired from [employer]  . Financial resource strain: Not on file  . Food insecurity:    Worry: Not on file    Inability: Not on file  . Transportation needs:    Medical: Not on file    Non-medical: Not on file  Tobacco Use  . Smoking status: Never Smoker  . Smokeless tobacco: Never Used  Substance and Sexual Activity  . Alcohol use: No  . Drug use: No  . Sexual activity: Not on file  Lifestyle  . Physical activity:    Days per week: Not on file    Minutes per session: Not on file  . Stress: Not on file  Relationships  . Social connections:    Talks on phone: Not on file    Gets  together: Not on file    Attends religious service: Not on file    Active member of club or organization: Not on file    Attends meetings of clubs or organizations: Not on file    Relationship status: Not on file  . Intimate partner violence:    Fear of current or ex partner: Not on file    Emotionally abused: Not on file    Physically abused: Not on file    Forced sexual activity: Not on file  Other Topics Concern  . Not on file  Social History Narrative   Patient is married to [name]), has 3 children    Patient is right handed   Education is AA degree   Caffeine consumption is 1 cup every now and then a coke    Family History  Problem Relation Age of Onset  . Alzheimer's disease Father   . Dementia Father   . Allergies Brother   . Heart disease Sister   . Stroke Sister   . Clotting disorder Sister     Past Medical History:  Diagnosis Date  . B12 deficiency   . Cognitive decline   . GERD (gastroesophageal reflux disease)   . Hemorrhoids   . Hyperlipidemia   . Interstitial lung disease (HCC)   . Memory loss   . 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 Osteoarthritis   . Parkinson disease (HCC)   . Pleural thickening   . Restless legs syndrome (RLS)     Past Surgical History:  Procedure Laterality Date  . CATARACT EXTRACTION  03/2008  . COLONOSCOPY    . EXAMINATION UNDER ANESTHESIA  08/27/2012   Procedure:  EXAM UNDER ANESTHESIA;  Surgeon: Clovis Pu. Cornett, MD;  Location:  SURGERY CENTER;  Service: General;  Laterality: N/A;  . HEMORRHOID SURGERY  06/21/2012   Procedure: HEMORRHOIDECTOMY;  Surgeon: Clovis Pu. Cornett, MD;  Location:  SURGERY CENTER;  Service: General;  Laterality: N/A;  . LUNG DECORTICATION  1962    Current Outpatient Medications  Medication Sig Dispense Refill  . acetaminophen (TYLENOL) 325 MG tablet Take 650 mg by mouth as needed.      Marland Kitchen aspirin 81 MG tablet Take 81 mg by mouth daily.       . carbidopa-levodopa (SINEMET IR) 25-100 MG tablet Take 1 tablet by mouth 3 (three) times daily. 90 tablet 11  . ergocalciferol (VITAMIN D2) 50000 UNITS capsule Take 50,000 Units by mouth once a week.      . fluticasone (FLONASE) 50 MCG/ACT nasal spray 2 sprays by Nasal route daily.      Marland Kitchen guaiFENesin (MUCINEX) 600 MG 12 hr tablet Take 600 mg by mouth as needed.     . memantine (NAMENDA) 10 MG tablet Take 1 tablet (10 mg total) by mouth 2 (two) times daily. 60 tablet 11  . OVER THE COUNTER MEDICATION Probiotic  One daily    . pantoprazole (PROTONIX) 40 MG tablet Take 40 mg by mouth daily.    . polyethylene glycol (MIRALAX / GLYCOLAX) packet Take 17 g by mouth daily.    . QUEtiapine (SEROQUEL) 25 MG tablet Take one pill in the afternoon. 30 tablet 12  . QUEtiapine (SEROQUEL) 50 MG tablet Take 3 tablets (150 mg total) by mouth at bedtime. 270 tablet 11  . rOPINIRole (REQUIP) 1 MG tablet TAKE 1 & 1/2 TABLETS AT BEDTIME. 45 tablet 9  . salmeterol (SEREVENT DISKUS) 50 MCG/DOSE diskus inhaler INHALE 1 PUFF INTO THE LUNGS TWICE A DAY. 1 Inhaler 2  . saw palmetto 80 MG capsule Take 80 mg by mouth daily.    Marland Kitchen UNABLE TO FIND Osteo Bi-flex - as needed for joint pain    . vitamin B-12 (CYANOCOBALAMIN) 1000 MCG tablet Take 1,000 mcg by mouth daily.    . Vortioxetine HBr (TRINTELLIX) 10 MG TABS Take 1 tablet (10 mg total) by mouth every morning. 30 tablet 4  . albuterol (PROAIR HFA) 108 (90 Base) MCG/ACT inhaler Inhale 2 puffs into the lungs every 6 (six) hours as needed for wheezing or shortness of breath. (Patient not taking: Reported on 06/24/2018) 1 Inhaler 5  . Polyethylene Glycol 3350 GRAN by Does not apply route.     No current facility-administered medications for this visit.     Allergies as of 06/24/2018 - Review Complete 06/24/2018  Allergen Reaction Noted  . Advair diskus [fluticasone-salmeterol]  05/16/2012  . Aloe Swelling 07/22/2012  . Ambien [zolpidem tartrate] Other (See Comments)  05/01/2012  . Halcion [triazolam]  08/03/2014  . Horizant [gabapentin]  08/03/2014  . Lyrica [pregabalin] Other (See Comments) 05/01/2012  . Sulfonamide derivatives    . Ciprofloxacin Rash     Vitals: BP 129/73 (BP Location: Right Arm, Patient Position: Sitting)   Pulse (!) 57   Ht 5\' 6"  (1.676 m)   Wt 138 lb (62.6 kg)   BMI 22.27 kg/m  Last Weight:  Wt Readings from Last 1 Encounters:  06/24/18 138 lb (62.6 kg)   Last Height:   Ht Readings from Last 1 Encounters:  06/24/18 5\' 6"  (1.676 m)    Coordination: intermittent rest tremor noted in right hand (including with distraction) improved with Sinemet,  mild postural tremor L>RUE, mild bradykinesia with finger tapping bilateral, normal foot taps bilat.  Gait:  stands without assistance, upright, very slight shuffling of steps, normal arm swing, negative Romberg, negative pull test  Tone:  Increased on the right with facilitation, normal on the left    Assessment/Plan: ATIF CHAPPLE is a 84 y.o. male here as a follow up. He has Parkinson's with good response to Sinemet, dementia, RLS, B12 deficiency.He was having confusional episodes at night. Klonopin and melationin worsened symptoms, Nuplazid did not work, Seroquel helping quite a bit. He was diagnosed with very mild sleep apnea There was no significant oxygen desaturation only 8.7 minutes of the whole night were spent with desaturated settings. Periodic limb movements were frequent and arousals from periodic limb movements were 5 per hour. The majority of arousals was from spontaneous arousals. His requip was decreased at night to see if this was causing the confusion however he did not do well and it had to be increased again. A 3-day VEEG did not show seizre activity. He is doing well well currently.  Confusion at night likely due to his Parkinson's disease and Parkinson's disease dementia. Dr. Vickey Huger started Seroquel which I think is a good choice,  Continue  Sinemet at current dose. He was started on SSRI for his depression which is helping with mood. He sleeps during the day so wanders at night. Advised to keep him up, maybe get help as well as adult day center. Will send to [place]   Clonazepam has caused side effects in the past, trued northeria  Doing well on Exelon patch, Continue Namenda  He sees Dr. Donell Beers, Geriatric Psychiatrist  Provided literature on PD groups, exercise classes  Will send to Social Worker at [facility] who specializes in PD  They had bayada comes in as well as a Child psychotherapist, a Adult nurse. They had home safety inspection. They have grab bars.   Discussed HCPOA and POA  Continue Sinemet.  Naomie Dean, MD  [facility] [address] [place], [place] [address]  Phone [phone] Fax [phone]  A total of 25 minutes was spent face-to-face with this patient and his family. Over half this time was spent on counseling patient on the  1. PD (Parkinson's disease) (HCC)   2. Psychosis, unspecified psychosis type (HCC)   3. Non-rapid eye movement sleep arousal disorder, sleep walking type   4. Dementia associated with other underlying disease with behavioral disturbance    diagnosis and different diagnostic and therapeutic options available.

## 2018-06-24 NOTE — Telephone Encounter (Signed)
Received referral to meet with patient's wife to talk about resources for patient.  Contacted her cell phone and was not able to leave a message due to voicemail messages at maximum capacity.  Called the home number and left a message with my direct line to contact me back.

## 2018-06-25 ENCOUNTER — Telehealth: Payer: Self-pay | Admitting: Psychology

## 2018-06-25 NOTE — Telephone Encounter (Signed)
THANK YOU!!!! I can't tell you how much we appreciate you, thank you so much!

## 2018-06-25 NOTE — Telephone Encounter (Signed)
Tc with patient's caregiver/ wife, Dewayne Hatchnn. She is scheduled to meet with me in my office on 10/3 at 3pm to review resources both for her as a caregiver and for her

## 2018-07-03 ENCOUNTER — Encounter: Payer: Self-pay | Admitting: Psychology

## 2018-07-04 NOTE — Progress Notes (Addendum)
I met with the patient's wife and his son today in the clinic.    We talked about resources for dementia and specifically around behaviors for dementia.  The family shared that they think the patient has Lewy body dementia.  I provided them with resources for respite care, adult day respite programs and also caregiver education and information on dementia that is available locally.  In addition, since the family mentioned Lewy body dementia several times I provided them with a resource booklet on LBD and some on line support/resource information for the caregivers.  We talked a little bit about self-care for the caregiver.  They left with resource information and contact information with a plan that they would contact [facility] to get enrolled into their caregiver support programs and also learn more about they are memory/respite care programming. 

## 2018-07-04 NOTE — Progress Notes (Signed)
Thank you :)

## 2018-08-12 ENCOUNTER — Other Ambulatory Visit: Payer: Self-pay | Admitting: Neurology

## 2018-08-19 ENCOUNTER — Other Ambulatory Visit: Payer: Self-pay | Admitting: Neurology

## 2018-09-02 ENCOUNTER — Other Ambulatory Visit: Payer: Self-pay | Admitting: Neurology

## 2018-09-02 DIAGNOSIS — F513 Sleepwalking [somnambulism]: Secondary | ICD-10-CM

## 2018-09-02 DIAGNOSIS — F0281 Dementia in other diseases classified elsewhere with behavioral disturbance: Secondary | ICD-10-CM

## 2018-09-02 DIAGNOSIS — F02818 Dementia in other diseases classified elsewhere, unspecified severity, with other behavioral disturbance: Secondary | ICD-10-CM

## 2018-09-02 DIAGNOSIS — F29 Unspecified psychosis not due to a substance or known physiological condition: Secondary | ICD-10-CM

## 2018-09-09 ENCOUNTER — Other Ambulatory Visit: Payer: Self-pay | Admitting: Neurology

## 2018-09-09 DIAGNOSIS — F0281 Dementia in other diseases classified elsewhere with behavioral disturbance: Secondary | ICD-10-CM

## 2018-09-09 DIAGNOSIS — F29 Unspecified psychosis not due to a substance or known physiological condition: Secondary | ICD-10-CM

## 2018-09-09 DIAGNOSIS — F513 Sleepwalking [somnambulism]: Secondary | ICD-10-CM

## 2018-09-09 DIAGNOSIS — F02818 Dementia in other diseases classified elsewhere, unspecified severity, with other behavioral disturbance: Secondary | ICD-10-CM

## 2018-09-23 ENCOUNTER — Encounter: Payer: Self-pay | Admitting: Podiatry

## 2018-09-23 ENCOUNTER — Ambulatory Visit: Payer: Medicare Other | Admitting: Podiatry

## 2018-09-23 DIAGNOSIS — M79675 Pain in left toe(s): Secondary | ICD-10-CM | POA: Diagnosis not present

## 2018-09-23 DIAGNOSIS — B351 Tinea unguium: Secondary | ICD-10-CM | POA: Diagnosis not present

## 2018-09-23 DIAGNOSIS — M79674 Pain in right toe(s): Secondary | ICD-10-CM

## 2018-09-23 NOTE — Patient Instructions (Signed)

## 2018-09-24 NOTE — Progress Notes (Signed)
Subjective:   Patient ID: Wesley Pierce, male   DOB: 84 y.o.   MRN: [id number]   HPI Patient presents for nail trimming stating the right hallux had partially come off and he was concerned at all nails are thick and he cannot cut and they become painful   ROS      Objective:  Physical Exam  Neurovascular status intact with thick yellow brittle nailbeds 1-5 both feet with moderate looseness of the right hallux nail     Assessment:  Mycotic nail infection with pain 1-5 both feet with moderate looseness of the right hallux nail     Plan:  Debride painful nailbeds 1-5 both feet with no iatrogenic bleeding and reappoint for routine care

## 2018-09-30 ENCOUNTER — Other Ambulatory Visit: Payer: Self-pay | Admitting: Neurology

## 2018-10-03 ENCOUNTER — Other Ambulatory Visit: Payer: Self-pay | Admitting: Emergency Medicine

## 2018-10-03 ENCOUNTER — Other Ambulatory Visit: Payer: Self-pay | Admitting: Neurology

## 2018-10-04 ENCOUNTER — Other Ambulatory Visit: Payer: Self-pay | Admitting: Neurology

## 2018-11-05 ENCOUNTER — Telehealth: Payer: Self-pay | Admitting: Emergency Medicine

## 2018-11-05 ENCOUNTER — Other Ambulatory Visit: Payer: Self-pay | Admitting: Emergency Medicine

## 2018-11-05 MED ORDER — SALMETEROL XINAFOATE 50 MCG/DOSE IN AEPB: 1.0000 | INHALATION_SPRAY | Freq: Every day | RESPIRATORY_TRACT | 0 refills | Status: DC

## 2018-11-05 NOTE — Telephone Encounter (Signed)
Called and spoke with patients wife, refill has been sent in. Nothing further needed.  

## 2018-11-07 ENCOUNTER — Telehealth: Payer: Self-pay | Admitting: Emergency Medicine

## 2018-11-07 MED ORDER — SALMETEROL XINAFOATE 50 MCG/DOSE IN AEPB: 1.0000 | INHALATION_SPRAY | Freq: Two times a day (BID) | RESPIRATORY_TRACT | 12 refills | Status: AC

## 2018-11-07 NOTE — Telephone Encounter (Signed)
Called  And spoke with pharmacy the prescription for Serevent had been sent in wrong I have now fixed this nothing further needed.

## 2018-11-07 NOTE — Telephone Encounter (Signed)
[facility] called needing clarification for prescription just put in for patients inhaler

## 2018-11-26 ENCOUNTER — Encounter: Payer: Self-pay | Admitting: Emergency Medicine

## 2018-11-26 ENCOUNTER — Ambulatory Visit: Payer: Medicare Other | Admitting: Emergency Medicine

## 2018-11-26 DIAGNOSIS — G20 Parkinson's disease: Secondary | ICD-10-CM | POA: Diagnosis not present

## 2018-11-26 DIAGNOSIS — F0281 Dementia in other diseases classified elsewhere with behavioral disturbance: Secondary | ICD-10-CM

## 2018-11-26 DIAGNOSIS — G2581 Restless legs syndrome: Secondary | ICD-10-CM

## 2018-11-26 DIAGNOSIS — J439 Emphysema, unspecified: Secondary | ICD-10-CM | POA: Diagnosis not present

## 2018-11-26 NOTE — Assessment & Plan Note (Signed)
With history of intermittent aspiration on prior swallowing evaluation  Continue to use a straw for liquids and practice your swallowing precautions to avoid choking or aspiration

## 2018-11-26 NOTE — Progress Notes (Signed)
   Subjective:    Patient ID: Wesley Pierce, male    DOB: 1931-07-26, 85 y.o.   MRN: [id number]  HPI PMH:  COPD, some chronic right basilar scar and restless leg syndrome. hx of a remote L decortication, ? Due to empyema. BOOP.  Vitamin B12 deficiency, early Parkinson's disease with some associated dysphasia   ROV 11/21/17 --this is a follow-up visit.  85 year old male with a history of Parkinson's, COPD, restless leg syndrome and a left decortication for an empyema.  He reports that he has been doing fairly well, wife agrees. He has had some cough lately, ? URI. Has been on mucinex x 1 month - has helped him some. He remains on serevent bid. Never has used albuterol. Needs a refill for expired HFA. No hospitalizations, no pred, no abx. Flu shot up to date. Pneumonia shots UTD. No dysphagia. Uses a straw per SLP recs. He uses flonase qd - interested in possibly stopping it  ROV 11/26/2018 --Wesley Pierce is 85, has a history of Parkinson's disease with some associated dysphasia, COPD, restless leg syndrome, remote left decortication for empyema, allergic rhinitis.  He has been managed on Serevent, has albuterol available and uses approximately.  He also is on fluticasone nasal spray, pantoprazole, Requip 1.5 mg nightly. He uses a straw with liquids as per SLP recs. Only rarely has any evidence aspiration. Denies any dyspnea. He has sleeping difficulty - "can't settle down". Gets up very early in the am. He also does some wandering during the pm. Flu shot and PNA shots up to date.     Review of Systems As per HPI     Objective:   Physical Exam Vitals:   11/26/18 1429  BP: 126/72  Pulse: 63  SpO2: 98%  Weight: 131 lb (59.4 kg)  Height: 5\' 6"  (1.676 m)   Gen: Pleasant, well-nourished, in no distress  ENT: No lesions,  mouth clear,  oropharynx clear, no postnasal drip  Neck: No JVD, no stridor  Lungs: No use of accessory muscles, no wheezes or crackles  Cardiovascular: RRR, heart  sounds normal, no murmur or gallops, no peripheral edema  Musculoskeletal: No deformities, no cyanosis or clubbing  Neuro: alert, non focal, poorly oriented. Unable to answer simple questions about his status.   Skin: Warm, no lesions or rashes      Assessment & Plan:  COPD (chronic obstructive pulmonary disease) We will continue Serevent twice a day Keep albuterol available use 2 puffs if needed for shortness of breath  RLS (restless legs syndrome) Continue Requip each evening as you are taking it. Follow with Dr. Delton Pierce in 12 months or sooner if you have any problems.   Dementia due to Parkinson's disease with behavioral disturbance (HCC) With history of intermittent aspiration on prior swallowing evaluation  Continue to use a straw for liquids and practice your swallowing precautions to avoid choking or aspiration  Levy Pupa, MD, PhD 11/26/2018, 2:46 PM [facility] [phone] or if no answer [phone]

## 2018-11-26 NOTE — Assessment & Plan Note (Signed)
Continue Requip each evening as you are taking it. Follow with Dr. Delton Coombes in 12 months or sooner if you have any problems.

## 2018-11-26 NOTE — Patient Instructions (Addendum)
We will continue Serevent twice a day Keep albuterol available use 2 puffs if needed for shortness of breath Continue to use a straw for liquids and practice your swallowing precautions to avoid choking or aspiration Continue Requip each evening as you are taking it. Follow with Dr. Delton Coombes in 12 months or sooner if you have any problems.

## 2018-11-26 NOTE — Assessment & Plan Note (Signed)
We will continue Serevent twice a day Keep albuterol available use 2 puffs if needed for shortness of breath

## 2019-06-29 ENCOUNTER — Other Ambulatory Visit: Payer: Self-pay | Admitting: Neurology

## 2019-06-30 ENCOUNTER — Ambulatory Visit: Payer: Medicare Other | Admitting: Adult Health

## 2019-06-30 ENCOUNTER — Ambulatory Visit: Payer: Medicare Other | Admitting: Family Medicine

## 2019-06-30 ENCOUNTER — Other Ambulatory Visit: Payer: Self-pay

## 2019-06-30 MED ORDER — QUETIAPINE FUMARATE 50 MG PO TABS: 150.0000 mg | ORAL_TABLET | Freq: Every day | ORAL | 0 refills | Status: DC

## 2019-07-01 ENCOUNTER — Other Ambulatory Visit: Payer: Self-pay | Admitting: Neurology

## 2019-07-01 NOTE — Progress Notes (Addendum)
PATIENT: Wesley Pierce DOB: 01/08/1931  REASON FOR VISIT: follow up HISTORY FROM: patient  Chief Complaint  Patient presents with   Follow-up    Yearly f/u. Wife present. Rm 1. No new concerns at this time.      HISTORY OF PRESENT ILLNESS: Today 07/02/19 Wesley Pierce is a 85 y.o. male here today for follow up for PD. He continues Sinemet TID. Namenda 42m twice daily. Seroquel 289min the evenings and 150100mt bedtime which helps with sundowning and sleep. Trintellix helps with mood (written by PCP). He saw a geriatric psychiatrist but his wife did not feel that this was a good fit and did not take him back. Dr. ReqCrista Curblps with REM sleep disorder and RLS. He seems to tolerate medications well. Family met with JesMyra Gianottiocial worker, who helped to provide [employer] and AnnLelon Frohliche living with their son and daughter-in-law. They are very helpful with his care. He does not drive. He needs assistance with all ADL's. He does not use assistive devices for walking. No falls or changes in gait. He eats well but has lost some weight (about 13 pounds in 1 year). He is drinking fluids with a straw and is doing well. He takes day and evening medications without difficulty but does become a little more irritable and refuses to take medications at night on occasion. He can not drive. He does read the paper daily and his wife tries to orient him to the date every day.   HISTORY: (copied from Dr AheCathren Lainete on 06/24/2018)  Interval history 06/24/2018: Things are "kind of rough". He continues to have difficulty with gait and memory loss. Frustration and depression at home. He is getting up multiple times in the evening, he wanders around the house. He naps during the day which is likely affecting his sleeping.  He has falls at night. Discussed using walking aids and trying to keep lights on at night. Discussed help in the home because they say when he is awake he "wears" them  down - he is energized and this is difficult for family to handle. Discussed an Adult day center. Will refer to Soc[employer] officer, [employer]. He has hallucinations, but improved since Seroquel. He coughs after drinking. He has had swallow evals in the past, they are using a straw he did not like the thickened, but no aspiration or pneumonias.   Interval history 07/31/2017: Son and wife here. He is having more problems with aphasia, the wrong word coming out. The last few weeks have been more pronounced, he can tell people about a table but can't remember the word "table" or calls it a door. He is eating a normal diet, not thickening fluids, he feels fine now not choking. More progressed over the last few days, unclear if confused. He has been on mucinex for ocngestion in the chest and sinuses. He is drinking enough fluids. Urine is light yellow. Still has difficulty sleeping. He gets into bed but then gets out. He can go to bed in and out 40 times. Will try Clonazepam at bedtime for RLS and REM sleep disorder.    Interval history 07/31/2016:Wesley Pierce is a [age]o. male here as a follow up. He has Parkinson's disease dementia with good response to Sinemet and seroquel, dementia, RLS, B12 deficiency. The Seroquel has helped. Fewer nightmares. Feels memory continues to worseni. He talks about not driving again. Seroquel is a "life saver" Dr. AroReynaldo Minium  has increased it. He was diagnosed with pneumonia, he has had aspiration and was evaluated by speech and swallow. He has a difficult time getting the words out. He can eat anything he wants, he is using a straw for liquids, not thickened liquids anymore, he is coughing a little. He was a SNF for 5 weeks. He is home now and doing well. Not wandering at night. Sleeping is going well.    Interval history 01/18/2016:Wesley Pierce is a 85 y.o. male here as a follow up. He has Parkinson's disease dementia with good response to Sinemet and  seroquel, dementia, RLS, B12 deficiency. The Seroquel has helped. Fewer nightmares. Wandering at night is not as bad. He feels like he should be driving. Discussed again, family has taken away keys and also agrees he should not be driving. He is slowly regressing at night. He has started wandering more though. Not taking naps. He goes to bed at 10pm and wakes randomly and wanders. He had nausea with the rivastigmine when tried to increase above 1.104m bid. Can't increase oral increase. No dysphagia. No falls. Sinemet helps with his tremor. Memory is stable. He tried Nuplazid but symptoms worsened. He is back on requip at night for his RLS, stopping it did not improve his confusional episodes at night and only worsened his RLS symptoms and periodic limb movements of sleep. He has had a sleep study and prolonged EEG as well. I am glad the seroquel has been helpful, this can be further titrated if needed. Have discussed the side effects and black box warnings in the elderly for risk of increased mortality. Consider Namenda at a later time.   Interval history 09/13/2015: Wesley HJORTis a 85y.o. male here as a follow up. He has Parkinson's with good response to Sinemet, dementia, RLS, B12 deficiency. The Seroquel has helped. Fewer nightmares. Wandering at night is not as bad. He is upset his license has been taken away. He wants to drive. It is a hardship on the family. He has wandered outside. He was episodes of confusion. The family took away his keys and the keys to the house. There were some times in September he did not know who his son was. This is better. He has had some falls however. No dysphagia. Wife and son provide most of the information. Overall they are thrilled with his improvement. They also Have started an anti-depression medication which has helped. Sinemet helps with his tremor. Memory is stable. He tried Nuplazid but symptoms worsened. He is back on requip at night for his RLS, stopping  it did not improve his confusional episodes at night and only worsened his RLS symptoms and periodic limb movements of sleep. He has had a sleep study and prolonged EEG as well. I am glad the seroquel has been helpful, this can be further titrated if needed. Have discussed the side effects and black box warnings in the elderly for risk of increased mortality. He is on Rivastigmine. Will increase today and consider Namenda at a later time.    Interval history 10/14/102016: Wesley MOROZOVis a 85y.o. male here as a follow up. He has Parkinson's with good response to Sinemet, dementia, RLS, B12 deficiency.He is having confusional episodes at night. Klonopin and melationin worsened symptoms, Nuplazid did not work. He was diagnosed with very mild sleep study. A 3-day VEEG did not show seizre activity. There was no significant oxygen desaturation only 8.7 minutes of the whole night were spent with  desaturated settings. Periodic limb movements were frequent and arousals from periodic limb movements were 5 per hour. The majority of arousals was from spontaneous arousals. His requip was decreased at night to see if this was causing the confusion however he did not do well and it had to be increased again. He destroyed a lampshade in the den this past week. He has confusional episodes, sleepy during the day.   Had a long talk with patient, his wife and son who was with him. He is confusional episodes are likely due to Parkinson's disease and Parkinson's disease dementia. Dr. Zacarias Pontes started Seroquel at night and I did encourage them to try it. I tried to refer them to Dr. Artist Pais was a geriatric psychiatrist however he did not take their insurance, recommended possibly seeing Dr. Rollene Rotunda who is also a geriatric psychiatrist. I do feel that Seroquel is a good choice and this can be titrated as well.  Interval history 05/24/2015 : Wesley Pierce is a 85 y.o. male here as a follow up. He has Parkinson's  with good response to Sinemet, dementia, RLS, B12 deficiency. He continues to sleep walk and recently had a sleep study. He is here to discuss his sleep study. However he does has a follow up with Dr. Brett Fairy on the 1st of September. I have explained to him that following up with Dr. Brett Fairy for sleep is appropriate but I can give him an initial review. He continues to sleep walk. The other night he urinated on his couch in the middle of the night. Results showed that he was talking to himself and pulling out his lines during wakefullness, there was no REM sleep to evaluate for REM sleep disorder, EEG montage did not show seizures. Sleep study recommendation was to decrease any medications that could cause confusion, he only takes neurontin at night. Can trial stopping that but doubtful this is the cause. May consider an ambulatory eeg.   Interval history 03/04/2015:He continues to significant persistent episodes of sleep walking and confusion at night. Last night they went to bed at 10pm. He woke up last night at 1:30am. He was standing at the sink. He said he was helping people build a pond. He talks back to his wife but he does not remember these episodes. He urinated on the side of the bed. She got him back to bed and he got up again due to RLS, he doesn't remember all of it. He went to the refrigerator and she followed him down. He didn' t know where the fridge was. He remembers getting water but doesn't know why he was so confused. He tosses and tumbles, he doesn't usually remembr the episodes at night. He does around 2 or 4am not knowing which one was the hot or cold spicket. He can't find the door knob at night. He remembers some episodes but others he does not. The wife has gotten him out of the closet many times when he gets lost at night. He can't get out of the closet.   Thought possibly this was REM sleep disorder but clonazepam made it worse. He sas tried melatonin which also made things worse.  The melatonin and clonazepam have made things worse. But these episodes started even before he tried medication. Unclear if this is a medication effect or not. He took [place] and destroyed the lamps one night.   Interval update 11/03/2014:  Patient is sleepwalking. He wakes up and moves. For example, he told his wife he  was looking for a lantern. A lot of times he remembers sometimes he doesnt. Usually wife will ask a question and he can give her an answer. Sometimes there is a little agitation, may hit the bed. Agitation is rare, mostly confusional wandering. Last night he was in the kitchen looking for water.   His feet are hurting. The bottoms are tingly. He has to stand up and walk around. He tries to sit down and read which helps some. The bottom of his right foot feels sore. It has been going on for 5 years.   Initial visit 08/03/2014: Wesley Pierce is a 85 y.o. male here as a referral from Dr. Reynaldo Minium for Parkinson's disease, RLS and memory changes. He is 85 years old and is a former patient of Dr. Janann Colonel and is transitioning to me. He was found to have B12 deficiency and started on B12 injections. At last appointment was starte don low-dose Sinemet and titrated to one pill three times daily.  Wife provides most history and updates. Things are gong very well. He is a "whole different person" when he is on the medicine. Can see a significant change and can tell when it wears off. They are taking the medications at 8:30am and is taking it 3x a day a whole pill. Tremor is improving and helps significantly. Still taking the injections. He has some sleeping problems. He takes Ropinerole and gabapentin at night. Usually he sleeps well when he gets to sleep. But he has some difficulty initiating sleep.   Currently taking Neurontin and Requip at bedtime for possible RLS. Is now currently taking Requip 1.68m and Gabapentin 309mnightly. Tried Gabapentin 60045mnd had hallucinations. Had tried  higher dose of Requip but also suffered from hallucinations.   Has had a few TIAs in the past. No strokes. Otherwise healthy.   No family history of parkinson's or other neurodegenerative disorders   Basic lab work, including TSH, reviewed from PCP and was unremarkable.   Review of Systems: Patient complains of symptoms per HPI as well as the following symptoms: no CP, no SOB. Pertinent negatives per HPI. All others negative.  REVIEW OF SYSTEMS: Out of a complete 14 system review of symptoms, the patient complains only of the following symptoms, memory loss, speech difficulty and all other reviewed systems are negative.  ALLERGIES: Allergies  Allergen Reactions   Advair Diskus [Fluticasone-Salmeterol]     anxiety   Aloe Swelling   Ambien [Zolpidem Tartrate] Other (See Comments)    Sleep walking   Halcion [Triazolam]     Sleep Walking   Horizant [Gabapentin]     Sleep walking and halucinations   Lyrica [Pregabalin] Other (See Comments)    Sleep walking    Sulfonamide Derivatives     Pt unable to remember   Ciprofloxacin Rash    HOME MEDICATIONS: Outpatient Medications Prior to Visit  Medication Sig Dispense Refill   acetaminophen (TYLENOL) 325 MG tablet Take 650 mg by mouth as needed.       albuterol (PROAIR HFA) 108 (90 Base) MCG/ACT inhaler Inhale 2 puffs into the lungs every 6 (six) hours as needed for wheezing or shortness of breath. 1 Inhaler 5   aspirin 81 MG tablet Take 81 mg by mouth daily.       carbidopa-levodopa (SINEMET IR) 25-100 MG tablet TAKE 1 TABLET BY MOUTH 3 TIMES A DAY. 90 tablet 11   ergocalciferol (VITAMIN D2) 50000 UNITS capsule Take 50,000 Units by mouth once a week.  fluticasone (FLONASE) 50 MCG/ACT nasal spray 2 sprays by Nasal route daily.       guaiFENesin (MUCINEX) 600 MG 12 hr tablet Take 600 mg by mouth as needed.      memantine (NAMENDA) 10 MG tablet Take 1 tablet (10 mg total) by mouth 2 (two) times daily. 60 tablet  11   OVER THE COUNTER MEDICATION Probiotic  One daily     pantoprazole (PROTONIX) 40 MG tablet Take 40 mg by mouth daily.     polyethylene glycol (MIRALAX / GLYCOLAX) packet Take 17 g by mouth daily.     QUEtiapine (SEROQUEL) 25 MG tablet Take one pill in the afternoon. 30 tablet 12   QUEtiapine (SEROQUEL) 50 MG tablet Take 3 tablets (150 mg total) by mouth at bedtime. 270 tablet 0   rOPINIRole (REQUIP) 1 MG tablet TAKE 1 & 1/2 TABLETS AT BEDTIME. 45 tablet 9   salmeterol (SEREVENT DISKUS) 50 MCG/DOSE diskus inhaler Inhale 1 puff into the lungs 2 (two) times daily. 1 Inhaler 12   UNABLE TO FIND Osteo Bi-flex - as needed for joint pain     vitamin B-12 (CYANOCOBALAMIN) 1000 MCG tablet Take 1,000 mcg by mouth daily.     Vortioxetine HBr (TRINTELLIX) 10 MG TABS Take 1 tablet (10 mg total) by mouth every morning. 30 tablet 4   saw palmetto 80 MG capsule Take 80 mg by mouth daily.     Polyethylene Glycol 3350 GRAN by Does not apply route.     No facility-administered medications prior to visit.     PAST MEDICAL HISTORY: Past Medical History:  Diagnosis Date   B12 deficiency    Cognitive decline    GERD (gastroesophageal reflux disease)    Hemorrhoids    Hyperlipidemia    Interstitial lung disease ([place])    Memory loss    Non-rapid eye movement sleep arousal disorder, sleep walking type 04/12/2015   Osteoarthritis    Parkinson disease ([facility])    Pleural thickening    Restless legs syndrome (RLS)     PAST SURGICAL HISTORY: Past Surgical History:  Procedure Laterality Date   CATARACT EXTRACTION  03/2008   COLONOSCOPY     EXAMINATION UNDER ANESTHESIA  08/27/2012   Procedure: EXAM UNDER ANESTHESIA;  Surgeon: Marcello Moores A. Cornett, MD;  Location: [facility];  Service: General;  Laterality: N/A;   HEMORRHOID SURGERY  06/21/2012   Procedure: HEMORRHOIDECTOMY;  Surgeon: Joyice Faster. Cornett, MD;  Location: [facility];  Service: General;   Laterality: N/A;   LUNG DECORTICATION  1962    FAMILY HISTORY: Family History  Problem Relation Age of Onset   Alzheimer's disease Father    Dementia Father    Allergies Brother    Heart disease Sister    Stroke Sister    Clotting disorder Sister     SOCIAL HISTORY: Social History   Socioeconomic History   Marital status: Married    Spouse name: Ann   Number of children: 3   Years of education: AA degree   Highest education level: Not on file  Occupational History   Occupation: retired from Programme researcher, broadcasting/film/video strain: Not on file   Food insecurity    Worry: Not on file    Inability: Not on file   Transportation needs    Medical: Not on file    Non-medical: Not on file  Tobacco Use   Smoking status: Never Smoker   Smokeless tobacco: Never Used  Substance  and Sexual Activity   Alcohol use: No   Drug use: No   Sexual activity: Not on file  Lifestyle   Physical activity    Days per week: Not on file    Minutes per session: Not on file   Stress: Not on file  Relationships   Social connections    Talks on phone: Not on file    Gets together: Not on file    Attends religious service: Not on file    Active member of club or organization: Not on file    Attends meetings of clubs or organizations: Not on file    Relationship status: Not on file   Intimate partner violence    Fear of current or ex partner: Not on file    Emotionally abused: Not on file    Physically abused: Not on file    Forced sexual activity: Not on file  Other Topics Concern   Not on file  Social History Narrative   Patient is married to [name]), has 3 children    Patient is right handed   Education is AA degree   Caffeine consumption is 1 cup every now and then a coke      PHYSICAL EXAM  Vitals:   07/02/19 0941 07/02/19 1035  BP: (!) 174/74 138/70  Pulse: (!) 55   Temp: (!) 96.9 F (36.1 C)   TempSrc: Oral   Weight: 125 lb 3.2 oz  (56.8 kg)   Height: '5\' 6"'  (1.676 m)    Body mass index is 20.21 kg/m.  Generalized: Well developed, in no acute distress  Neurological examination  Mentation: Alert, not oriented to place (with exception of city) or time, oriented to self, speech fluent but not related to conversation  Cranial nerve II-XII: Pupils were equal round reactive to light. Unable to fully assess due to patient not following instructions  Motor: The motor testing reveals 4 over 5 strength of all 4 extremities. Good symmetric motor tone is noted throughout.  Sensory: Sensory testing is intact to soft touch on all 4 extremities. No evidence of extinction is noted.  Coordination: Cerebellar testing reveals slow finger-nose-finger, heel-to-shin not performed as pt not following instructions  Gait and station: Gait is short but stable, Tandem not attempted   DIAGNOSTIC DATA (LABS, IMAGING, TESTING) - I reviewed patient records, labs, notes, testing and imaging myself where available.  MMSE - Mini Mental State Exam 07/02/2019 06/24/2018  Not completed: (No Data) -  Orientation to time 0 1  Orientation to Place 2 2  Registration 1 3  Attention/ Calculation 0 1  Recall 0 0  Recall-comments - no attempt  Language- name 2 objects 1 0  Language- repeat 0 0  Language- follow 3 step command 0 3  Language- read & follow direction 1 1  Write a sentence 0 0  Copy design 0 0  Total score 5 11     Lab Results  Component Value Date   WBC 7.3 03/03/2016   HGB 13.2 03/03/2016   HCT 37.9 (L) 03/03/2016   MCV 85.2 03/03/2016   PLT 87 (L) 03/03/2016      Component Value Date/Time   NA 133 (L) 03/03/2016 0822   NA 137 07/14/2015 1250   K 2.9 (L) 03/03/2016 0822   CL 99 (L) 03/03/2016 0822   CO2 26 03/03/2016 0822   GLUCOSE 100 (H) 03/03/2016 0822   BUN 14 03/03/2016 0822   BUN 11 07/14/2015 1250   CREATININE 0.60 (L)  03/03/2016 0822   CALCIUM 7.8 (L) 03/03/2016 0822   PROT 6.4 (L) 02/28/2016 2154   PROT 6.4  07/14/2015 1250   ALBUMIN 3.9 02/28/2016 2154   ALBUMIN 4.1 07/14/2015 1250   AST 38 02/28/2016 2154   ALT 21 02/28/2016 2154   ALKPHOS 69 02/28/2016 2154   BILITOT 1.1 02/28/2016 2154   BILITOT 0.4 07/14/2015 1250   GFRNONAA >60 03/03/2016 0822   GFRAA >60 03/03/2016 0822   No results found for: CHOL, HDL, LDLCALC, LDLDIRECT, TRIG, CHOLHDL Lab Results  Component Value Date   HGBA1C 5.8 (H) 11/03/2014   Lab Results  Component Value Date   VITAMINB12 1,116 (H) 11/03/2014   Lab Results  Component Value Date   TSH 1.080 11/03/2014    ASSESSMENT AND PLAN 85 y.o. year old male  has a past medical history of B12 deficiency, Cognitive decline, GERD (gastroesophageal reflux disease), Hemorrhoids, Hyperlipidemia, Interstitial lung disease ([place]), Memory loss, Non-rapid eye movement sleep arousal disorder, sleep walking type (04/12/2015), Osteoarthritis, Parkinson disease ([place]), Pleural thickening, and Restless legs syndrome (RLS). here with     ICD-10-CM   1. PD (Parkinson's disease) ([place])  G20   2. Dementia due to Parkinson's disease with behavioral disturbance ([place])  G20    F02.81   3. Hereditary and idiopathic peripheral neuropathy  G60.9   4. REM sleep behavior disorder  G47.52   5. RLS (restless legs syndrome)  G25.81     Overall, Wesley Pierce is stable today.  We will continue Sinemet 3 times daily as prescribed.  He will also continue Requip for restless legs and REM sleep disorder.  We will continue Seroquel 25 mg in the evening and 150 mg at night.  He is tolerating this medication well and his wife feels that it works.  There have been no falls or side effects noted.  I have educated her on potential side effects of Seroquel.  She will monitor closely.  She does not wish to see geriatric psychiatry at this point.  Should we need to make additional changes to medications in the future we will reconsider.  He will continue close follow-up with primary care as well for management of mood  and other chronic disease processes.  Blood pressure initially elevated in the office today, however, repeat manual reading 138/70.  Mrs. Linden was advised to monitor this at home.  She will monitor his diet as well.  I have advised she consider adding a protein drink to his diet daily.  She will try adding medications to applesauce or yogurt in the evenings.  She will notify us if he continues to have difficulty taking medications at night.  He will follow-up in 6 months, sooner if needed.  His wife verbalizes understanding and agreement with this plan.   No orders of the defined types were placed in this encounter.    No orders of the defined types were placed in this encounter.     I spent 45 minutes with the patient. 50% of this time was spent counseling and educating patient on plan of care and medications.    Debbora Presto, FNP-C 07/02/2019, 12:43 PM [facility] [address] [place], [place] [address] [phone]  Made any corrections needed, and agree with history, physical, neuro exam,assessment and plan as stated.     Sarina Ill, MD [facility]

## 2019-07-01 NOTE — Patient Instructions (Addendum)
Continue current treatment plan  Continue close follow up with PCP ( monitor BP closely at home)and psychiatry  Follow up in 6-12 months  Parkinson's Disease Parkinson's disease causes problems with movements. It is a long-term condition. It gets worse over time (is progressive). It affects each person in different ways. It makes it harder for you to:  Control how your body moves.  Move your body normally. The condition can range from mild to very bad (advanced). What are the causes? This condition results from a loss of brain cells called neurons. These brain cells make a chemical called dopamine, which is needed to control body movement. As the condition gets worse, the brain cells make less dopamine. This makes it hard to move or control your movements. The exact cause of this condition is not known. What increases the risk?  Being male.  Being age 49 or older.  Having family members who had Parkinson's disease.  Having had an injury to the brain.  Being very sad (depressed).  Being around things that are harmful or poisonous. What are the signs or symptoms? Symptoms of this condition can vary. The main symptoms have to do with movement. These include:  A tremor or shaking while you are resting that you cannot control.  Stiffness in your neck, arms, and legs.  Slowing of movement. This may include: ? Losing expressions of the face. ? Having trouble making small movements that are needed to button your clothing or brush your teeth.  Walking in a way that is not normal. You may walk with short, shuffling steps.  Loss of balance when standing. You may sway, fall backward, or have trouble making turns. Other symptoms include:  Being very sad, worried, or confused.  Seeing or hearing things that are not real.  Losing thinking abilities (dementia).  Trouble speaking or swallowing.  Having a hard time pooping (constipation).  Needing to pee right away, peeing often,  or not being able to control when you pee or poop.  Sleep problems. How is this treated? There is no cure. The goal of treatment is to manage your symptoms. Treatment may include:  Medicines.  Therapy to help with talking or movement.  Surgery to reduce shaking and other movements that you cannot control. Follow these instructions at home: Medicines  Take over-the-counter and prescription medicines only as told by your doctor.  Avoid taking pain or sleeping medicines. Eating and drinking  Follow instructions from your doctor about what you cannot eat or drink.  Do not drink alcohol. Activity  Talk with your doctor about if it is safe for you to drive.  Do exercises as told by your doctor. Lifestyle      Put in grab bars and railings in your home. These help to prevent falls.  Do not use any products that contain nicotine or tobacco, such as cigarettes, e-cigarettes, and chewing tobacco. If you need help quitting, ask your doctor.  Join a support group. General instructions  Talk with your doctor about what you need help with and what your safety needs are.  Keep all follow-up visits as told by your doctor, including any therapy visits to help with talking or moving. This is important. Contact a doctor if:  Medicines do not help your symptoms.  You feel off-balance.  You fall at home.  You need more help at home.  You have trouble swallowing.  You have a very hard time pooping.  You have a lot of side effects from  your medicines.  You feel very sad, worried, or confused. Get help right away if:  You were hurt in a fall.  You see or hear things that are not real.  You cannot swallow without choking.  You have chest pain or trouble breathing.  You do not feel safe at home.  You have thoughts about hurting yourself or others. If you ever feel like you may hurt yourself or others, or have thoughts about taking your own life, get help right away. You  can go to your nearest emergency department or call:  Your local emergency services (911 in the [place]  A suicide crisis helpline, such as the National Suicide Prevention Lifeline at 586-884-4334. This is open 24 hours a day. Summary  This condition causes problems with movements.  It is a long-term condition. It gets worse over time.  There is no cure. Treatment focuses on managing your symptoms.  Talk with your doctor about what you need help with and what your safety needs are.  Keep all follow-up visits as told by your doctor. This is important. This information is not intended to replace advice given to you by your health care provider. Make sure you discuss any questions you have with your health care provider. Document Released: 12/11/2011 Document Revised: 12/05/2018 Document Reviewed: 12/05/2018 Elsevier Patient Education  2020 Elsevier Inc.   Dementia Caregiver Guide Dementia is a term used to describe a number of symptoms that affect memory and thinking. The most common symptoms include:  Memory loss.  Trouble with language and communication.  Trouble concentrating.  Poor judgment.  Problems with reasoning.  Child-like behavior and language.  Extreme anxiety.  Angry outbursts.  Wandering from home or public places. Dementia usually gets worse slowly over time. In the early stages, people with dementia can stay independent and safe with some help. In later stages, they need help with daily tasks such as dressing, grooming, and using the bathroom. How to help the person with dementia cope Dementia can be frightening and confusing. Here are some tips to help the person with dementia cope with changes caused by the disease. General tips  Keep the person on track with his or her routine.  Try to identify areas where the person may need help.  Be supportive, patient, calm, and encouraging.  Gently remind the person that adjusting to changes takes time.   Help with the tasks that the person has asked for help with.  Keep the person involved in daily tasks and decisions as much as possible.  Encourage conversation, but try not to get frustrated or harried if the person struggles to find words or does not seem to appreciate your help. Communication tips  When the person is talking or seems frustrated, make eye contact and hold the person's hand.  Ask specific questions that need yes or no answers.  Use simple words, short sentences, and a calm voice. Only give one direction at a time.  When offering choices, limit them to just 1 or 2.  Avoid correcting the person in a negative way.  If the person is struggling to find the right words, gently try to help him or her. How to recognize symptoms of stress Symptoms of stress in caregivers include:  Feeling frustrated or angry with the person with dementia.  Denying that the person has dementia or that his or her symptoms will not improve.  Feeling hopeless and unappreciated.  Difficulty sleeping.  Difficulty concentrating.  Feeling anxious, irritable, or depressed.  Developing stress-related health problems.  Feeling like you have too little time for your own life. Follow these instructions at home:   Make sure that you and the person you are caring for: ? Get regular sleep. ? Exercise regularly. ? Eat regular, nutritious meals. ? Drink enough fluid to keep your urine clear or pale yellow. ? Take over-the-counter and prescription medicines only as told by your health care providers. ? Attend all scheduled health care appointments.  Join a support group with others who are caregivers.  Ask about respite care resources so that you can have a regular break from the stress of caregiving.  Look for signs of stress in yourself and in the person you are caring for. If you notice signs of stress, take steps to manage it.  Consider any safety risks and take steps to avoid them.   Organize medications in a pill box for each day of the week.  Create a plan to handle any legal or financial matters. Get legal or financial advice if needed.  Keep a calendar in a central location to remind the person of appointments or other activities. Tips for reducing the risk of injury  Keep floors clear of clutter. Remove rugs, magazine racks, and floor lamps.  Keep hallways well lit, especially at night.  Put a handrail and nonslip mat in the bathtub or shower.  Put childproof locks on cabinets that contain dangerous items, such as medicines, alcohol, guns, toxic cleaning items, sharp tools or utensils, matches, and lighters.  Put the locks in places where the person cannot see or reach them easily. This will help ensure that the person does not wander out of the house and get lost.  Be prepared for emergencies. Keep a list of emergency phone numbers and addresses in a convenient area.  Remove car keys and lock garage doors so that the person does not try to get in the car and drive.  Have the person wear a bracelet that tracks locations and identifies the person as having memory problems. This should be worn at all times for safety. Where to find support: Many individuals and organizations offer support. These include:  Support groups for people with dementia and for caregivers.  Counselors or therapists.  Home health care services.  Adult day care centers. Where to find more information Alzheimer's Association: LimitLaws.huwww.alz.org Contact a health care provider if:  The person's health is rapidly getting worse.  You are no longer able to care for the person.  Caring for the person is affecting your physical and emotional health.  The person threatens himself or herself, you, or anyone else. Summary  Dementia is a term used to describe a number of symptoms that affect memory and thinking.  Dementia usually gets worse slowly over time.  Take steps to reduce the  person's risk of injury, and to plan for future care.  Caregivers need support, relief from caregiving, and time for their own lives. This information is not intended to replace advice given to you by your health care provider. Make sure you discuss any questions you have with your health care provider. Document Released: 08/22/2016 Document Revised: 08/31/2017 Document Reviewed: 08/22/2016 Elsevier Patient Education  2020 [employer]

## 2019-07-02 ENCOUNTER — Other Ambulatory Visit: Payer: Self-pay | Admitting: Neurology

## 2019-07-02 ENCOUNTER — Encounter: Payer: Self-pay | Admitting: Family Medicine

## 2019-07-02 ENCOUNTER — Ambulatory Visit (INDEPENDENT_AMBULATORY_CARE_PROVIDER_SITE_OTHER): Payer: Medicare Other | Admitting: Family Medicine

## 2019-07-02 ENCOUNTER — Other Ambulatory Visit: Payer: Self-pay

## 2019-07-02 VITALS — BP 138/70 | HR 55 | Temp 96.9°F | Ht 66.0 in | Wt 125.2 lb

## 2019-07-02 DIAGNOSIS — F02818 Dementia in other diseases classified elsewhere, unspecified severity, with other behavioral disturbance: Secondary | ICD-10-CM

## 2019-07-02 DIAGNOSIS — G609 Hereditary and idiopathic neuropathy, unspecified: Secondary | ICD-10-CM | POA: Diagnosis not present

## 2019-07-02 DIAGNOSIS — F29 Unspecified psychosis not due to a substance or known physiological condition: Secondary | ICD-10-CM

## 2019-07-02 DIAGNOSIS — G20 Parkinson's disease: Secondary | ICD-10-CM

## 2019-07-02 DIAGNOSIS — G2581 Restless legs syndrome: Secondary | ICD-10-CM | POA: Diagnosis not present

## 2019-07-02 DIAGNOSIS — F513 Sleepwalking [somnambulism]: Secondary | ICD-10-CM

## 2019-07-02 DIAGNOSIS — F0281 Dementia in other diseases classified elsewhere with behavioral disturbance: Secondary | ICD-10-CM

## 2019-07-02 DIAGNOSIS — G4752 REM sleep behavior disorder: Secondary | ICD-10-CM

## 2019-07-04 ENCOUNTER — Other Ambulatory Visit: Payer: Self-pay | Admitting: Neurology

## 2019-07-04 DIAGNOSIS — F513 Sleepwalking [somnambulism]: Secondary | ICD-10-CM

## 2019-07-04 DIAGNOSIS — F0281 Dementia in other diseases classified elsewhere with behavioral disturbance: Secondary | ICD-10-CM

## 2019-07-04 DIAGNOSIS — F29 Unspecified psychosis not due to a substance or known physiological condition: Secondary | ICD-10-CM

## 2019-07-04 DIAGNOSIS — F02818 Dementia in other diseases classified elsewhere, unspecified severity, with other behavioral disturbance: Secondary | ICD-10-CM

## 2019-07-07 ENCOUNTER — Other Ambulatory Visit: Payer: Self-pay | Admitting: Neurology

## 2019-07-30 ENCOUNTER — Other Ambulatory Visit: Payer: Self-pay | Admitting: Neurology

## 2019-07-30 DIAGNOSIS — F02818 Dementia in other diseases classified elsewhere, unspecified severity, with other behavioral disturbance: Secondary | ICD-10-CM

## 2019-07-30 DIAGNOSIS — F29 Unspecified psychosis not due to a substance or known physiological condition: Secondary | ICD-10-CM

## 2019-07-30 DIAGNOSIS — F0281 Dementia in other diseases classified elsewhere with behavioral disturbance: Secondary | ICD-10-CM

## 2019-07-30 DIAGNOSIS — F513 Sleepwalking [somnambulism]: Secondary | ICD-10-CM

## 2019-09-18 ENCOUNTER — Telehealth: Payer: Self-pay

## 2019-09-18 NOTE — Telephone Encounter (Signed)
Phone call placed to patient/wife to introduce Palliative Care and to offer to schedule a visit with NP. Spoke with patient's son, Wesley Pierce. Verbal consent obtained. Telehealth visit scheduled for 09/23/2019 @ 4pm

## 2019-09-23 ENCOUNTER — Other Ambulatory Visit: Payer: Self-pay

## 2019-09-23 ENCOUNTER — Encounter: Payer: Self-pay | Admitting: Internal Medicine

## 2019-09-23 ENCOUNTER — Other Ambulatory Visit: Payer: Self-pay | Admitting: Neurology

## 2019-09-23 ENCOUNTER — Other Ambulatory Visit: Payer: Medicare Other | Admitting: Internal Medicine

## 2019-09-23 DIAGNOSIS — F0281 Dementia in other diseases classified elsewhere with behavioral disturbance: Secondary | ICD-10-CM

## 2019-09-23 DIAGNOSIS — Z515 Encounter for palliative care: Secondary | ICD-10-CM

## 2019-09-23 DIAGNOSIS — G20 Parkinson's disease: Secondary | ICD-10-CM

## 2019-09-23 NOTE — Progress Notes (Signed)
Dec 22nd, 2020 [facility] Palliative Care Consult Note Telephone: [phone]  Fax: [phone]  Due to the current COVID-19 infection/crises, the family prefer, and have given their verbal consent for, a provider visit via telemedicine. HIPPA policies of confidentially were discussed. Video-audio (telehealth) contact was unable to be done due technical barriers from the patient's side. PATIENT NAME: Wesley Pierce DOB: 1931-09-26 MRN: [id number] [address] [place] [address] [phone]/home or [phone]/cell  PRIMARY CARE PROVIDER:   [name], MD [address] [place],  [place] [address]  REFERRING PROVIDER:  [name], MD  RESPONSIBLE PARTY: (son/HCPOA) Wesley Pierce [phone], D-I-L Wesley Pierce  ASSESSMENT / RECOMMENDATIONS:  1. Advance Care Planning: A. Directives: Discussed.  Son / Truddie Coco requests DNR.  I completed 2 copies of the form, will mail them to family, and upload to [facility] EMR. I advised Wesley Pierce to place one copy on the fridge, where EMS would be able to find it should they be called, and carry the other copy with the patient should he leave the home. We discussed the sections of the MOST form in brief. I will mail Wesley Pierce a blank form with accompanying educational material, for his review. B. Goals of Care: Caring for patient the very best they can, in the setting of severe dementia. 2. Symptom Management: Severe dementia; FAST 7A. Speech can be clear but totally off topic. This has progressed over the last 3 months, when speech was less non-sensical. He is difficult to redirect. Only rarely combative; not thought a threat. He is on Nemenda 10mg  bid. On Trintellix to help with mood.  Tendency to wander aimlessly about home. Ambulates with fairly steady gait. He did have a fall about 1 month ago when climbing out of bed.  He is unable to cognate how to use cane or walker. He has poor safety awareness and is in  danger of elopement. Son has deadbolts on all doors and has had to remove knobs from the doors, and the stove. Patient wanders about the home during the night, which had presented a problem for the family. Attempts to improve this with use of Melatonin late morning and at hs with limited success. On Seroquel 25mg  in the afternoon; evening dose Seroquel was increased 2 weeks earlier from 100mg  to 150mg . Family notes a little improvement with bedtime sleeping on increased dose. He doesn't nap during the day. Patient is on Sinemet with meals tid. Interestingly, family reports that patient doesn't exhibit any stiffness during the day,  and no appreciable improvement in patient's movement after Sinemet dosing.  Patient is incontinent of bowel and bladder. He is dependent for hygiene and dressing. Patient has h/o painful feet. Son is not sure if this is still a factor in wandering, as patient can't understand the query. Family has been trialing Tylenol 1000mg  tid in case this pain contributing to nocturnal wakefulness. On Requip at hs for restless leg syndrome. Weight 3 months ago was 125lbs. At a height of 5\' 6"  his BMI was 20.21kg/m2. Son feels weight is about the same. Patient can feed himself, but son has increasingly has to feed.   -consider increase of evening dose Seroquel to 200mg .   -trial holding evening dose of Sinemet to help decrease ability to wander at night. More rarely Sinemet can cause insomnia and confusion, so maybe we can see some improvement in those symptoms.   -Wean Namenda to stop; unlikely benefit for severe dementia.   -Consider evening dose hydroxyzine for sedation;  10 mg 1-2 tabs q hs. 4. Family Supports: Patient and his wife live in the home of his [name]) and his daughter in [place] Wesley Pierce. Patient has another son who live in the area (not involved in care) and a daughter who lives 5 hours away. Son Wesley Landry has stepchildren who live in [place].  Patient's wife Wesley Pierce was helping with  the majority of patient's care, but she suffered a broken arm not long ago, and is currently in the hospital undergoing evaluation for a bowel obstruction.  5. Follow up Palliative Care Visit: Will call family tomorrow; f/u if holding evening dose Sinemet was helpful, let know to increase evening dose Seroquel to 200mg . I'm going to double check for hospice eligibility setting of severe and progressive dementia.  I spent 60 minutes providing this consultation from 4pm to 5pm. More than 50% of the time in this consultation was spent coordinating communication.   HISTORY OF PRESENT ILLNESS:  Wesley Pierce is an 85 y.o. male with h/o Interstitial lung disease, cognitive decline, Parkinson's disease, and GERD Palliative Care was asked to help address goals of care.   CODE STATUS: DNR  PPS: 40%  HOSPICE ELIGIBILITY/DIAGNOSIS: TBD  PAST MEDICAL HISTORY:  Past Medical History:  Diagnosis Date  . B12 deficiency   . Cognitive decline   . GERD (gastroesophageal reflux disease)   . Hemorrhoids   . Hyperlipidemia   . Interstitial lung disease ([place])   . Memory loss   . 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 Osteoarthritis   . Parkinson disease ([place])   . Pleural thickening   . Restless legs syndrome (RLS)     SOCIAL HX:  Social History   Tobacco Use  . Smoking status: Never Smoker  . Smokeless tobacco: Never Used  Substance Use Topics  . Alcohol use: No    ALLERGIES:  Allergies  Allergen Reactions  . Advair Diskus [Fluticasone-Salmeterol]     anxiety  . Aloe Swelling  . Ambien [Zolpidem Tartrate] Other (See Comments)    Sleep walking  . Halcion [Triazolam]     Sleep Walking  . Horizant [Gabapentin]     Sleep walking and halucinations  . Lyrica [Pregabalin] Other (See Comments)    Sleep walking   . Sulfonamide Derivatives     Pt unable to remember  . Ciprofloxacin Rash     PERTINENT MEDICATIONS:  Outpatient Encounter Medications  as of 09/23/2019  Medication Sig  . acetaminophen (TYLENOL) 325 MG tablet Take 650 mg by mouth as needed.    Marland Kitchen aspirin 81 MG tablet Take 81 mg by mouth daily.    . carbidopa-levodopa (SINEMET IR) 25-100 MG tablet TAKE 1 TABLET BY MOUTH 3 TIMES A DAY.  . ergocalciferol (VITAMIN D2) 50000 UNITS capsule Take 50,000 Units by mouth once a week.    . fluticasone (FLONASE) 50 MCG/ACT nasal spray 2 sprays by Nasal route daily.    Marland Kitchen glucosamine-chondroitin 500-400 MG tablet Take 1 tablet by mouth daily.  Marland Kitchen guaiFENesin (MUCINEX) 600 MG 12 hr tablet Take 600 mg by mouth as needed.   Marland Kitchen MELATONIN PO Take by mouth 2 (two) times daily.  . memantine (NAMENDA) 10 MG tablet TAKE 1 TABLET BY MOUTH TWICE DAILY.  Marland Kitchen OVER THE COUNTER MEDICATION Probiotic  One daily  . OVER THE COUNTER MEDICATION One tab after dinner, 2 at bedtime  . pantoprazole (PROTONIX) 40 MG tablet Take 40 mg by mouth daily.  . QUEtiapine (SEROQUEL)  25 MG tablet TAKE ONE TABLET IN THE AFTERNOON.  . QUEtiapine (SEROQUEL) 50 MG tablet TAKE 3 TABLETS AT BEDTIME.  Marland Kitchen. rOPINIRole (REQUIP) 1 MG tablet TAKE 1 & 1/2 TABLETS AT BEDTIME.  . salmeterol (SEREVENT DISKUS) 50 MCG/DOSE diskus inhaler Inhale 1 puff into the lungs 2 (two) times daily.  . vitamin B-12 (CYANOCOBALAMIN) 1000 MCG tablet Take 1,000 mcg by mouth daily.  . Vortioxetine HBr (TRINTELLIX) 10 MG TABS Take 1 tablet (10 mg total) by mouth every morning.  Marland Kitchen. albuterol (PROAIR HFA) 108 (90 Base) MCG/ACT inhaler Inhale 2 puffs into the lungs every 6 (six) hours as needed for wheezing or shortness of breath. (Patient not taking: Reported on 09/23/2019)  . polyethylene glycol (MIRALAX / GLYCOLAX) packet Take 17 g by mouth daily.  . [DISCONTINUED] QUEtiapine (SEROQUEL) 50 MG tablet Take 3 tablets (150 mg total) by mouth at bedtime.  . [DISCONTINUED] saw palmetto 80 MG capsule Take 80 mg by mouth daily.  . [DISCONTINUED] UNABLE TO FIND Osteo Bi-flex - as needed for joint pain   No  facility-administered encounter medications on file as of 09/23/2019.    PHYSICAL EXAM:   Deferred d/t telehealth/audio only nature of visit.  Anselm LisMary P Drew Herman, NP

## 2019-09-24 ENCOUNTER — Telehealth: Payer: Self-pay | Admitting: Internal Medicine

## 2019-09-24 NOTE — Telephone Encounter (Signed)
1pm:  F/U TC to PCG son Richardson Landry [phone]):  Patient better last night (no wandering behavior), with omittance of after dinner dose of Sinemet. Patient went up the stairs to his bedroom, opened the door and went to bed.  Shared that I checked back with Dr. Karie Georges for re-referral to hospice (Parkinson's dementia); Dr. Karie Georges agreeable to re-consult for this. Dr. Joya Salm is out of office till Jan 4th, so left request for Hospice referral with Dr. Harley Hallmark cross covering. Awaiting call back.   Violeta Gelinas NP-C [facility] [phone]

## 2019-09-30 ENCOUNTER — Telehealth: Payer: Self-pay | Admitting: Internal Medicine

## 2019-09-30 NOTE — Telephone Encounter (Signed)
3pm: I had received (12/23/202) a voice message from the office of Dr. Reynaldo Minium, form his cross covering Dr.Mark Joylene Draft, via [facility] (staff nurse [phone]). I received the message late, d/t technical difficulities with my phone. Dr. Joylene Draft was agreeable to refer patient for hospice services. Dr. Yetta Numbers to continue to serve as attending physician. Agreeable to increase of Seroquel to 200mg  qhs, for improvement in evening wandering / agitation behaviors.  Violeta Gelinas NP-C [phone]

## 2019-10-02 ENCOUNTER — Telehealth: Payer: Self-pay | Admitting: Internal Medicine

## 2019-10-02 NOTE — Telephone Encounter (Signed)
10/01/2019 Assessment consult from [facility]:   Patient reassessed for [facility]; no significant changes since last evaluation 09/12/2019; did not meet hospice eligibility criteria at this time. Patient will remain on Palliative Care services; I'll continue to monitor for symptom management. Family aware.  Violeta Gelinas NP-C [phone]

## 2019-10-14 ENCOUNTER — Emergency Department (HOSPITAL_COMMUNITY): Payer: Medicare Other

## 2019-10-14 ENCOUNTER — Observation Stay (HOSPITAL_COMMUNITY): Payer: Medicare Other

## 2019-10-14 ENCOUNTER — Encounter (HOSPITAL_COMMUNITY): Payer: Self-pay | Admitting: Emergency Medicine

## 2019-10-14 ENCOUNTER — Inpatient Hospital Stay (HOSPITAL_COMMUNITY)
Admission: EM | Admit: 2019-10-14 | Discharge: 2019-10-16 | DRG: 558 | Disposition: A | Discharge status: Hospice | Payer: Medicare Other | Attending: Family Medicine | Admitting: Family Medicine

## 2019-10-14 ENCOUNTER — Other Ambulatory Visit: Payer: Self-pay

## 2019-10-14 DIAGNOSIS — E538 Deficiency of other specified B group vitamins: Secondary | ICD-10-CM | POA: Diagnosis present

## 2019-10-14 DIAGNOSIS — J849 Interstitial pulmonary disease, unspecified: Secondary | ICD-10-CM | POA: Diagnosis present

## 2019-10-14 DIAGNOSIS — M1990 Unspecified osteoarthritis, unspecified site: Secondary | ICD-10-CM | POA: Diagnosis present

## 2019-10-14 DIAGNOSIS — M6282 Rhabdomyolysis: Secondary | ICD-10-CM | POA: Diagnosis not present

## 2019-10-14 DIAGNOSIS — Z820 Family history of epilepsy and other diseases of the nervous system: Secondary | ICD-10-CM

## 2019-10-14 DIAGNOSIS — T796XXA Traumatic ischemia of muscle, initial encounter: Secondary | ICD-10-CM

## 2019-10-14 DIAGNOSIS — E785 Hyperlipidemia, unspecified: Secondary | ICD-10-CM | POA: Diagnosis present

## 2019-10-14 DIAGNOSIS — W19XXXA Unspecified fall, initial encounter: Secondary | ICD-10-CM | POA: Diagnosis present

## 2019-10-14 DIAGNOSIS — Y92009 Unspecified place in unspecified non-institutional (private) residence as the place of occurrence of the external cause: Secondary | ICD-10-CM

## 2019-10-14 DIAGNOSIS — J8409 Other alveolar and parieto-alveolar conditions: Secondary | ICD-10-CM | POA: Diagnosis present

## 2019-10-14 DIAGNOSIS — F0281 Dementia in other diseases classified elsewhere with behavioral disturbance: Secondary | ICD-10-CM | POA: Diagnosis present

## 2019-10-14 DIAGNOSIS — Z9181 History of falling: Secondary | ICD-10-CM

## 2019-10-14 DIAGNOSIS — G20 Parkinson's disease: Secondary | ICD-10-CM | POA: Diagnosis present

## 2019-10-14 DIAGNOSIS — Z882 Allergy status to sulfonamides status: Secondary | ICD-10-CM

## 2019-10-14 DIAGNOSIS — G2581 Restless legs syndrome: Secondary | ICD-10-CM | POA: Diagnosis present

## 2019-10-14 DIAGNOSIS — S0081XA Abrasion of other part of head, initial encounter: Secondary | ICD-10-CM | POA: Diagnosis present

## 2019-10-14 DIAGNOSIS — Z888 Allergy status to other drugs, medicaments and biological substances status: Secondary | ICD-10-CM

## 2019-10-14 DIAGNOSIS — M4854XA Collapsed vertebra, not elsewhere classified, thoracic region, initial encounter for fracture: Secondary | ICD-10-CM | POA: Diagnosis present

## 2019-10-14 DIAGNOSIS — F0280 Dementia in other diseases classified elsewhere without behavioral disturbance: Secondary | ICD-10-CM | POA: Diagnosis present

## 2019-10-14 DIAGNOSIS — Z7982 Long term (current) use of aspirin: Secondary | ICD-10-CM

## 2019-10-14 DIAGNOSIS — S22000A Wedge compression fracture of unspecified thoracic vertebra, initial encounter for closed fracture: Secondary | ICD-10-CM | POA: Diagnosis present

## 2019-10-14 DIAGNOSIS — I3139 Other pericardial effusion (noninflammatory): Secondary | ICD-10-CM | POA: Diagnosis present

## 2019-10-14 DIAGNOSIS — F05 Delirium due to known physiological condition: Secondary | ICD-10-CM | POA: Diagnosis present

## 2019-10-14 DIAGNOSIS — S0990XA Unspecified injury of head, initial encounter: Secondary | ICD-10-CM

## 2019-10-14 DIAGNOSIS — Z66 Do not resuscitate: Secondary | ICD-10-CM | POA: Diagnosis present

## 2019-10-14 DIAGNOSIS — Z515 Encounter for palliative care: Secondary | ICD-10-CM | POA: Diagnosis present

## 2019-10-14 DIAGNOSIS — Z79899 Other long term (current) drug therapy: Secondary | ICD-10-CM

## 2019-10-14 DIAGNOSIS — Z20822 Contact with and (suspected) exposure to covid-19: Secondary | ICD-10-CM | POA: Diagnosis present

## 2019-10-14 DIAGNOSIS — I313 Pericardial effusion (noninflammatory): Secondary | ICD-10-CM | POA: Diagnosis present

## 2019-10-14 DIAGNOSIS — R296 Repeated falls: Secondary | ICD-10-CM | POA: Diagnosis present

## 2019-10-14 DIAGNOSIS — K219 Gastro-esophageal reflux disease without esophagitis: Secondary | ICD-10-CM | POA: Diagnosis present

## 2019-10-14 LAB — BASIC METABOLIC PANEL
Anion gap: 9 (ref 5–15)
BUN: 18 mg/dL (ref 8–23)
CO2: 26 mmol/L (ref 22–32)
Calcium: 8.8 mg/dL — ABNORMAL LOW (ref 8.9–10.3)
Chloride: 106 mmol/L (ref 98–111)
Creatinine, Ser: 0.7 mg/dL (ref 0.61–1.24)
GFR calc Af Amer: >60 mL/min (ref >60)
GFR calc non Af Amer: >60 mL/min (ref >60)
Glucose, Bld: 105 mg/dL — ABNORMAL HIGH (ref 70–99)
Potassium: 4.4 mmol/L (ref 3.5–5.1)
Sodium: 141 mmol/L (ref 135–145)

## 2019-10-14 LAB — CREATININE, SERUM
Creatinine, Ser: 0.79 mg/dL (ref 0.61–1.24)
GFR calc Af Amer: >60 mL/min (ref >60)
GFR calc non Af Amer: >60 mL/min (ref >60)

## 2019-10-14 LAB — CK: Total CK: 1596 U/L — ABNORMAL HIGH (ref 49–397)

## 2019-10-14 LAB — CBC WITH DIFFERENTIAL/PLATELET
Abs Immature Granulocytes: 0.06 10*3/uL (ref 0.00–0.07)
Basophils Absolute: 0 10*3/uL (ref 0.0–0.1)
Basophils Relative: 0 %
Eosinophils Absolute: 0.2 10*3/uL (ref 0.0–0.5)
Eosinophils Relative: 2 %
HCT: 39.6 % (ref 39.0–52.0)
Hemoglobin: 12.9 g/dL — ABNORMAL LOW (ref 13.0–17.0)
Immature Granulocytes: 1 %
Lymphocytes Relative: 7 %
Lymphs Abs: 0.7 10*3/uL (ref 0.7–4.0)
MCH: 30.9 pg (ref 26.0–34.0)
MCHC: 32.6 g/dL (ref 30.0–36.0)
MCV: 94.7 fL (ref 80.0–100.0)
Monocytes Absolute: 0.8 10*3/uL (ref 0.1–1.0)
Monocytes Relative: 8 %
Neutro Abs: 8.1 10*3/uL — ABNORMAL HIGH (ref 1.7–7.7)
Neutrophils Relative %: 82 %
Platelets: 150 10*3/uL (ref 150–400)
RBC: 4.18 MIL/uL — ABNORMAL LOW (ref 4.22–5.81)
RDW: 12.7 % (ref 11.5–15.5)
WBC: 10 10*3/uL (ref 4.0–10.5)
nRBC: 0 % (ref 0.0–0.2)

## 2019-10-14 LAB — CBC
HCT: 40.7 % (ref 39.0–52.0)
Hemoglobin: 13.6 g/dL (ref 13.0–17.0)
MCH: 31 pg (ref 26.0–34.0)
MCHC: 33.4 g/dL (ref 30.0–36.0)
MCV: 92.7 fL (ref 80.0–100.0)
Platelets: 148 10*3/uL — ABNORMAL LOW (ref 150–400)
RBC: 4.39 MIL/uL (ref 4.22–5.81)
RDW: 12.7 % (ref 11.5–15.5)
WBC: 11.4 10*3/uL — ABNORMAL HIGH (ref 4.0–10.5)
nRBC: 0 % (ref 0.0–0.2)

## 2019-10-14 LAB — TROPONIN I (HIGH SENSITIVITY)
Troponin I (High Sensitivity): 11 ng/L (ref <18)
Troponin I (High Sensitivity): 12 ng/L (ref <18)

## 2019-10-14 LAB — D-DIMER, QUANTITATIVE: D-Dimer, Quant: 6.98 ug/mL-FEU — ABNORMAL HIGH (ref 0.00–0.50)

## 2019-10-14 LAB — SARS CORONAVIRUS 2 (TAT 6-24 HRS): SARS Coronavirus 2: NEGATIVE

## 2019-10-14 MED ORDER — ENOXAPARIN SODIUM 40 MG/0.4ML ~~LOC~~ SOLN
40.0000 mg | SUBCUTANEOUS | Status: DC
  Administered 2019-10-14: 40 mg via SUBCUTANEOUS
  Filled 2019-10-14 (×2): qty 0.4

## 2019-10-14 MED ORDER — SODIUM CHLORIDE 0.9 % IV SOLN: INTRAVENOUS | Status: DC

## 2019-10-14 MED ORDER — ARFORMOTEROL TARTRATE 15 MCG/2ML IN NEBU
15.0000 ug | INHALATION_SOLUTION | Freq: Two times a day (BID) | RESPIRATORY_TRACT | Status: DC
  Administered 2019-10-14 – 2019-10-16 (×4): 15 ug via RESPIRATORY_TRACT
  Filled 2019-10-14 (×4): qty 2

## 2019-10-14 MED ORDER — MELATONIN 3 MG PO TABS
9.0000 mg | ORAL_TABLET | Freq: Two times a day (BID) | ORAL | Status: DC
  Filled 2019-10-14: qty 3

## 2019-10-14 MED ORDER — ACETAMINOPHEN 325 MG PO TABS
650.0000 mg | ORAL_TABLET | Freq: Four times a day (QID) | ORAL | Status: DC | PRN
  Administered 2019-10-14: 650 mg via ORAL
  Filled 2019-10-14: qty 2

## 2019-10-14 MED ORDER — ACETAMINOPHEN 650 MG RE SUPP: 650.0000 mg | Freq: Four times a day (QID) | RECTAL | Status: DC | PRN

## 2019-10-14 MED ORDER — QUETIAPINE FUMARATE 25 MG PO TABS
25.0000 mg | ORAL_TABLET | Freq: Every day | ORAL | Status: DC
  Administered 2019-10-15 – 2019-10-16 (×2): 25 mg via ORAL
  Filled 2019-10-14 (×3): qty 1

## 2019-10-14 MED ORDER — PANTOPRAZOLE SODIUM 40 MG PO TBEC
40.0000 mg | DELAYED_RELEASE_TABLET | Freq: Every day | ORAL | Status: DC
  Administered 2019-10-14 – 2019-10-16 (×3): 40 mg via ORAL
  Filled 2019-10-14 (×3): qty 1

## 2019-10-14 MED ORDER — ROPINIROLE HCL 0.5 MG PO TABS
2.0000 mg | ORAL_TABLET | Freq: Every day | ORAL | Status: DC
  Administered 2019-10-14 – 2019-10-15 (×2): 2 mg via ORAL
  Filled 2019-10-14 (×2): qty 4
  Filled 2019-10-14: qty 2
  Filled 2019-10-14: qty 4
  Filled 2019-10-14: qty 2

## 2019-10-14 MED ORDER — MEMANTINE HCL 10 MG PO TABS
10.0000 mg | ORAL_TABLET | Freq: Two times a day (BID) | ORAL | Status: DC
  Administered 2019-10-14 – 2019-10-16 (×4): 10 mg via ORAL
  Filled 2019-10-14 (×5): qty 1

## 2019-10-14 MED ORDER — POLYETHYLENE GLYCOL 3350 17 G PO PACK: 17.0000 g | PACK | Freq: Every day | ORAL | Status: DC | PRN

## 2019-10-14 MED ORDER — IOHEXOL 350 MG/ML SOLN
75.0000 mL | Freq: Once | INTRAVENOUS | Status: AC | PRN
  Administered 2019-10-14: 75 mL via INTRAVENOUS

## 2019-10-14 MED ORDER — SODIUM CHLORIDE 0.9 % IV BOLUS
500.0000 mL | Freq: Once | INTRAVENOUS | Status: AC
  Administered 2019-10-14: 500 mL via INTRAVENOUS

## 2019-10-14 MED ORDER — QUETIAPINE FUMARATE 25 MG PO TABS
150.0000 mg | ORAL_TABLET | Freq: Every day | ORAL | Status: DC
  Administered 2019-10-14 – 2019-10-15 (×2): 150 mg via ORAL
  Filled 2019-10-14 (×2): qty 2

## 2019-10-14 MED ORDER — ACETAMINOPHEN 500 MG PO TABS
1000.0000 mg | ORAL_TABLET | Freq: Every evening | ORAL | Status: DC
  Administered 2019-10-15: 1000 mg via ORAL
  Filled 2019-10-14: qty 2

## 2019-10-14 MED ORDER — CARBIDOPA-LEVODOPA 25-100 MG PO TABS
1.0000 | ORAL_TABLET | Freq: Three times a day (TID) | ORAL | Status: DC
  Administered 2019-10-14 – 2019-10-16 (×5): 1 via ORAL
  Filled 2019-10-14 (×5): qty 1

## 2019-10-14 MED ORDER — ONDANSETRON HCL 4 MG/2ML IJ SOLN: 4.0000 mg | Freq: Four times a day (QID) | INTRAMUSCULAR | Status: DC | PRN

## 2019-10-14 MED ORDER — ASPIRIN EC 81 MG PO TBEC
81.0000 mg | DELAYED_RELEASE_TABLET | Freq: Every day | ORAL | Status: DC
  Administered 2019-10-14 – 2019-10-16 (×3): 81 mg via ORAL
  Filled 2019-10-14 (×3): qty 1

## 2019-10-14 MED ORDER — VITAMIN B-12 1000 MCG PO TABS
1000.0000 ug | ORAL_TABLET | Freq: Every day | ORAL | Status: DC
  Administered 2019-10-14 – 2019-10-16 (×3): 1000 ug via ORAL
  Filled 2019-10-14 (×3): qty 1

## 2019-10-14 MED ORDER — MELATONIN 3 MG PO TABS
9.0000 mg | ORAL_TABLET | Freq: Two times a day (BID) | ORAL | Status: DC
  Administered 2019-10-14 – 2019-10-15 (×3): 9 mg via ORAL
  Filled 2019-10-14 (×4): qty 3

## 2019-10-14 MED ORDER — VORTIOXETINE HBR 20 MG PO TABS
10.0000 mg | ORAL_TABLET | Freq: Every morning | ORAL | Status: DC
  Administered 2019-10-15 – 2019-10-16 (×2): 10 mg via ORAL
  Filled 2019-10-14 (×2): qty 0.5

## 2019-10-14 MED ORDER — ONDANSETRON HCL 4 MG PO TABS: 4.0000 mg | ORAL_TABLET | Freq: Four times a day (QID) | ORAL | Status: DC | PRN

## 2019-10-14 MED ORDER — QUETIAPINE FUMARATE 25 MG PO TABS: 150.0000 mg | ORAL_TABLET | Freq: Every day | ORAL | Status: DC

## 2019-10-14 MED ORDER — VITAMIN D (ERGOCALCIFEROL) 1.25 MG (50000 UNIT) PO CAPS
50000.0000 [IU] | ORAL_CAPSULE | ORAL | Status: DC
  Filled 2019-10-14: qty 1

## 2019-10-14 MED ORDER — ALBUTEROL SULFATE HFA 108 (90 BASE) MCG/ACT IN AERS
2.0000 | INHALATION_SPRAY | Freq: Four times a day (QID) | RESPIRATORY_TRACT | Status: DC | PRN
  Filled 2019-10-14: qty 6.7

## 2019-10-14 NOTE — ED Provider Notes (Signed)
[facility] EMERGENCY DEPARTMENT Provider Note   CSN: [id number] Arrival date & time: 10/14/19  1121     History Chief Complaint  Patient presents with  . Fall    Wesley Pierce is a 86 y.o. male.  Patient is an 86 year old male with history of dementia, Parkinson's, interstitial lung disease.  He is brought by EMS for evaluation of a fall.  I am told he fell yesterday morning, then yesterday evening seemed less active and less talkative.  According to family, he spent the night sleeping on the floor.  Patient unable to add additional history secondary to dementia.  The history is provided by the EMS personnel.       Past Medical History:  Diagnosis Date  . B12 deficiency   . Cognitive decline   . GERD (gastroesophageal reflux disease)   . Hemorrhoids   . Hyperlipidemia   . Interstitial lung disease ([place])   . Memory loss   . 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 Osteoarthritis   . Parkinson disease ([place])   . Pleural thickening   . Restless legs syndrome (RLS)     Patient Active Problem List   Diagnosis Date Noted  . Dysphagia 03/03/2016  . CAP (community acquired pneumonia) 02/29/2016  . Dementia due to Parkinson's disease with behavioral disturbance ([place]) 09/13/2015  . Parkinson's disease dementia ([place]) 07/18/2015  . 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 RLS (restless legs syndrome) 03/05/2015  . B12 deficiency 03/05/2015  . Hereditary and idiopathic peripheral neuropathy 03/05/2015  . Parkinson disease ([place]) 03/05/2015  . Parasomnia 03/04/2015  . Parkinsonism ([place]) 08/03/2014  . Post-operative state 09/16/2012  . Acute sinusitis 08/16/2012  . Anal stenosis 08/12/2012  . Acute bronchitis 05/01/2012  . Hemorrhoids 03/08/2012  . Hemorrhoids 02/05/2012  . COPD (chronic obstructive pulmonary disease) ([place]) 05/29/2011  . PERSISTENT DISORDER  INITIATING/MAINTAINING SLEEP 08/30/2009  . UNSPEC ALVEOLAR&PARIETOALVEOLAR PNEUMONOPATHY 06/02/2009  . HYPERLIPIDEMIA 06/01/2009  . RESTLESS LEG SYNDROME 06/01/2009  . Allergic rhinitis 06/01/2009  . GERD 06/01/2009  . OSTEOARTHRITIS 06/01/2009    Past Surgical History:  Procedure Laterality Date  . CATARACT EXTRACTION  03/2008  . COLONOSCOPY    . EXAMINATION UNDER ANESTHESIA  08/27/2012   Procedure: EXAM UNDER ANESTHESIA;  Surgeon: Marcello Moores A. Cornett, MD;  Location: [facility];  Service: General;  Laterality: N/A;  . HEMORRHOID SURGERY  06/21/2012   Procedure: HEMORRHOIDECTOMY;  Surgeon: Joyice Faster. Cornett, MD;  Location: [facility];  Service: General;  Laterality: N/A;  . LUNG DECORTICATION  1962       Family History  Problem Relation Age of Onset  . Alzheimer's disease Father   . Dementia Father   . Allergies Brother   . Heart disease Sister   . Stroke Sister   . Clotting disorder Sister     Social History   Tobacco Use  . Smoking status: Never Smoker  . Smokeless tobacco: Never Used  Substance Use Topics  . Alcohol use: No  . Drug use: No    Home Medications Prior to Admission medications   Medication Sig Start Date End Date Taking? Authorizing Provider  acetaminophen (TYLENOL) 325 MG tablet Take 650 mg by mouth as needed.      [provider]  albuterol (PROAIR HFA) 108 (90 Base) MCG/ACT inhaler Inhale 2 puffs into the lungs every 6 (six) hours as needed for wheezing or shortness of breath. Patient  not taking: Reported on 09/23/2019 11/21/17   Leslye Peer, MD  aspirin 81 MG tablet Take 81 mg by mouth daily.      [provider]  carbidopa-levodopa (SINEMET IR) 25-100 MG tablet TAKE 1 TABLET BY MOUTH 3 TIMES A DAY. 07/01/19   Anson Fret, MD  ergocalciferol (VITAMIN D2) 50000 UNITS capsule Take 50,000 Units by mouth once a week.      [provider]  fluticasone (FLONASE) 50 MCG/ACT nasal spray 2  sprays by Nasal route daily.      [provider]  glucosamine-chondroitin 500-400 MG tablet Take 1 tablet by mouth daily.    [provider]  guaiFENesin (MUCINEX) 600 MG 12 hr tablet Take 600 mg by mouth as needed.     [provider]  MELATONIN PO Take by mouth 2 (two) times daily.    [provider]  memantine (NAMENDA) 10 MG tablet TAKE 1 TABLET BY MOUTH TWICE DAILY. 07/07/19   Lomax, Amy, NP  OVER THE COUNTER MEDICATION Probiotic  One daily    [provider]  OVER THE COUNTER MEDICATION One tab after dinner, 2 at bedtime    [provider]  pantoprazole (PROTONIX) 40 MG tablet Take 40 mg by mouth daily. 06/29/16   [provider]  polyethylene glycol (MIRALAX / GLYCOLAX) packet Take 17 g by mouth daily.    [provider]  QUEtiapine (SEROQUEL) 25 MG tablet TAKE ONE TABLET IN THE AFTERNOON. 07/30/19   Anson Fret, MD  QUEtiapine (SEROQUEL) 50 MG tablet TAKE 3 TABLETS AT BEDTIME. 09/23/19   Lomax, Amy, NP  rOPINIRole (REQUIP) 1 MG tablet TAKE 1 & 1/2 TABLETS AT BEDTIME. 06/24/18   Anson Fret, MD  salmeterol (SEREVENT DISKUS) 50 MCG/DOSE diskus inhaler Inhale 1 puff into the lungs 2 (two) times daily. 11/07/18   Leslye Peer, MD  vitamin B-12 (CYANOCOBALAMIN) 1000 MCG tablet Take 1,000 mcg by mouth daily.    [provider]  Vortioxetine HBr (TRINTELLIX) 10 MG TABS Take 1 tablet (10 mg total) by mouth every morning. 12/29/15   Plovsky, Earvin Hansen, MD    Allergies    Advair diskus [fluticasone-salmeterol], Aloe, Ambien [zolpidem tartrate], Halcion [triazolam], Horizant [gabapentin], Lyrica [pregabalin], Sulfonamide derivatives, and Ciprofloxacin  Review of Systems   Review of Systems  Unable to perform ROS: Dementia    Physical Exam Updated Vital Signs BP 125/77 (BP Location: Left Arm)   Pulse (!) 59   Temp 98 F (36.7 C) (Oral)   Resp 16   SpO2 97%   Physical Exam Vitals and nursing note  reviewed.  Constitutional:      General: He is not in acute distress.    Appearance: He is well-developed. He is not diaphoretic.  HENT:     Head: Normocephalic and atraumatic.  Cardiovascular:     Rate and Rhythm: Normal rate and regular rhythm.     Heart sounds: No murmur. No friction rub.  Pulmonary:     Effort: Pulmonary effort is normal. No respiratory distress.     Breath sounds: Normal breath sounds. No wheezing or rales.  Abdominal:     General: Bowel sounds are normal. There is no distension.     Palpations: Abdomen is soft.     Tenderness: There is no abdominal tenderness.  Musculoskeletal:        General: Normal range of motion.     Cervical back: Normal range of motion and neck supple.  Skin:  General: Skin is warm and dry.  Neurological:     General: No focal deficit present.     Mental Status: He is alert and oriented to person, place, and time.     Cranial Nerves: No cranial nerve deficit.     Coordination: Coordination normal.     Comments: Neurologic exam is difficult secondary to mental status/dementia.  He does appear to move all 4 extremities and there are no obvious focal abnormalities.     ED Results / Procedures / Treatments   Labs (all labs ordered are listed, but only abnormal results are displayed) Labs Reviewed  BASIC METABOLIC PANEL  CBC WITH DIFFERENTIAL/PLATELET  CK  CBC WITH DIFFERENTIAL/PLATELET    EKG None  Radiology No results found.  Procedures Procedures (including critical care time)  Medications Ordered in ED Medications  sodium chloride 0.9 % bolus 500 mL (has no administration in time range)    ED Course  I have reviewed the triage vital signs and the nursing notes.  Pertinent labs & imaging results that were available during my care of the patient were reviewed by me and considered in my medical decision making (see chart for details).    MDM Rules/Calculators/A&P  Patient is an 86 year old male brought by EMS  after spending the night on the floor after a fall.  Patient's head CT is unremarkable, but cervical spine CT is suggestive of a possible upper thoracic compression fracture.  Patient unable to describe whether or not he is experiencing pain, so I am uncertain as to the age of this fracture.  His shoulder x-ray is unremarkable.  The most significant abnormality in his work-up is that of an elevated CK of 1600.  As the patient is 86 years old, I feel as though he is at risk for developing rhabdomyolysis.  Patient given IV fluids and will be admitted to the hospitalist service for observation and monitoring of his CK/renal function.  Final Clinical Impression(s) / ED Diagnoses Final diagnoses:  None    Rx / DC Orders ED Discharge Orders    None       Geoffery Lyons, MD 10/14/19 1554

## 2019-10-14 NOTE — Plan of Care (Signed)

## 2019-10-14 NOTE — ED Notes (Signed)
Patient transported to X-ray 

## 2019-10-14 NOTE — ED Triage Notes (Signed)
Pt arrives via EMS from home with reports of a fall yesterday at 10 am, noticed at 6 pm that he was lethargic and not talking. Family reports he crawled onto the floor and stayed there all night. Abrasion to right side of face. Dementia at baseline. C-collar in place. EMS reports pt was responsive to pain for them. Unequal pupils. 18G LAC

## 2019-10-14 NOTE — H&P (Signed)
[facility] History and Physical  Wesley Pierce ION:[id number] DOB: Aug 05, 1931 DOA: 10/14/2019  Referring physician: ED provider PCP: [name], MD   Chief Complaint: Fall at home  HPI: Wesley Pierce is a 86 y.o. male with PMH of dementia, Parkison's disease, interstitial lung disease presenting with fall at home and increase in confusion. He lives with his son who is his caregiver. He had the fall yesterday at 10am and by 6pm was noted to be more lethargic. He slept on the floor at night due to not being able to move up. He has abrasion to the right side of his face. His family called EMS today. VS reported as stable, patient afebrile.   In the ED he had CT head and neck negative for acute findings and mild T2 endplate compression fracture of undetermined age--patient complained of no back pain. His labwork was significant for CK of 1,596 but normal Scr. COVID 19 was ordered but it is pending. D-dimer was elevated to 6.98.    Review of Systems:  Unable to evaluate due to his dementia.    Past Medical History:  Diagnosis Date  . B12 deficiency   . Cognitive decline   . GERD (gastroesophageal reflux disease)   . Hemorrhoids   . Hyperlipidemia   . Interstitial lung disease ([facility])   . Memory loss   . 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 Osteoarthritis   . Parkinson disease ([facility])   . Pleural thickening   . Restless legs syndrome (RLS)    Past Surgical History:  Procedure Laterality Date  . CATARACT EXTRACTION  03/2008  . COLONOSCOPY    . EXAMINATION UNDER ANESTHESIA  08/27/2012   Procedure: EXAM UNDER ANESTHESIA;  Surgeon: Maisie Fus A. Cornett, MD;  Location: [facility];  Service: General;  Laterality: N/A;  . HEMORRHOID SURGERY  06/21/2012   Procedure: HEMORRHOIDECTOMY;  Surgeon: Clovis Pu. Cornett, MD;  Location: [facility];  Service: General;  Laterality: N/A;  . LUNG DECORTICATION  1962     Social History:  reports that he has never smoked. He has never used smokeless tobacco. He reports that he does not drink alcohol or use drugs.  Allergies  Allergen Reactions  . Advair Diskus [Fluticasone-Salmeterol]     anxiety  . Aloe Swelling  . Ambien [Zolpidem Tartrate] Other (See Comments)    Sleep walking  . Halcion [Triazolam]     Sleep Walking  . Horizant [Gabapentin]     Sleep walking and halucinations  . Lyrica [Pregabalin] Other (See Comments)    Sleep walking   . Sulfonamide Derivatives     Pt unable to remember  . Ciprofloxacin Rash    Family History  Problem Relation Age of Onset  . Alzheimer's disease Father   . Dementia Father   . Allergies Brother   . Heart disease Sister   . Stroke Sister   . Clotting disorder Sister      Prior to Admission medications   Medication Sig Start Date End Date Taking? Authorizing Provider  acetaminophen (TYLENOL) 500 MG tablet Take 1,000 mg by mouth daily. Every evening   Yes [provider]  albuterol (PROAIR HFA) 108 (90 Base) MCG/ACT inhaler Inhale 2 puffs into the lungs every 6 (six) hours as needed for wheezing or shortness of breath. 11/21/17  Yes Leslye Peer, MD  aspirin 81 MG tablet Take 81 mg by mouth daily.     Yes [provider]  carbidopa-levodopa (SINEMET IR) 25-100 MG tablet TAKE 1 TABLET BY MOUTH 3 TIMES A DAY. Patient taking differently: Take 1 tablet by mouth 3 (three) times daily.  07/01/19  Yes Anson Fret, MD  ergocalciferol (VITAMIN D2) 50000 UNITS capsule Take 50,000 Units by mouth once a week.     Yes [provider]  fluticasone (FLONASE) 50 MCG/ACT nasal spray 2 sprays by Nasal route daily.     Yes [provider]  glucosamine-chondroitin 500-400 MG tablet Take 1 tablet by mouth daily.   Yes [provider]  guaiFENesin (MUCINEX) 600 MG 12 hr tablet Take 600 mg by mouth as needed.    Yes [provider]  MELATONIN PO Take 10 mg by mouth 2  (two) times daily. 1600 and 2000   Yes [provider]  memantine (NAMENDA) 10 MG tablet TAKE 1 TABLET BY MOUTH TWICE DAILY. Patient taking differently: Take 10 mg by mouth 2 (two) times daily.  07/07/19  Yes Lomax, Amy, NP  OVER THE COUNTER MEDICATION Take 1-2 capsules by mouth See admin instructions. One tab after dinner, 2 at bedtime (CBD )   Yes [provider]  pantoprazole (PROTONIX) 40 MG tablet Take 40 mg by mouth daily. 06/29/16  Yes [provider]  QUEtiapine (SEROQUEL) 25 MG tablet TAKE ONE TABLET IN THE AFTERNOON. Patient taking differently: Take 25 mg by mouth daily. TAKE ONE TABLET IN THE AFTERNOON. @ 1400 07/30/19  Yes Anson Fret, MD  QUEtiapine (SEROQUEL) 50 MG tablet TAKE 3 TABLETS AT BEDTIME. Patient taking differently: Take 200 mg by mouth every evening. @1900  09/23/19  Yes Lomax, Amy, NP  rOPINIRole (REQUIP) 1 MG tablet TAKE 1 & 1/2 TABLETS AT BEDTIME. Patient taking differently: Take 2 mg by mouth at bedtime.  06/24/18  Yes 06/26/18, MD  salmeterol (SEREVENT DISKUS) 50 MCG/DOSE diskus inhaler Inhale 1 puff into the lungs 2 (two) times daily. 11/07/18  Yes 01/06/19, MD  vitamin B-12 (CYANOCOBALAMIN) 1000 MCG tablet Take 1,000 mcg by mouth daily.   Yes [provider]  Vortioxetine HBr (TRINTELLIX) 10 MG TABS Take 1 tablet (10 mg total) by mouth every morning. 12/29/15  Yes Plovsky, 12/31/15, MD   Physical Exam: Vitals:   10/14/19 1126  BP: 125/77  Pulse: (!) 59  Resp: 16  Temp: 98 F (36.7 C)  TempSrc: Oral  SpO2: 97%    Wt Readings from Last 3 Encounters:  07/02/19 56.8 kg  11/26/18 59.4 kg  06/24/18 62.6 kg    General:  Appears calm and comfortable Eyes: PERRL, normal lids, irises & conjunctiva ENT: grossly normal hearing, lips & tongue Neck: supple, nl ROM Cardiovascular: RRR, S1 and S2 present. Respiratory:  Normal respiratory effort. No wheezing.  Abdomen: soft, ntnd Skin: right facial  abrasion Musculoskeletal: grossly normal tone BUE/BLE Psychiatric: Guarded mood and affect Neurologic: Alert, oriented to self, not following commands. No facial droop.           Labs on Admission:  Basic Metabolic Panel: Recent Labs  Lab 10/14/19 1430  NA 141  K 4.4  CL 106  CO2 26  GLUCOSE 105*  BUN 18  CREATININE 0.70  CALCIUM 8.8*   Liver Function Tests: No results for input(s): AST, ALT, ALKPHOS, BILITOT, PROT, ALBUMIN in the last 168 hours. No results for input(s): LIPASE, AMYLASE in the last 168 hours. No results for input(s): AMMONIA in the last 168 hours. CBC: Recent Labs  Lab 10/14/19 1430  WBC 10.0  NEUTROABS 8.1*  HGB 12.9*  HCT 39.6  MCV 94.7  PLT 150   Cardiac Enzymes: Recent Labs  Lab 10/14/19 1430  CKTOTAL 1,596*    BNP (last 3 results) No results for input(s): BNP in the last 8760 hours.  ProBNP (last 3 results) No results for input(s): PROBNP in the last 8760 hours.  CBG: No results for input(s): GLUCAP in the last 168 hours.  Radiological Exams on Admission: DG Shoulder Right  Result Date: 10/14/2019 CLINICAL DATA:  Trauma secondary to a fall. EXAM: RIGHT SHOULDER - 2+ VIEW COMPARISON:  None. FINDINGS: There is no fracture or dislocation. Osteopenia. Minimal osteophyte formation on the inferior rim of the glenoid. Possible calcification in distal rotator cuff. IMPRESSION: No acute abnormality. Minimal degenerative changes. Possible calcific tendinopathy of the rotator cuff. Electronically Signed   By: Lorriane Shire M.D.   On: 10/14/2019 15:36   CT Head Wo Contrast  Result Date: 10/14/2019 CLINICAL DATA:  Headache, posttraumatic. Poly trauma, critical, head/cervical spine injury suspected. Additional history provided: Patient son reports that he fell yesterday. EXAM: CT HEAD WITHOUT CONTRAST CT CERVICAL SPINE WITHOUT CONTRAST TECHNIQUE: Multidetector CT imaging of the head and cervical spine was performed following the standard protocol  without intravenous contrast. Multiplanar CT image reconstructions of the cervical spine were also generated. COMPARISON:  CT head/cervical spine 02/29/2016 FINDINGS: CT HEAD FINDINGS Brain: No evidence of acute intracranial hemorrhage. No demarcated cortical infarction. No evidence of intracranial mass. No midline shift or extra-axial fluid collection. Ill-defined hypoattenuation within the cerebral white matter and pons, progressed from prior examination and nonspecific, but consistent with chronic small vessel ischemic disease. Moderate generalized parenchymal atrophy. Vascular: No hyperdense vessel.  Atherosclerotic calcifications. Skull: Normal. Negative for fracture or focal lesion. Sinuses/Orbits: Visualized orbits demonstrate no acute abnormality. No significant paranasal sinus disease or mastoid effusion at the imaged levels. Other: Probable small left frontal forehead/periorbital hematoma. CT CERVICAL SPINE FINDINGS Alignment: Straightening of the expected cervical lordosis. No significant spondylolisthesis. Skull base and vertebrae: The basion-dental and atlanto-dental intervals are maintained.No evidence of acute fracture to the cervical spine. A mild T2 superior endplate compression deformity is new as compared to CT 02/28/2016, but otherwise age indeterminate. Soft tissues and spinal canal: No prevertebral fluid or swelling. No visible canal hematoma. Bilateral thyroid lobe nodules. The largest nodule within the right lobe measures 2.1 cm (series 5, image 77). Disc levels: Cervical spondylosis with multilevel disc height loss, posterior disc osteophytes, uncovertebral and facet hypertrophy. Severe disc height loss with degenerative fusion across the C5-C6 disc space. There is also degenerative fusion across the posterior disc space at C4-C5 and possibly at C6-C7. Degenerative fusion of the facet joints on the left at C3-C4. Upper chest: No consolidation within the imaged lung apices. No visible  pneumothorax. IMPRESSION: CT head: 1. No evidence of acute intracranial abnormality. 2. Chronic small vessel ischemic disease, progressed as compared to prior examination 02/28/2016. 3. Moderate generalized parenchymal atrophy. CT cervical spine: 1. A mild T2 vertebral body superior endplate compression fracture is new as compared to prior CT 02/28/2016, but otherwise age indeterminate. 2. No evidence of acute fracture to the cervical spine. 3. Cervical spondylosis with sites of degenerative fusion as described. 4. Thyroid nodules, the largest measuring 2.1 cm within the right lobe. Nonemergent thyroid ultrasound is recommended for further evaluation. Electronically Signed   By: Kellie Simmering DO   On: 10/14/2019 12:57   CT Cervical Spine Wo Contrast  Result Date: 10/14/2019 CLINICAL DATA:  Headache, posttraumatic.  Poly trauma, critical, head/cervical spine injury suspected. Additional history provided: Patient son reports that he fell yesterday. EXAM: CT HEAD WITHOUT CONTRAST CT CERVICAL SPINE WITHOUT CONTRAST TECHNIQUE: Multidetector CT imaging of the head and cervical spine was performed following the standard protocol without intravenous contrast. Multiplanar CT image reconstructions of the cervical spine were also generated. COMPARISON:  CT head/cervical spine 02/29/2016 FINDINGS: CT HEAD FINDINGS Brain: No evidence of acute intracranial hemorrhage. No demarcated cortical infarction. No evidence of intracranial mass. No midline shift or extra-axial fluid collection. Ill-defined hypoattenuation within the cerebral white matter and pons, progressed from prior examination and nonspecific, but consistent with chronic small vessel ischemic disease. Moderate generalized parenchymal atrophy. Vascular: No hyperdense vessel.  Atherosclerotic calcifications. Skull: Normal. Negative for fracture or focal lesion. Sinuses/Orbits: Visualized orbits demonstrate no acute abnormality. No significant paranasal sinus disease or  mastoid effusion at the imaged levels. Other: Probable small left frontal forehead/periorbital hematoma. CT CERVICAL SPINE FINDINGS Alignment: Straightening of the expected cervical lordosis. No significant spondylolisthesis. Skull base and vertebrae: The basion-dental and atlanto-dental intervals are maintained.No evidence of acute fracture to the cervical spine. A mild T2 superior endplate compression deformity is new as compared to CT 02/28/2016, but otherwise age indeterminate. Soft tissues and spinal canal: No prevertebral fluid or swelling. No visible canal hematoma. Bilateral thyroid lobe nodules. The largest nodule within the right lobe measures 2.1 cm (series 5, image 77). Disc levels: Cervical spondylosis with multilevel disc height loss, posterior disc osteophytes, uncovertebral and facet hypertrophy. Severe disc height loss with degenerative fusion across the C5-C6 disc space. There is also degenerative fusion across the posterior disc space at C4-C5 and possibly at C6-C7. Degenerative fusion of the facet joints on the left at C3-C4. Upper chest: No consolidation within the imaged lung apices. No visible pneumothorax. IMPRESSION: CT head: 1. No evidence of acute intracranial abnormality. 2. Chronic small vessel ischemic disease, progressed as compared to prior examination 02/28/2016. 3. Moderate generalized parenchymal atrophy. CT cervical spine: 1. A mild T2 vertebral body superior endplate compression fracture is new as compared to prior CT 02/28/2016, but otherwise age indeterminate. 2. No evidence of acute fracture to the cervical spine. 3. Cervical spondylosis with sites of degenerative fusion as described. 4. Thyroid nodules, the largest measuring 2.1 cm within the right lobe. Nonemergent thyroid ultrasound is recommended for further evaluation. Electronically Signed   By: Jackey Loge DO   On: 10/14/2019 12:57     Assessment/Plan Active Problems:    Rhabdomyolysis   1. Rhabdomyolysis Admit as Observation, start IV hydration, monitor Scr, UOP, repeat CK Likely due to decreased po intake though his Scr is normal. UA ordered but pending.   2. Elevated D dimer.  Could be secondary to VTE with prolonged immobilization.  Ordered CTA chest and Doppler bl LE.  No proph anticoagulation as no LE edema and no respiratory distress/tachycardia.   3. Dementia with confusion, possible metabolic encephalopathy COVID 19 pending UA pending Continue home medications SLP eval Palliative care consult as patient if followed by [facility] care in the outpatient setting.   4. Frequent falls at home Needs PT OT, CM consult requested  5. Mild T2 compression fracture.  Patient complaints of no back pain.  Needs PT/OT Tylenol as needed for pain  Code Status: DNR DVT Prophylaxis: Lovenox Family Communication: Son updated in ED Disposition Plan: Admit as Observation but change to IP if not improving clinically.   Time spent: 70 minutes  Ky Barban [facility]

## 2019-10-14 NOTE — ED Notes (Addendum)
Report given to Michelle RN

## 2019-10-14 NOTE — ED Notes (Signed)
Patient transported to CT 

## 2019-10-15 ENCOUNTER — Ambulatory Visit (HOSPITAL_COMMUNITY): Payer: Medicare Other

## 2019-10-15 DIAGNOSIS — J849 Interstitial pulmonary disease, unspecified: Secondary | ICD-10-CM | POA: Diagnosis present

## 2019-10-15 DIAGNOSIS — S0081XA Abrasion of other part of head, initial encounter: Secondary | ICD-10-CM | POA: Diagnosis present

## 2019-10-15 DIAGNOSIS — Z888 Allergy status to other drugs, medicaments and biological substances status: Secondary | ICD-10-CM | POA: Diagnosis not present

## 2019-10-15 DIAGNOSIS — M6282 Rhabdomyolysis: Principal | ICD-10-CM

## 2019-10-15 DIAGNOSIS — G2581 Restless legs syndrome: Secondary | ICD-10-CM | POA: Diagnosis present

## 2019-10-15 DIAGNOSIS — G20 Parkinson's disease: Secondary | ICD-10-CM | POA: Diagnosis not present

## 2019-10-15 DIAGNOSIS — I313 Pericardial effusion (noninflammatory): Secondary | ICD-10-CM | POA: Diagnosis present

## 2019-10-15 DIAGNOSIS — E538 Deficiency of other specified B group vitamins: Secondary | ICD-10-CM | POA: Diagnosis not present

## 2019-10-15 DIAGNOSIS — S22000A Wedge compression fracture of unspecified thoracic vertebra, initial encounter for closed fracture: Secondary | ICD-10-CM | POA: Diagnosis not present

## 2019-10-15 DIAGNOSIS — Z882 Allergy status to sulfonamides status: Secondary | ICD-10-CM | POA: Diagnosis not present

## 2019-10-15 DIAGNOSIS — T796XXD Traumatic ischemia of muscle, subsequent encounter: Secondary | ICD-10-CM | POA: Diagnosis not present

## 2019-10-15 DIAGNOSIS — Z515 Encounter for palliative care: Secondary | ICD-10-CM | POA: Diagnosis present

## 2019-10-15 DIAGNOSIS — F05 Delirium due to known physiological condition: Secondary | ICD-10-CM | POA: Diagnosis present

## 2019-10-15 DIAGNOSIS — Z820 Family history of epilepsy and other diseases of the nervous system: Secondary | ICD-10-CM | POA: Diagnosis not present

## 2019-10-15 DIAGNOSIS — Z9181 History of falling: Secondary | ICD-10-CM | POA: Diagnosis not present

## 2019-10-15 DIAGNOSIS — F0281 Dementia in other diseases classified elsewhere with behavioral disturbance: Secondary | ICD-10-CM | POA: Diagnosis present

## 2019-10-15 DIAGNOSIS — Z20822 Contact with and (suspected) exposure to covid-19: Secondary | ICD-10-CM | POA: Diagnosis present

## 2019-10-15 DIAGNOSIS — Y92009 Unspecified place in unspecified non-institutional (private) residence as the place of occurrence of the external cause: Secondary | ICD-10-CM | POA: Diagnosis not present

## 2019-10-15 DIAGNOSIS — R296 Repeated falls: Secondary | ICD-10-CM | POA: Diagnosis present

## 2019-10-15 DIAGNOSIS — I3139 Other pericardial effusion (noninflammatory): Secondary | ICD-10-CM | POA: Diagnosis present

## 2019-10-15 DIAGNOSIS — Z79899 Other long term (current) drug therapy: Secondary | ICD-10-CM | POA: Diagnosis not present

## 2019-10-15 DIAGNOSIS — W19XXXA Unspecified fall, initial encounter: Secondary | ICD-10-CM | POA: Diagnosis present

## 2019-10-15 DIAGNOSIS — J8409 Other alveolar and parieto-alveolar conditions: Secondary | ICD-10-CM | POA: Diagnosis present

## 2019-10-15 DIAGNOSIS — Z7982 Long term (current) use of aspirin: Secondary | ICD-10-CM | POA: Diagnosis not present

## 2019-10-15 DIAGNOSIS — K219 Gastro-esophageal reflux disease without esophagitis: Secondary | ICD-10-CM | POA: Diagnosis present

## 2019-10-15 DIAGNOSIS — M4854XA Collapsed vertebra, not elsewhere classified, thoracic region, initial encounter for fracture: Secondary | ICD-10-CM | POA: Diagnosis present

## 2019-10-15 DIAGNOSIS — Z66 Do not resuscitate: Secondary | ICD-10-CM | POA: Diagnosis present

## 2019-10-15 DIAGNOSIS — E785 Hyperlipidemia, unspecified: Secondary | ICD-10-CM | POA: Diagnosis present

## 2019-10-15 DIAGNOSIS — M1990 Unspecified osteoarthritis, unspecified site: Secondary | ICD-10-CM | POA: Diagnosis present

## 2019-10-15 LAB — URINALYSIS, ROUTINE W REFLEX MICROSCOPIC
Bilirubin Urine: NEGATIVE
Glucose, UA: NEGATIVE mg/dL
Hgb urine dipstick: NEGATIVE
Ketones, ur: 20 mg/dL — AB
Leukocytes,Ua: NEGATIVE
Nitrite: NEGATIVE
Protein, ur: NEGATIVE mg/dL
Specific Gravity, Urine: 1.02 (ref 1.005–1.030)
pH: 7 (ref 5.0–8.0)

## 2019-10-15 LAB — BASIC METABOLIC PANEL
Anion gap: 8 (ref 5–15)
BUN: 16 mg/dL (ref 8–23)
CO2: 22 mmol/L (ref 22–32)
Calcium: 8.1 mg/dL — ABNORMAL LOW (ref 8.9–10.3)
Chloride: 108 mmol/L (ref 98–111)
Creatinine, Ser: 0.74 mg/dL (ref 0.61–1.24)
GFR calc Af Amer: >60 mL/min (ref >60)
GFR calc non Af Amer: >60 mL/min (ref >60)
Glucose, Bld: 88 mg/dL (ref 70–99)
Potassium: 3.9 mmol/L (ref 3.5–5.1)
Sodium: 138 mmol/L (ref 135–145)

## 2019-10-15 LAB — CBC
HCT: 37.9 % — ABNORMAL LOW (ref 39.0–52.0)
Hemoglobin: 12.5 g/dL — ABNORMAL LOW (ref 13.0–17.0)
MCH: 30.8 pg (ref 26.0–34.0)
MCHC: 33 g/dL (ref 30.0–36.0)
MCV: 93.3 fL (ref 80.0–100.0)
Platelets: 126 10*3/uL — ABNORMAL LOW (ref 150–400)
RBC: 4.06 MIL/uL — ABNORMAL LOW (ref 4.22–5.81)
RDW: 13 % (ref 11.5–15.5)
WBC: 9 10*3/uL (ref 4.0–10.5)
nRBC: 0 % (ref 0.0–0.2)

## 2019-10-15 LAB — CK: Total CK: 763 U/L — ABNORMAL HIGH (ref 49–397)

## 2019-10-15 LAB — VITAMIN B12: Vitamin B-12: 1046 pg/mL — ABNORMAL HIGH (ref 180–914)

## 2019-10-15 MED ORDER — ERGOCALCIFEROL 200 MCG/ML PO SOLN
50000.0000 [IU] | ORAL | Status: DC
  Administered 2019-10-15: 50000 [IU] via ORAL
  Filled 2019-10-15: qty 6.25

## 2019-10-15 NOTE — Evaluation (Signed)
Physical Therapy Evaluation Patient Details Name: Wesley Pierce MRN: [id number] DOB: April 17, 1931 Today's Date: 10/15/2019   History of Present Illness  Wesley Pierce is a 86 y.o. male with PMH of dementia, Parkison's disease, interstitial lung disease presenting with fall at home and increase in confusion.In the ED he had CT head and neck negative for acute findings and mild T2 endplate compression fracture of undetermined age--patient complained of no back pain.   Clinical Impression  Pt admitted for above. He has deficits in cognition, strength, balance, gait, and overall functional mobility. He required max assist +2 for all mobility today. Pt generally weak, but limited mostly by cognition at this time. He is disoriented, has decreased response to commands, and required frequent cueing throughout the session for hand placement and safety throughout. Per RN, son is able to assist at home but pt's wife was just discharged from the hospital with an UE fracture, so currently recommend SNF at this time to help maximize function and independence prior to discharge home. Pt would benefit from continued skilled acute PT intervention in order to address deficits.    Follow Up Recommendations SNF;Supervision/Assistance - 24 hour    Equipment Recommendations  Other (comment)(TBD next venue)    Recommendations for Other Services       Precautions / Restrictions Precautions Precautions: Fall Restrictions Weight Bearing Restrictions: No      Mobility  Bed Mobility Overal bed mobility: Needs Assistance Bed Mobility: Supine to Sit     Supine to sit: Max assist;+2 for physical assistance;HOB elevated     General bed mobility comments: max assist +2 for BLE management and trunk righting; despite several attempts at cueing, pt did not respond to commands in order to move to EOB  Transfers Overall transfer level: Needs assistance Equipment used: 2 person hand held assist Transfers:  Stand Pivot Transfers   Stand pivot transfers: Max assist;+2 physical assistance       General transfer comment: max assist +2 to pivot to chair; SPT and PT had to help scoot him in standing to recliner as pt had difficulty taking steps on his own   Ambulation/Gait                Stairs            Wheelchair Mobility    Modified Rankin (Stroke Patients Only)       Balance Overall balance assessment: History of Falls;Needs assistance Sitting-balance support: Feet supported;No upper extremity supported Sitting balance-Leahy Scale: Fair Sitting balance - Comments: EOB for donning of gait belt; able to maintain balance without support but would not tolerate challenge   Standing balance support: During functional activity;Bilateral upper extremity supported Standing balance-Leahy Scale: Poor Standing balance comment: reliant on 2 person hand held assist for balance                             Pertinent Vitals/Pain Pain Assessment: Faces Faces Pain Scale: No hurt    Home Living Family/patient expects to be discharged to:: Private residence Living Arrangements: Spouse/significant other;Children Available Help at Discharge: Family Type of Home: House         Home Equipment: None Additional Comments: pt unable to state; RN described living situation after phone call with son; pt lives with son and wife, did not use an AD PTA; son can care for him but wife just leaving hospital following an UE fracture    Prior Function Level  of Independence: Independent               Hand Dominance        Extremity/Trunk Assessment   Upper Extremity Assessment Upper Extremity Assessment: Defer to OT evaluation    Lower Extremity Assessment Lower Extremity Assessment: Generalized weakness       Communication   Communication: Other (comment);Receptive difficulties;Expressive difficulties(pt confused, disoriented, and difficult to understand)   Cognition Arousal/Alertness: Lethargic Behavior During Therapy: Flat affect Overall Cognitive Status: Impaired/Different from baseline Area of Impairment: Orientation;Attention;Memory;Following commands;Safety/judgement;Awareness;Problem solving                 Orientation Level: Disoriented to;Person;Place;Time;Situation Current Attention Level: Alternating Memory: Decreased recall of precautions;Decreased short-term memory Following Commands: Follows one step commands inconsistently;Follows one step commands with increased time Safety/Judgement: Decreased awareness of safety;Decreased awareness of deficits Awareness: Emergent Problem Solving: Slow processing;Decreased initiation;Difficulty sequencing;Requires verbal cues;Requires tactile cues General Comments: pt confused throughout; frequent one-step cueing; slow processing, minimal response to commands and questions      General Comments      Exercises     Assessment/Plan    PT Assessment Patient needs continued PT services  PT Problem List Decreased strength;Decreased activity tolerance;Decreased balance;Decreased mobility;Decreased coordination;Decreased cognition;Decreased knowledge of use of DME;Decreased safety awareness;Decreased knowledge of precautions       PT Treatment Interventions DME instruction;Stair training;Gait training;Functional mobility training;Therapeutic activities;Therapeutic exercise;Balance training;Neuromuscular re-education;Cognitive remediation;Patient/family education;Modalities;Wheelchair mobility training    PT Goals (Current goals can be found in the Care Plan section)  Acute Rehab PT Goals Patient Stated Goal: unable to state PT Goal Formulation: Patient unable to participate in goal setting Time For Goal Achievement: 10/29/19 Potential to Achieve Goals: Fair    Frequency Min 2X/week   Barriers to discharge        Co-evaluation               AM-PAC PT "6 Clicks"  Mobility  Outcome Measure Help needed turning from your back to your side while in a flat bed without using bedrails?: Total Help needed moving from lying on your back to sitting on the side of a flat bed without using bedrails?: Total Help needed moving to and from a bed to a chair (including a wheelchair)?: Total Help needed standing up from a chair using your arms (e.g., wheelchair or bedside chair)?: Total Help needed to walk in hospital room?: Total Help needed climbing 3-5 steps with a railing? : Total 6 Click Score: 6    End of Session Equipment Utilized During Treatment: Gait belt Activity Tolerance: Patient tolerated treatment well;Patient limited by lethargy Patient left: in chair;with call bell/phone within reach;with chair alarm set Nurse Communication: Mobility status PT Visit Diagnosis: Unsteadiness on feet (R26.81);Other abnormalities of gait and mobility (R26.89);Repeated falls (R29.6);Muscle weakness (generalized) (M62.81);History of falling (Z91.81);Difficulty in walking, not elsewhere classified (R26.2)    Time: 1610-9604 PT Time Calculation (min) (ACUTE ONLY): 17 min   Charges:   PT Evaluation $PT Eval Moderate Complexity: 1 Mod          Christel Mormon, SPT   Silvana Newness 10/15/2019, 12:35 PM

## 2019-10-15 NOTE — Progress Notes (Signed)
Physical Therapy Progress Note for Charges    10/15/19 1226  PT Visit Information  Last PT Received On 10/15/19  PT General Charges  $$ ACUTE PT VISIT 1 Visit  PT Evaluation  $PT Eval Moderate Complexity 1 Mod  Ginette Pitman, PT, DPT  Acute Rehabilitation Services Pager [phone] Office [phone]

## 2019-10-15 NOTE — Progress Notes (Signed)
OT Cancellation Note  Patient Details Name: Wesley Pierce MRN: [id number] DOB: 10-Feb-1931   Cancelled Treatment:    Reason Eval/Treat Not Completed: Other (comment)(Pt is now transitioning to hospice care. ). Per palliative medicine note this morning, pt is now transitioning to hospice care. Will sign off. Should OT needs arise please consider reordering OT services.   Raynald Kemp, OT Acute Rehabilitation Services Pager: [phone] Office: [phone]  10/15/2019, 12:26 PM

## 2019-10-15 NOTE — Plan of Care (Signed)

## 2019-10-15 NOTE — Progress Notes (Signed)
UA collected and sent to lab.

## 2019-10-15 NOTE — Progress Notes (Addendum)
[facility]  Pt is our current palliative pt in the community.  Family requested we re-evaluate to determine if hospice appropriate.  Pt and chart under review by [facility] MD and eligibility is pending.  Pt will need ambulance transport home tomorrow once DME has been delivered.  [facility] will order DME today, anticipate it will be delivered later this evening, plan to d/c home in am.   Wallis Bamberg RN, BSN, CCRN [facility] Liaison (in [place]) [phone]  **1pm, approved for hospice at home per Dr. Barbee Shropshire with [facility]

## 2019-10-15 NOTE — Progress Notes (Signed)
SLP Cancellation Note  Patient Details Name: Wesley Pierce MRN: [id number] DOB: [birth year]   Cancelled treatment:       Reason Eval/Treat Not Completed: Other (comment)  Per Palliative medicine and hospice notes from earlier this date, plan for patient to transition to hospice services. ST to sign off.  Please re-order speech therapy should needs arise.   Shella Spearing, M.Ed., CCC-SLP Speech Therapy Acute Rehabilitation [phone]  Shella Spearing 10/15/2019, 5:37 PM

## 2019-10-15 NOTE — Progress Notes (Signed)
Palliative Medicine RN Note: Consult order noted; [name] was referred to [place] because he was followed by the palliative arm of [facility] ([facility]; [facility]   I spoke with the [facility] liaison Wallis Bamberg. They have reached out to the family, and they would like to transition [name] to hospice. They will need to get DME to the house, and she will start working on this now. I contacted Dr. Benjamine Mola, and she agrees that this is the right plan for [name]. I also contacted the [facility], who confirmed there is a gold DNR on the chart.  At this time, there is no need for further PMT involvement, so we will sign off. If new needs or concerns arise, please call our office.  Wesley Chance Lil Lepage, RN, BSN, [facility] Palliative Medicine Team 10/15/2019 10:47 AM Office [phone]

## 2019-10-15 NOTE — Progress Notes (Signed)
[facility] PROGRESS NOTE  Wesley Pierce CZY:[id number] DOB: 1931/03/05 DOA: 10/14/2019 PCP: Burnard Bunting, MD  Assessment/Plan:  #1. Rhabdomyolysis.  Patient with a fall on January 11 in the morning was on the floor for approximately 6 hours.  Afterwards noted to be more lethargic.  EMS was called by the family.  Work-up revealed CK 1500.  He was provided with IV fluids.  This morning his CK is 750.  Urine output unknown.  Creatinine within the limits of normal. -Continue IV fluids at a slower rate -Monitor  #2.  Delirium superimposed on dementia in the setting of Parkinson's disease.  Likely progression of disease/decline.  Chart review indicates last couple of weeks patient with worsening dementia/wandering behaviors.  Patient will not follow simple commands.  He becomes combative while care is being delivered.  CT of the head no evidence of acute intracranial abnormality.  C-spine did reveal a mild T2 compression fracture that is new since 2017 but otherwise age indeterminant.  No evidence of acute fractures.  No signs of infection.  No metabolic derangement.  B12 level is elevated. -IV fluids as noted above -Sedating medications as able  #3.  Elevated D-dimer.  Likely related to #1.  CT a of the chest negative for PE.  Procardia.  No hypoxia.  #4.  Large pericardial effusion per CT angio of the chest.  EKG with significant artifact.  We will hold off on repeating at this point.  Patient under the care of palliative medicine in the outpatient setting.  With decline over the last 3 weeks family requesting transition to hospice care.  Feel like this decisions appropriate -Likely discharge to home with hospice tomorrow.  #5.  Restless leg syndrome. -Continue home meds as able  Code Status: dnr Family Communication: message left for son Disposition Plan: home with hospice likely tomorrow   Consultants:    Procedures:    Antibiotics:    HPI/Subjective:  awake but  non-communicative. Combative/resistant to care.   Objective: Vitals:   10/15/19 0756 10/15/19 0817  BP:  114/86  Pulse:  (!) 53  Resp:  16  Temp:  98.2 F (36.8 C)  SpO2: 97% 97%    Intake/Output Summary (Last 24 hours) at 10/15/2019 1224 Last data filed at 10/15/2019 0932 Gross per 24 hour  Intake 1353.11 ml  Output --  Net 1353.11 ml   There were no vitals filed for this visit.  Exam:   General:  Thin cachectic, pale awake but no engaging  Cardiovascular: rrr no mgr no LE edema  Respiratory: normal effort BS clear bilaterally no wheeze  Abdomen: non-distended +BS non-tender no guarding  Musculoskeletal: joints without swelling/erythema  Data Reviewed: Basic Metabolic Panel: Recent Labs  Lab 10/14/19 1430 10/14/19 2000 10/15/19 0947  NA 141  --  138  K 4.4  --  3.9  CL 106  --  108  CO2 26  --  22  GLUCOSE 105*  --  88  BUN 18  --  16  CREATININE 0.70 0.79 0.74  CALCIUM 8.8*  --  8.1*   Liver Function Tests: No results for input(s): AST, ALT, ALKPHOS, BILITOT, PROT, ALBUMIN in the last 168 hours. No results for input(s): LIPASE, AMYLASE in the last 168 hours. No results for input(s): AMMONIA in the last 168 hours. CBC: Recent Labs  Lab 10/14/19 1430 10/14/19 2000 10/15/19 0947  WBC 10.0 11.4* 9.0  NEUTROABS 8.1*  --   --   HGB 12.9* 13.6 12.5*  HCT  39.6 40.7 37.9*  MCV 94.7 92.7 93.3  PLT 150 148* 126*   Cardiac Enzymes: Recent Labs  Lab 10/14/19 1430 10/15/19 0947  CKTOTAL 1,596* 763*   BNP (last 3 results) No results for input(s): BNP in the last 8760 hours.  ProBNP (last 3 results) No results for input(s): PROBNP in the last 8760 hours.  CBG: No results for input(s): GLUCAP in the last 168 hours.  Recent Results (from the past 240 hour(s))  SARS CORONAVIRUS 2 (TAT 6-24 HRS) Nasopharyngeal Nasopharyngeal Swab     Status: None   Collection Time: 10/14/19  3:29 PM   Specimen: Nasopharyngeal Swab  Result Value Ref Range Status    SARS Coronavirus 2 NEGATIVE NEGATIVE Final    Comment: (NOTE) SARS-CoV-2 target nucleic acids are NOT DETECTED. The SARS-CoV-2 RNA is generally detectable in upper and lower respiratory specimens during the acute phase of infection. Negative results do not preclude SARS-CoV-2 infection, do not rule out co-infections with other pathogens, and should not be used as the sole basis for treatment or other patient management decisions. Negative results must be combined with clinical observations, patient history, and epidemiological information. The expected result is Negative. Fact Sheet for Patients: HairSlick.nohttps://www.fda.gov/media/138098/download Fact Sheet for Healthcare Providers: quierodirigir.comhttps://www.fda.gov/media/138095/download This test is not yet approved or cleared by the [place] FDA and  has been authorized for detection and/or diagnosis of SARS-CoV-2 by FDA under an Emergency Use Authorization (EUA). This EUA will remain  in effect (meaning this test can be used) for the duration of the COVID-19 declaration under Section 56 4(b)(1) of the Act, 21 U.S.C. section 360bbb-3(b)(1), unless the authorization is terminated or revoked sooner. Performed at [facility], [address] [place], [place] [address]      Studies: DG Shoulder Right  Result Date: 10/14/2019 CLINICAL DATA:  Trauma secondary to a fall. EXAM: RIGHT SHOULDER - 2+ VIEW COMPARISON:  None. FINDINGS: There is no fracture or dislocation. Osteopenia. Minimal osteophyte formation on the inferior rim of the glenoid. Possible calcification in distal rotator cuff. IMPRESSION: No acute abnormality. Minimal degenerative changes. Possible calcific tendinopathy of the rotator cuff. Electronically Signed   By: Francene BoyersJames  Maxwell M.D.   On: 10/14/2019 15:36   CT Head Wo Contrast  Result Date: 10/14/2019 CLINICAL DATA:  Headache, posttraumatic. Poly trauma, critical, head/cervical spine injury suspected. Additional history provided:  Patient son reports that he fell yesterday. EXAM: CT HEAD WITHOUT CONTRAST CT CERVICAL SPINE WITHOUT CONTRAST TECHNIQUE: Multidetector CT imaging of the head and cervical spine was performed following the standard protocol without intravenous contrast. Multiplanar CT image reconstructions of the cervical spine were also generated. COMPARISON:  CT head/cervical spine 02/29/2016 FINDINGS: CT HEAD FINDINGS Brain: No evidence of acute intracranial hemorrhage. No demarcated cortical infarction. No evidence of intracranial mass. No midline shift or extra-axial fluid collection. Ill-defined hypoattenuation within the cerebral white matter and pons, progressed from prior examination and nonspecific, but consistent with chronic small vessel ischemic disease. Moderate generalized parenchymal atrophy. Vascular: No hyperdense vessel.  Atherosclerotic calcifications. Skull: Normal. Negative for fracture or focal lesion. Sinuses/Orbits: Visualized orbits demonstrate no acute abnormality. No significant paranasal sinus disease or mastoid effusion at the imaged levels. Other: Probable small left frontal forehead/periorbital hematoma. CT CERVICAL SPINE FINDINGS Alignment: Straightening of the expected cervical lordosis. No significant spondylolisthesis. Skull base and vertebrae: The basion-dental and atlanto-dental intervals are maintained.No evidence of acute fracture to the cervical spine. A mild T2 superior endplate compression deformity is new as compared to CT  02/28/2016, but otherwise age indeterminate. Soft tissues and spinal canal: No prevertebral fluid or swelling. No visible canal hematoma. Bilateral thyroid lobe nodules. The largest nodule within the right lobe measures 2.1 cm (series 5, image 77). Disc levels: Cervical spondylosis with multilevel disc height loss, posterior disc osteophytes, uncovertebral and facet hypertrophy. Severe disc height loss with degenerative fusion across the C5-C6 disc space. There is also  degenerative fusion across the posterior disc space at C4-C5 and possibly at C6-C7. Degenerative fusion of the facet joints on the left at C3-C4. Upper chest: No consolidation within the imaged lung apices. No visible pneumothorax. IMPRESSION: CT head: 1. No evidence of acute intracranial abnormality. 2. Chronic small vessel ischemic disease, progressed as compared to prior examination 02/28/2016. 3. Moderate generalized parenchymal atrophy. CT cervical spine: 1. A mild T2 vertebral body superior endplate compression fracture is new as compared to prior CT 02/28/2016, but otherwise age indeterminate. 2. No evidence of acute fracture to the cervical spine. 3. Cervical spondylosis with sites of degenerative fusion as described. 4. Thyroid nodules, the largest measuring 2.1 cm within the right lobe. Nonemergent thyroid ultrasound is recommended for further evaluation. Electronically Signed   By: Jackey Loge DO   On: 10/14/2019 12:57   CT ANGIO CHEST PE W OR WO CONTRAST  Result Date: 10/14/2019 CLINICAL DATA:  86 year old male with elevated D-dimer. EXAM: CT ANGIOGRAPHY CHEST WITH CONTRAST TECHNIQUE: Multidetector CT imaging of the chest was performed using the standard protocol during bolus administration of intravenous contrast. Multiplanar CT image reconstructions and MIPs were obtained to evaluate the vascular anatomy. CONTRAST:  10mL OMNIPAQUE IOHEXOL 350 MG/ML SOLN COMPARISON:  Chest radiograph dated 02/29/2016. FINDINGS: Evaluation is limited due to streak artifact caused by patient's arms. Cardiovascular: There is mild to moderate cardiomegaly. Large pericardial effusion measuring up to 18 mm anterior to the heart. Clinical correlation and further evaluation with echocardiogram recommended. There is retrograde flow of contrast from the right atrium into the IVC keeping with right heart dysfunction. Mild atherosclerotic calcification of the thoracic aorta. No aneurysmal dilatation or dissection. Evaluation  of the pulmonary arteries is limited due to suboptimal opacification of the peripheral branches. Apparent faint low attenuation within the left upper lobe pulmonary artery branches (series 7 images 168, 160, 155) may be artifactual and related to suboptimal opacification. Nonocclusive thrombus or scarring related to prior PE is not excluded. V/Q scan may provide better evaluation if there is high clinical concern for acute PE. Mediastinum/Nodes: Top-normal bilateral hilar lymph nodes measure up to 10 mm in short axis. The esophagus is grossly unremarkable. Probable faint bilateral thyroid hypodense nodule put Lungs/Pleura: There is diffuse coarse calcification of the right lower lobe pleural surface which may be related to prior empyema or pleurodesis or sequela of prior asbestos exposure. Areas of calcified pleural plaques are so noted in the left upper lobe anteriorly. There are bibasilar subpleural scarring with reticulation. Areas of confluent and streaky densities in the right lower lobe may represent superimposed pneumonia. Clinical correlation is recommended. Trace pleural effusion in the left fissure versus pleural thickening. No large pleural effusion or pneumothorax. The central airways are patent. Upper Abdomen: No acute abnormality. Musculoskeletal: Degenerative changes of the spine. No acute osseous pathology. Review of the MIP images confirms the above findings. IMPRESSION: 1. No large central pulmonary artery embolus. Areas of low density within the segmental pulmonary arteries primarily involving the left upper lobe may be related to suboptimal opacification or represent nonocclusive thrombus or scarring related to old  PE's. V/Q scan may provide better evaluation if there is high clinical concern for acute PE. 2. Patchy and streaky right lung base density may represent pneumonia superimposed on background of chronic changes and scarring. 3. Cardiomegaly with a large pericardial effusion. Further  evaluation with echocardiogram recommended. 4. Calcified pleural plaques primarily involving the right lung may be sequela of prior asbestos exposure or empyema. Electronically Signed   By: Elgie Collard M.D.   On: 10/14/2019 22:45   CT Cervical Spine Wo Contrast  Result Date: 10/14/2019 CLINICAL DATA:  Headache, posttraumatic. Poly trauma, critical, head/cervical spine injury suspected. Additional history provided: Patient son reports that he fell yesterday. EXAM: CT HEAD WITHOUT CONTRAST CT CERVICAL SPINE WITHOUT CONTRAST TECHNIQUE: Multidetector CT imaging of the head and cervical spine was performed following the standard protocol without intravenous contrast. Multiplanar CT image reconstructions of the cervical spine were also generated. COMPARISON:  CT head/cervical spine 02/29/2016 FINDINGS: CT HEAD FINDINGS Brain: No evidence of acute intracranial hemorrhage. No demarcated cortical infarction. No evidence of intracranial mass. No midline shift or extra-axial fluid collection. Ill-defined hypoattenuation within the cerebral white matter and pons, progressed from prior examination and nonspecific, but consistent with chronic small vessel ischemic disease. Moderate generalized parenchymal atrophy. Vascular: No hyperdense vessel.  Atherosclerotic calcifications. Skull: Normal. Negative for fracture or focal lesion. Sinuses/Orbits: Visualized orbits demonstrate no acute abnormality. No significant paranasal sinus disease or mastoid effusion at the imaged levels. Other: Probable small left frontal forehead/periorbital hematoma. CT CERVICAL SPINE FINDINGS Alignment: Straightening of the expected cervical lordosis. No significant spondylolisthesis. Skull base and vertebrae: The basion-dental and atlanto-dental intervals are maintained.No evidence of acute fracture to the cervical spine. A mild T2 superior endplate compression deformity is new as compared to CT 02/28/2016, but otherwise age indeterminate. Soft  tissues and spinal canal: No prevertebral fluid or swelling. No visible canal hematoma. Bilateral thyroid lobe nodules. The largest nodule within the right lobe measures 2.1 cm (series 5, image 77). Disc levels: Cervical spondylosis with multilevel disc height loss, posterior disc osteophytes, uncovertebral and facet hypertrophy. Severe disc height loss with degenerative fusion across the C5-C6 disc space. There is also degenerative fusion across the posterior disc space at C4-C5 and possibly at C6-C7. Degenerative fusion of the facet joints on the left at C3-C4. Upper chest: No consolidation within the imaged lung apices. No visible pneumothorax. IMPRESSION: CT head: 1. No evidence of acute intracranial abnormality. 2. Chronic small vessel ischemic disease, progressed as compared to prior examination 02/28/2016. 3. Moderate generalized parenchymal atrophy. CT cervical spine: 1. A mild T2 vertebral body superior endplate compression fracture is new as compared to prior CT 02/28/2016, but otherwise age indeterminate. 2. No evidence of acute fracture to the cervical spine. 3. Cervical spondylosis with sites of degenerative fusion as described. 4. Thyroid nodules, the largest measuring 2.1 cm within the right lobe. Nonemergent thyroid ultrasound is recommended for further evaluation. Electronically Signed   By: Jackey Loge DO   On: 10/14/2019 12:57   VAS [place] LOWER EXTREMITY VENOUS (DVT)  Result Date: 10/15/2019  Lower Venous Study Indications: Elevated d-dimer.  Comparison Study: No prior study. Performing Technologist: Gertie Fey MHA, RDMS, RVT, RDCS  Examination Guidelines: A complete evaluation includes B-mode imaging, spectral Doppler, color Doppler, and power Doppler as needed of all accessible portions of each vessel. Bilateral testing is considered an integral part of a complete examination. Limited examinations for reoccurring indications may be performed as noted.   +---------+---------------+---------+-----------+----------+--------------+ RIGHT    CompressibilityPhasicitySpontaneityPropertiesThrombus Aging +---------+---------------+---------+-----------+----------+--------------+  CFV      Full           Yes      Yes                                 +---------+---------------+---------+-----------+----------+--------------+ SFJ      Full                                                        +---------+---------------+---------+-----------+----------+--------------+ FV Prox  Full                                                        +---------+---------------+---------+-----------+----------+--------------+ FV Mid   Full                                                        +---------+---------------+---------+-----------+----------+--------------+ FV DistalFull                                                        +---------+---------------+---------+-----------+----------+--------------+ PFV      Full                                                        +---------+---------------+---------+-----------+----------+--------------+ POP      Full           Yes      Yes                                 +---------+---------------+---------+-----------+----------+--------------+ PTV      Full                                                        +---------+---------------+---------+-----------+----------+--------------+ PERO     Full                                                        +---------+---------------+---------+-----------+----------+--------------+   +---------+---------------+---------+-----------+----------+-----------------+ LEFT     CompressibilityPhasicitySpontaneityPropertiesThrombus Aging    +---------+---------------+---------+-----------+----------+-----------------+ CFV      Full           Yes      Yes                                     +---------+---------------+---------+-----------+----------+-----------------+  SFJ      Full                                                           +---------+---------------+---------+-----------+----------+-----------------+ FV Prox  Full                                                           +---------+---------------+---------+-----------+----------+-----------------+ FV Mid   Full                                                           +---------+---------------+---------+-----------+----------+-----------------+ FV DistalFull                                                           +---------+---------------+---------+-----------+----------+-----------------+ PFV      Full                                                           +---------+---------------+---------+-----------+----------+-----------------+ POP      Full           Yes      Yes                                    +---------+---------------+---------+-----------+----------+-----------------+ PTV      None                    No                   Age Indeterminate +---------+---------------+---------+-----------+----------+-----------------+ PERO     Full                                                           +---------+---------------+---------+-----------+----------+-----------------+     Summary: Right: There is no evidence of deep vein thrombosis in the lower extremity. No cystic structure found in the popliteal fossa. Left: Findings consistent with age indeterminate deep vein thrombosis involving the left posterior tibial veins. No cystic structure found in the popliteal fossa.  *See table(s) above for measurements and observations.    Preliminary     Scheduled Meds: . acetaminophen  1,000 mg Oral QPM  . arformoterol  15 mcg Nebulization BID  . aspirin EC  81 mg Oral Daily  . carbidopa-levodopa  1 tablet Oral TID  . enoxaparin (LOVENOX) injection  40 mg  Subcutaneous  Q24H  . Melatonin  9 mg Oral BID  . memantine  10 mg Oral BID  . pantoprazole  40 mg Oral Daily  . QUEtiapine  150 mg Oral QHS  . QUEtiapine  25 mg Oral Daily  . rOPINIRole  2 mg Oral QHS  . vitamin B-12  1,000 mcg Oral Daily  . Vitamin D (Ergocalciferol)  50,000 Units Oral Weekly  . vortioxetine HBr  10 mg Oral q morning - 10a   Continuous Infusions: . sodium chloride 50 mL/hr at 10/15/19 1215    Principal Problem:   Rhabdomyolysis Active Problems:   Fall   Compression fracture of thoracic vertebra (HCC)   Delirium superimposed on dementia   Pericardial effusion   Alveolar pneumopathy (HCC)   GERD   Parkinson's disease dementia (HCC)   Dementia due to Parkinson's disease with behavioral disturbance (HCC)   RLS (restless legs syndrome)   B12 deficiency    Time spent: 45 minutes    Scnetx M NP  [facility]  If 7PM-7AM, please contact night-coverage at [email or web address], password [facility] 10/15/2019, 12:24 PM  LOS: 0 days

## 2019-10-15 NOTE — Progress Notes (Signed)
NCM spoke with pt's son Viviann Spare and confirmed d/c plan, home with hospice care, [employer] engineer, contracting. Viviann Spare stated [facility] has arranged for DME  ( hospital bed ) to be delivered to pt's home some time today.   [facility])    [phone]      Pt will need PTAR transport to home. Family will be ready to receive on tomorrow. NCM will continue to monitor for  TOC needs... Gae Gallop RN,BSN,CM [phone]

## 2019-10-15 NOTE — Progress Notes (Signed)
Civil engineer, contracting  Notified by PMT that Wesley Pierce is admitted.  He was recently evaluated for hospice and deemed not eligible at that time (three weeks ago).  Pt may now be appropriate and PMT will update [facility] to follow up if he is.  He is our current community based palliative pt.  Wallis Bamberg RN, BSN, CCRN [facility] Liaison (in [place]) [phone]

## 2019-10-16 DIAGNOSIS — T796XXD Traumatic ischemia of muscle, subsequent encounter: Secondary | ICD-10-CM

## 2019-10-16 DIAGNOSIS — K219 Gastro-esophageal reflux disease without esophagitis: Secondary | ICD-10-CM

## 2019-10-16 NOTE — Discharge Summary (Addendum)
Physician Discharge Summary [facility]    Patient: Wesley Pierce                   Admit date: 10/14/2019   DOB: 1931/05/31             Discharge date:10/16/2019/9:16 AM [id number]                          PCP: Geoffry ParadiseAronson, Richard, MD  Disposition: Home with hospice  Recommendations for Outpatient Follow-up:   . Follow up: Hospice at home  Discharge Condition: Stable   Code Status:   Code Status: DNR  Diet recommendation: Regular healthy diet as tolerated   Discharge Diagnoses:    Principal Problem:   Rhabdomyolysis Active Problems:   Alveolar pneumopathy (HCC)   GERD   RLS (restless legs syndrome)   B12 deficiency   Parkinson's disease dementia (HCC)   Dementia due to Parkinson's disease with behavioral disturbance (HCC)   Fall   Compression fracture of thoracic vertebra (HCC)   Delirium superimposed on dementia   Pericardial effusion   History of Present Illness/ Hospital Course Charline Bills/Brief Summary:    HPI: Wesley Pierce is a 86 y.o. male with PMH of dementia, Parkison's disease, interstitial lung disease presenting with fall at home and increase in confusion. He lives with his son who is his caregiver. He had the fall yesterday at 10am and by 6pm was noted to be more lethargic. He slept on the floor at night due to not being able to move up. He has abrasion to the right side of his face. His family called EMS today. VS reported as stable, patient afebrile.   In the ED he had CT head and neck negative for acute findings and mild T2 endplate compression fracture of undetermined age--patient complained of no back pain. His labwork was significant for CK of 1,596 but normal Scr. COVID 19 was ordered but it is pending. D-dimer was elevated to 6.98.    Rhabdomyolysis.  Patient with a fall on January 11 in the morning was on the floor for approximately 6 hours.  Afterwards noted to be more lethargic.  EMS was called by the family.  Work-up revealed CK 1500.   He was provided with IV fluids.  This morning his CK is 750.  Urine output unknown.  Creatinine within the limits of normal. -Continue IV fluids at a slower rate -Monitor    Delirium superimposed on dementia in the setting of Parkinson's disease.  Likely progression of disease/decline.  Chart review indicates last couple of weeks patient with worsening dementia/wandering behaviors.  Patient will not follow simple commands.  He becomes combative while care is being delivered.  CT of the head no evidence of acute intracranial abnormality.  C-spine did reveal a mild T2 compression fracture that is new since 2017 but otherwise age indeterminant.  No evidence of acute fractures.  No signs of infection.  No metabolic derangement.  Status post IV fluid resuscitation   Elevated D-dimer.  Likely related to #1.  CT a of the chest negative for PE.  Procardia.  No hypoxia.  Large pericardial effusion per CT angio of the chest.  EKG with significant artifact.  We will hold off on repeating at this point.  Patient under the care of palliative medicine in the outpatient setting.  With decline over the last 3 weeks family requesting transition to hospice care.   Feel like this decisions appropriate  Restless leg syndrome. -Continue home meds as able  Code Status:  DNR/DNI Family Communication: message left for son -early spoke to hospice Agreed for patient to be returned back home with hospice care Disposition Plan:  Will be discharged home with hospice  Nutritional status:          Discharge Instructions:   Discharge Instructions    Activity as tolerated - No restrictions   Complete by: As directed    Diet - low sodium heart healthy   Complete by: As directed    Discharge instructions   Complete by: As directed    Continue hospice care and comfort care measures   Increase activity slowly   Complete by: As directed        Medication List    TAKE these medications   acetaminophen 500 MG  tablet Commonly known as: TYLENOL Take 1,000 mg by mouth daily. Every evening   albuterol 108 (90 Base) MCG/ACT inhaler Commonly known as: ProAir HFA Inhale 2 puffs into the lungs every 6 (six) hours as needed for wheezing or shortness of breath.   aspirin 81 MG tablet Take 81 mg by mouth daily.   carbidopa-levodopa 25-100 MG tablet Commonly known as: SINEMET IR TAKE 1 TABLET BY MOUTH 3 TIMES A DAY.   ergocalciferol 1.25 MG (50000 UT) capsule Commonly known as: VITAMIN D2 Take 50,000 Units by mouth once a week.   Flonase 50 MCG/ACT nasal spray Generic drug: fluticasone 2 sprays by Nasal route daily.   glucosamine-chondroitin 500-400 MG tablet Take 1 tablet by mouth daily.   guaiFENesin 600 MG 12 hr tablet Commonly known as: MUCINEX Take 600 mg by mouth as needed.   MELATONIN PO Take 10 mg by mouth 2 (two) times daily. 1600 and 2000   memantine 10 MG tablet Commonly known as: NAMENDA TAKE 1 TABLET BY MOUTH TWICE DAILY.   OVER THE COUNTER MEDICATION Take 1-2 capsules by mouth See admin instructions. One tab after dinner, 2 at bedtime (CBD )   pantoprazole 40 MG tablet Commonly known as: PROTONIX Take 40 mg by mouth daily.   QUEtiapine 25 MG tablet Commonly known as: SEROQUEL TAKE ONE TABLET IN THE AFTERNOON. What changed: See the new instructions.   QUEtiapine 50 MG tablet Commonly known as: SEROQUEL TAKE 3 TABLETS AT BEDTIME. What changed:   how much to take  when to take this  additional instructions   rOPINIRole 1 MG tablet Commonly known as: REQUIP TAKE 1 & 1/2 TABLETS AT BEDTIME. What changed:   how much to take  how to take this  when to take this  additional instructions   salmeterol 50 MCG/DOSE diskus inhaler Commonly known as: Serevent Diskus Inhale 1 puff into the lungs 2 (two) times daily.   vitamin B-12 1000 MCG tablet Commonly known as: CYANOCOBALAMIN Take 1,000 mcg by mouth daily.   vortioxetine HBr 10 MG Tabs  tablet Commonly known as: Trintellix Take 1 tablet (10 mg total) by mouth every morning.       Allergies  Allergen Reactions  . Advair Diskus [Fluticasone-Salmeterol]     anxiety  . Aloe Swelling  . Ambien [Zolpidem Tartrate] Other (See Comments)    Sleep walking  . Halcion [Triazolam]     Sleep Walking  . Horizant [Gabapentin]     Sleep walking and halucinations  . Lyrica [Pregabalin] Other (See Comments)    Sleep walking   . Sulfonamide Derivatives     Pt unable to remember  . Ciprofloxacin  Rash     Procedures /Studies:   DG Shoulder Right  Result Date: 10/14/2019 CLINICAL DATA:  Trauma secondary to a fall. EXAM: RIGHT SHOULDER - 2+ VIEW COMPARISON:  None. FINDINGS: There is no fracture or dislocation. Osteopenia. Minimal osteophyte formation on the inferior rim of the glenoid. Possible calcification in distal rotator cuff. IMPRESSION: No acute abnormality. Minimal degenerative changes. Possible calcific tendinopathy of the rotator cuff. Electronically Signed   By: Francene BoyersJames  Maxwell M.D.   On: 10/14/2019 15:36   CT Head Wo Contrast  Result Date: 10/14/2019 CLINICAL DATA:  Headache, posttraumatic. Poly trauma, critical, head/cervical spine injury suspected. Additional history provided: Patient son reports that he fell yesterday. EXAM: CT HEAD WITHOUT CONTRAST CT CERVICAL SPINE WITHOUT CONTRAST TECHNIQUE: Multidetector CT imaging of the head and cervical spine was performed following the standard protocol without intravenous contrast. Multiplanar CT image reconstructions of the cervical spine were also generated. COMPARISON:  CT head/cervical spine 02/29/2016 FINDINGS: CT HEAD FINDINGS Brain: No evidence of acute intracranial hemorrhage. No demarcated cortical infarction. No evidence of intracranial mass. No midline shift or extra-axial fluid collection. Ill-defined hypoattenuation within the cerebral white matter and pons, progressed from prior examination and nonspecific, but  consistent with chronic small vessel ischemic disease. Moderate generalized parenchymal atrophy. Vascular: No hyperdense vessel.  Atherosclerotic calcifications. Skull: Normal. Negative for fracture or focal lesion. Sinuses/Orbits: Visualized orbits demonstrate no acute abnormality. No significant paranasal sinus disease or mastoid effusion at the imaged levels. Other: Probable small left frontal forehead/periorbital hematoma. CT CERVICAL SPINE FINDINGS Alignment: Straightening of the expected cervical lordosis. No significant spondylolisthesis. Skull base and vertebrae: The basion-dental and atlanto-dental intervals are maintained.No evidence of acute fracture to the cervical spine. A mild T2 superior endplate compression deformity is new as compared to CT 02/28/2016, but otherwise age indeterminate. Soft tissues and spinal canal: No prevertebral fluid or swelling. No visible canal hematoma. Bilateral thyroid lobe nodules. The largest nodule within the right lobe measures 2.1 cm (series 5, image 77). Disc levels: Cervical spondylosis with multilevel disc height loss, posterior disc osteophytes, uncovertebral and facet hypertrophy. Severe disc height loss with degenerative fusion across the C5-C6 disc space. There is also degenerative fusion across the posterior disc space at C4-C5 and possibly at C6-C7. Degenerative fusion of the facet joints on the left at C3-C4. Upper chest: No consolidation within the imaged lung apices. No visible pneumothorax. IMPRESSION: CT head: 1. No evidence of acute intracranial abnormality. 2. Chronic small vessel ischemic disease, progressed as compared to prior examination 02/28/2016. 3. Moderate generalized parenchymal atrophy. CT cervical spine: 1. A mild T2 vertebral body superior endplate compression fracture is new as compared to prior CT 02/28/2016, but otherwise age indeterminate. 2. No evidence of acute fracture to the cervical spine. 3. Cervical spondylosis with sites of  degenerative fusion as described. 4. Thyroid nodules, the largest measuring 2.1 cm within the right lobe. Nonemergent thyroid ultrasound is recommended for further evaluation. Electronically Signed   By: Jackey LogeKyle  Golden DO   On: 10/14/2019 12:57   CT ANGIO CHEST PE W OR WO CONTRAST  Result Date: 10/14/2019 CLINICAL DATA:  86 year old male with elevated D-dimer. EXAM: CT ANGIOGRAPHY CHEST WITH CONTRAST TECHNIQUE: Multidetector CT imaging of the chest was performed using the standard protocol during bolus administration of intravenous contrast. Multiplanar CT image reconstructions and MIPs were obtained to evaluate the vascular anatomy. CONTRAST:  75mL OMNIPAQUE IOHEXOL 350 MG/ML SOLN COMPARISON:  Chest radiograph dated 02/29/2016. FINDINGS: Evaluation is limited due to streak artifact caused  by patient's arms. Cardiovascular: There is mild to moderate cardiomegaly. Large pericardial effusion measuring up to 18 mm anterior to the heart. Clinical correlation and further evaluation with echocardiogram recommended. There is retrograde flow of contrast from the right atrium into the IVC keeping with right heart dysfunction. Mild atherosclerotic calcification of the thoracic aorta. No aneurysmal dilatation or dissection. Evaluation of the pulmonary arteries is limited due to suboptimal opacification of the peripheral branches. Apparent faint low attenuation within the left upper lobe pulmonary artery branches (series 7 images 168, 160, 155) may be artifactual and related to suboptimal opacification. Nonocclusive thrombus or scarring related to prior PE is not excluded. V/Q scan may provide better evaluation if there is high clinical concern for acute PE. Mediastinum/Nodes: Top-normal bilateral hilar lymph nodes measure up to 10 mm in short axis. The esophagus is grossly unremarkable. Probable faint bilateral thyroid hypodense nodule put Lungs/Pleura: There is diffuse coarse calcification of the right lower lobe pleural  surface which may be related to prior empyema or pleurodesis or sequela of prior asbestos exposure. Areas of calcified pleural plaques are so noted in the left upper lobe anteriorly. There are bibasilar subpleural scarring with reticulation. Areas of confluent and streaky densities in the right lower lobe may represent superimposed pneumonia. Clinical correlation is recommended. Trace pleural effusion in the left fissure versus pleural thickening. No large pleural effusion or pneumothorax. The central airways are patent. Upper Abdomen: No acute abnormality. Musculoskeletal: Degenerative changes of the spine. No acute osseous pathology. Review of the MIP images confirms the above findings. IMPRESSION: 1. No large central pulmonary artery embolus. Areas of low density within the segmental pulmonary arteries primarily involving the left upper lobe may be related to suboptimal opacification or represent nonocclusive thrombus or scarring related to old PE's. V/Q scan may provide better evaluation if there is high clinical concern for acute PE. 2. Patchy and streaky right lung base density may represent pneumonia superimposed on background of chronic changes and scarring. 3. Cardiomegaly with a large pericardial effusion. Further evaluation with echocardiogram recommended. 4. Calcified pleural plaques primarily involving the right lung may be sequela of prior asbestos exposure or empyema. Electronically Signed   By: Elgie Collard M.D.   On: 10/14/2019 22:45   CT Cervical Spine Wo Contrast  Result Date: 10/14/2019 CLINICAL DATA:  Headache, posttraumatic. Poly trauma, critical, head/cervical spine injury suspected. Additional history provided: Patient son reports that he fell yesterday. EXAM: CT HEAD WITHOUT CONTRAST CT CERVICAL SPINE WITHOUT CONTRAST TECHNIQUE: Multidetector CT imaging of the head and cervical spine was performed following the standard protocol without intravenous contrast. Multiplanar CT image  reconstructions of the cervical spine were also generated. COMPARISON:  CT head/cervical spine 02/29/2016 FINDINGS: CT HEAD FINDINGS Brain: No evidence of acute intracranial hemorrhage. No demarcated cortical infarction. No evidence of intracranial mass. No midline shift or extra-axial fluid collection. Ill-defined hypoattenuation within the cerebral white matter and pons, progressed from prior examination and nonspecific, but consistent with chronic small vessel ischemic disease. Moderate generalized parenchymal atrophy. Vascular: No hyperdense vessel.  Atherosclerotic calcifications. Skull: Normal. Negative for fracture or focal lesion. Sinuses/Orbits: Visualized orbits demonstrate no acute abnormality. No significant paranasal sinus disease or mastoid effusion at the imaged levels. Other: Probable small left frontal forehead/periorbital hematoma. CT CERVICAL SPINE FINDINGS Alignment: Straightening of the expected cervical lordosis. No significant spondylolisthesis. Skull base and vertebrae: The basion-dental and atlanto-dental intervals are maintained.No evidence of acute fracture to the cervical spine. A mild T2 superior endplate compression deformity is  new as compared to CT 02/28/2016, but otherwise age indeterminate. Soft tissues and spinal canal: No prevertebral fluid or swelling. No visible canal hematoma. Bilateral thyroid lobe nodules. The largest nodule within the right lobe measures 2.1 cm (series 5, image 77). Disc levels: Cervical spondylosis with multilevel disc height loss, posterior disc osteophytes, uncovertebral and facet hypertrophy. Severe disc height loss with degenerative fusion across the C5-C6 disc space. There is also degenerative fusion across the posterior disc space at C4-C5 and possibly at C6-C7. Degenerative fusion of the facet joints on the left at C3-C4. Upper chest: No consolidation within the imaged lung apices. No visible pneumothorax. IMPRESSION: CT head: 1. No evidence of acute  intracranial abnormality. 2. Chronic small vessel ischemic disease, progressed as compared to prior examination 02/28/2016. 3. Moderate generalized parenchymal atrophy. CT cervical spine: 1. A mild T2 vertebral body superior endplate compression fracture is new as compared to prior CT 02/28/2016, but otherwise age indeterminate. 2. No evidence of acute fracture to the cervical spine. 3. Cervical spondylosis with sites of degenerative fusion as described. 4. Thyroid nodules, the largest measuring 2.1 cm within the right lobe. Nonemergent thyroid ultrasound is recommended for further evaluation. Electronically Signed   By: Jackey Loge DO   On: 10/14/2019 12:57   VAS [place] LOWER EXTREMITY VENOUS (DVT)  Result Date: 10/15/2019  Lower Venous Study Indications: Elevated d-dimer.  Comparison Study: No prior study. Performing Technologist: Gertie Fey MHA, RDMS, RVT, RDCS  Examination Guidelines: A complete evaluation includes B-mode imaging, spectral Doppler, color Doppler, and power Doppler as needed of all accessible portions of each vessel. Bilateral testing is considered an integral part of a complete examination. Limited examinations for reoccurring indications may be performed as noted.  +---------+---------------+---------+-----------+----------+--------------+ RIGHT    CompressibilityPhasicitySpontaneityPropertiesThrombus Aging +---------+---------------+---------+-----------+----------+--------------+ CFV      Full           Yes      Yes                                 +---------+---------------+---------+-----------+----------+--------------+ SFJ      Full                                                        +---------+---------------+---------+-----------+----------+--------------+ FV Prox  Full                                                        +---------+---------------+---------+-----------+----------+--------------+ FV Mid   Full                                                         +---------+---------------+---------+-----------+----------+--------------+ FV DistalFull                                                        +---------+---------------+---------+-----------+----------+--------------+  PFV      Full                                                        +---------+---------------+---------+-----------+----------+--------------+ POP      Full           Yes      Yes                                 +---------+---------------+---------+-----------+----------+--------------+ PTV      Full                                                        +---------+---------------+---------+-----------+----------+--------------+ PERO     Full                                                        +---------+---------------+---------+-----------+----------+--------------+   +---------+---------------+---------+-----------+----------+-----------------+ LEFT     CompressibilityPhasicitySpontaneityPropertiesThrombus Aging    +---------+---------------+---------+-----------+----------+-----------------+ CFV      Full           Yes      Yes                                    +---------+---------------+---------+-----------+----------+-----------------+ SFJ      Full                                                           +---------+---------------+---------+-----------+----------+-----------------+ FV Prox  Full                                                           +---------+---------------+---------+-----------+----------+-----------------+ FV Mid   Full                                                           +---------+---------------+---------+-----------+----------+-----------------+ FV DistalFull                                                           +---------+---------------+---------+-----------+----------+-----------------+ PFV      Full                                                            +---------+---------------+---------+-----------+----------+-----------------+  POP      Full           Yes      Yes                                    +---------+---------------+---------+-----------+----------+-----------------+ PTV      None                    No                   Age Indeterminate +---------+---------------+---------+-----------+----------+-----------------+ PERO     Full                                                           +---------+---------------+---------+-----------+----------+-----------------+     Summary: Right: There is no evidence of deep vein thrombosis in the lower extremity. No cystic structure found in the popliteal fossa. Left: Findings consistent with age indeterminate deep vein thrombosis involving the left posterior tibial veins. No cystic structure found in the popliteal fossa.  *See table(s) above for measurements and observations. Electronically signed by Harold Barban MD on 10/15/2019 at 2:04:53 PM.    Final      Subjective:   Patient was seen and examined 10/16/2019, 9:16 AM Patient stable today. No acute distress.  No issues overnight Stable for discharge.  Discharge Exam:    Vitals:   10/15/19 1946 10/16/19 0349 10/16/19 0700 10/16/19 0813  BP: 114/60 124/80 126/78   Pulse: 67 (!) 59 60 (!) 58  Resp: 14 17 16 16   Temp: 98.8 F (37.1 C) 98.8 F (37.1 C) 97.8 F (36.6 C)   TempSrc:      SpO2: 96% 100% 100% 95%    General: Pt lying comfortably in bed not following command Cardiovascular: S1 & S2 heard, RRR, S1/S2 +. No murmurs, rubs, gallops or clicks. No JVD or pedal edema. Respiratory: Clear to auscultation without wheezing, rhonchi or crackles. No increased work of breathing. Abdominal:  Non-distended, non-tender & soft. No organomegaly or masses appreciated. Normal bowel sounds heard. CNS: Remains encephalopathic, not following any command,  extremities: no edema, no cyanosis    The results  of significant diagnostics from this hospitalization (including imaging, microbiology, ancillary and laboratory) are listed below for reference.      Microbiology:   Recent Results (from the past 240 hour(s))  SARS CORONAVIRUS 2 (TAT 6-24 HRS) Nasopharyngeal Nasopharyngeal Swab     Status: None   Collection Time: 10/14/19  3:29 PM   Specimen: Nasopharyngeal Swab  Result Value Ref Range Status   SARS Coronavirus 2 NEGATIVE NEGATIVE Final    Comment: (NOTE) SARS-CoV-2 target nucleic acids are NOT DETECTED. The SARS-CoV-2 RNA is generally detectable in upper and lower respiratory specimens during the acute phase of infection. Negative results do not preclude SARS-CoV-2 infection, do not rule out co-infections with other pathogens, and should not be used as the sole basis for treatment or other patient management decisions. Negative results must be combined with clinical observations, patient history, and epidemiological information. The expected result is Negative. Fact Sheet for Patients: SugarRoll.be Fact Sheet for Healthcare Providers: https://www.woods-mathews.com/ This test is not yet approved or cleared by the [place] and  has been authorized  for detection and/or diagnosis of SARS-CoV-2 by FDA under an Emergency Use Authorization (EUA). This EUA will remain  in effect (meaning this test can be used) for the duration of the COVID-19 declaration under Section 56 4(b)(1) of the Act, 21 U.S.C. section 360bbb-3(b)(1), unless the authorization is terminated or revoked sooner. Performed at [facility], [address], [place], [place] [address]      Labs:   CBC: Recent Labs  Lab 10/14/19 1430 10/14/19 2000 10/15/19 0947  WBC 10.0 11.4* 9.0  NEUTROABS 8.1*  --   --   HGB 12.9* 13.6 12.5*  HCT 39.6 40.7 37.9*  MCV 94.7 92.7 93.3  PLT 150 148* 126*   Basic Metabolic Panel: Recent Labs  Lab 10/14/19 1430  10/14/19 2000 10/15/19 0947  NA 141  --  138  K 4.4  --  3.9  CL 106  --  108  CO2 26  --  22  GLUCOSE 105*  --  88  BUN 18  --  16  CREATININE 0.70 0.79 0.74  CALCIUM 8.8*  --  8.1*   Liver Function Tests: No results for input(s): AST, ALT, ALKPHOS, BILITOT, PROT, ALBUMIN in the last 168 hours. BNP (last 3 results) No results for input(s): BNP in the last 8760 hours. Cardiac Enzymes: Recent Labs  Lab 10/14/19 1430 10/15/19 0947  CKTOTAL 1,596* 763*   Anemia work up Recent Labs    10/15/19 0947  VITAMINB12 1,046*   Urinalysis    Component Value Date/Time   COLORURINE YELLOW 10/15/2019 0140   APPEARANCEUR CLEAR 10/15/2019 0140   LABSPEC 1.020 10/15/2019 0140   PHURINE 7.0 10/15/2019 0140   GLUCOSEU NEGATIVE 10/15/2019 0140   HGBUR NEGATIVE 10/15/2019 0140   BILIRUBINUR NEGATIVE 10/15/2019 0140   KETONESUR 20 (A) 10/15/2019 0140   PROTEINUR NEGATIVE 10/15/2019 0140   UROBILINOGEN 1.0 05/13/2009 1824   NITRITE NEGATIVE 10/15/2019 0140   LEUKOCYTESUR NEGATIVE 10/15/2019 0140         Time coordinating discharge: Over 45 minutes  SIGNED: Kendell Bane, MD, FACP, FHM. [facility],  Pager [phone]  If 7PM-7AM, please contact night-coverage [email or web address] 10/16/2019, 9:16 AM

## 2019-10-16 NOTE — TOC Transition Note (Addendum)
Transition of Care [facility]) - CM/SW Discharge Note   Patient Details  Name: [name] MRN: [id number] Date of Birth: December 21, 1930  Transition of Care [facility]) CM/SW Contact:  Epifanio Lesches, RN Phone Number: 10/16/2019, 9:11 AM   Clinical Narrative:    Patient will DC to: Home with family Anticipated DC date: 10/16/2019 Family notified: Ed (son), Ed stated will ensure family knows Transport by: Sharin Mons   Per MD patient ready for DC today with hospice care to follow, [facility]. RN, patient, patient's family aware of d/c plan. Ambulance transport requested for patient.   RNCM will sign off for now as intervention is no longer needed. Please consult [place] again if new needs arise.  Eber Ferrufino [facility])    [phone]       Final next level of care: Home w Hospice Care Barriers to Discharge: No Barriers Identified   Patient Goals and CMS Choice        Discharge Placement                       Discharge Plan and Services        Social Determinants of Health (SDOH) Interventions     Readmission Risk Interventions No flowsheet data found.

## 2019-11-03 DEATH — deceased

## 2019-12-10 ENCOUNTER — Telehealth: Payer: Self-pay | Admitting: Family Medicine

## 2019-12-10 NOTE — Telephone Encounter (Signed)
I am so very sorry to hear of his passing. Thank you for letting me know.

## 2019-12-10 NOTE — Telephone Encounter (Signed)
Frimpong,Steven(son) called to cxl pt's appointment.  Pt passed away 27-Oct-2019

## 2019-12-10 NOTE — Telephone Encounter (Signed)
See below

## 2019-12-11 NOTE — Telephone Encounter (Signed)
Sympathy card will be mailed today.

## 2019-12-31 ENCOUNTER — Ambulatory Visit: Payer: Medicare Other | Admitting: Family Medicine
